# Patient Record
Sex: Male | Born: 1935
Health system: Southern US, Community
[De-identification: ages and names within clinical notes are randomized; demographics above are authoritative.]

## PROBLEM LIST (undated history)

## (undated) DIAGNOSIS — F329 Major depressive disorder, single episode, unspecified: Secondary | ICD-10-CM

## (undated) DIAGNOSIS — IMO0001 Reserved for inherently not codable concepts without codable children: Secondary | ICD-10-CM

## (undated) DIAGNOSIS — N400 Enlarged prostate without lower urinary tract symptoms: Secondary | ICD-10-CM

## (undated) DIAGNOSIS — G4700 Insomnia, unspecified: Secondary | ICD-10-CM

## (undated) DIAGNOSIS — C439 Malignant melanoma of skin, unspecified: Secondary | ICD-10-CM

## (undated) DIAGNOSIS — I10 Essential (primary) hypertension: Secondary | ICD-10-CM

## (undated) DIAGNOSIS — M1990 Unspecified osteoarthritis, unspecified site: Secondary | ICD-10-CM

## (undated) DIAGNOSIS — K219 Gastro-esophageal reflux disease without esophagitis: Secondary | ICD-10-CM

## (undated) DIAGNOSIS — G250 Essential tremor: Secondary | ICD-10-CM

## (undated) HISTORY — DX: Malignant melanoma of skin, unspecified: C43.9

## (undated) HISTORY — DX: Reserved for inherently not codable concepts without codable children: IMO0001

## (undated) HISTORY — DX: Insomnia, unspecified: G47.00

## (undated) HISTORY — DX: Major depressive disorder, single episode, unspecified: F32.9

## (undated) HISTORY — DX: Essential tremor: G25.0

## (undated) HISTORY — DX: Essential (primary) hypertension: I10

## (undated) HISTORY — DX: Gastro-esophageal reflux disease without esophagitis: K21.9

## (undated) HISTORY — DX: Unspecified osteoarthritis, unspecified site: M19.90

## (undated) HISTORY — DX: Benign prostatic hyperplasia without lower urinary tract symptoms: N40.0

---

## 1994-11-28 DIAGNOSIS — N400 Enlarged prostate without lower urinary tract symptoms: Secondary | ICD-10-CM

## 1994-11-28 DIAGNOSIS — F32A Depression, unspecified: Secondary | ICD-10-CM

## 1994-11-28 HISTORY — DX: Depression, unspecified: F32.A

## 1994-11-28 HISTORY — DX: Benign prostatic hyperplasia without lower urinary tract symptoms: N40.0

## 1999-10-05 LAB — HM COLONOSCOPY

## 2000-06-27 ENCOUNTER — Encounter: Payer: Self-pay | Admitting: Family Medicine

## 2000-06-27 LAB — CONVERTED CEMR LAB
PSA: 1.4 ng/mL
RBC count: 4.83 10*6/uL
TSH: 2.12 microintl units/mL
WBC, blood: 4.9 10*3/uL

## 2001-07-04 ENCOUNTER — Encounter: Payer: Self-pay | Admitting: Family Medicine

## 2001-07-04 LAB — CONVERTED CEMR LAB: TSH: 1.57 microintl units/mL

## 2001-11-16 ENCOUNTER — Encounter: Payer: Self-pay | Admitting: Family Medicine

## 2001-11-19 ENCOUNTER — Encounter: Payer: Self-pay | Admitting: Family Medicine

## 2001-11-28 HISTORY — PX: CATARACT EXTRACTION: SUR2

## 2002-07-04 ENCOUNTER — Encounter: Payer: Self-pay | Admitting: Family Medicine

## 2002-07-04 LAB — CONVERTED CEMR LAB
Blood Glucose, Fasting: 85 mg/dL
RBC count: 4.52 10*6/uL
WBC, blood: 4 10*3/uL

## 2003-03-26 ENCOUNTER — Encounter: Admission: RE | Admit: 2003-03-26 | Discharge: 2003-03-26 | Payer: Self-pay | Admitting: Internal Medicine

## 2003-03-26 ENCOUNTER — Encounter: Payer: Self-pay | Admitting: Internal Medicine

## 2004-11-11 ENCOUNTER — Ambulatory Visit: Payer: Self-pay | Admitting: Family Medicine

## 2004-12-08 ENCOUNTER — Ambulatory Visit: Payer: Self-pay | Admitting: Family Medicine

## 2004-12-08 LAB — CONVERTED CEMR LAB
PSA: 1.39 ng/mL
TSH: 2.07 microintl units/mL

## 2004-12-10 ENCOUNTER — Ambulatory Visit: Payer: Self-pay | Admitting: Family Medicine

## 2004-12-24 ENCOUNTER — Ambulatory Visit: Payer: Self-pay | Admitting: Family Medicine

## 2005-02-28 ENCOUNTER — Ambulatory Visit: Payer: Self-pay | Admitting: Family Medicine

## 2005-09-29 ENCOUNTER — Ambulatory Visit: Payer: Self-pay | Admitting: Family Medicine

## 2005-12-15 ENCOUNTER — Ambulatory Visit: Payer: Self-pay | Admitting: Family Medicine

## 2005-12-16 ENCOUNTER — Ambulatory Visit: Payer: Self-pay | Admitting: Family Medicine

## 2005-12-16 LAB — CONVERTED CEMR LAB
Blood Glucose, Fasting: 99 mg/dL
TSH: 1.61 microintl units/mL

## 2005-12-22 ENCOUNTER — Ambulatory Visit: Payer: Self-pay | Admitting: Family Medicine

## 2006-01-03 ENCOUNTER — Ambulatory Visit: Payer: Self-pay | Admitting: Family Medicine

## 2006-03-09 ENCOUNTER — Ambulatory Visit: Payer: Self-pay | Admitting: Family Medicine

## 2006-03-09 LAB — CONVERTED CEMR LAB
Blood Glucose, Fasting: 91 mg/dL
PSA: 1.73 ng/mL
TSH: 1.56 microintl units/mL

## 2006-12-13 ENCOUNTER — Ambulatory Visit: Payer: Self-pay | Admitting: Family Medicine

## 2006-12-13 LAB — CONVERTED CEMR LAB
ALT: 22 units/L (ref 0–40)
Alkaline Phosphatase: 62 units/L (ref 39–117)
CO2: 27 meq/L (ref 19–32)
Calcium: 9.1 mg/dL (ref 8.4–10.5)
Chloride: 106 meq/L (ref 96–112)
Cholesterol: 185 mg/dL (ref 0–200)
Glucose, Bld: 102 mg/dL — ABNORMAL HIGH (ref 70–99)
HDL: 50.4 mg/dL (ref >39.0)
LDL Cholesterol: 117 mg/dL — ABNORMAL HIGH (ref 0–99)
PSA: 2.31 ng/mL
PSA: 2.31 ng/mL (ref 0.10–4.00)
Potassium: 4 meq/L (ref 3.5–5.1)
Sodium: 141 meq/L (ref 135–145)
TSH: 1.51 microintl units/mL
TSH: 1.51 microintl units/mL (ref 0.35–5.50)
Total CHOL/HDL Ratio: 3.7
VLDL: 18 mg/dL (ref 0–40)

## 2006-12-21 ENCOUNTER — Ambulatory Visit: Payer: Self-pay | Admitting: Family Medicine

## 2006-12-25 ENCOUNTER — Ambulatory Visit: Payer: Self-pay | Admitting: Family Medicine

## 2007-01-02 ENCOUNTER — Ambulatory Visit: Payer: Self-pay | Admitting: Family Medicine

## 2007-03-22 ENCOUNTER — Ambulatory Visit: Payer: Self-pay | Admitting: Family Medicine

## 2007-04-11 ENCOUNTER — Telehealth (INDEPENDENT_AMBULATORY_CARE_PROVIDER_SITE_OTHER): Payer: Self-pay | Admitting: *Deleted

## 2007-08-30 ENCOUNTER — Ambulatory Visit: Payer: Self-pay | Admitting: Family Medicine

## 2007-11-01 ENCOUNTER — Telehealth: Payer: Self-pay | Admitting: Family Medicine

## 2007-11-12 ENCOUNTER — Telehealth: Payer: Self-pay | Admitting: Family Medicine

## 2007-12-11 ENCOUNTER — Ambulatory Visit: Payer: Self-pay | Admitting: Family Medicine

## 2007-12-11 DIAGNOSIS — F411 Generalized anxiety disorder: Secondary | ICD-10-CM

## 2007-12-21 ENCOUNTER — Encounter: Payer: Self-pay | Admitting: Family Medicine

## 2007-12-21 DIAGNOSIS — R7309 Other abnormal glucose: Secondary | ICD-10-CM

## 2007-12-24 DIAGNOSIS — N400 Enlarged prostate without lower urinary tract symptoms: Secondary | ICD-10-CM | POA: Insufficient documentation

## 2007-12-24 DIAGNOSIS — I10 Essential (primary) hypertension: Secondary | ICD-10-CM | POA: Insufficient documentation

## 2007-12-24 DIAGNOSIS — F39 Unspecified mood [affective] disorder: Secondary | ICD-10-CM | POA: Insufficient documentation

## 2007-12-26 ENCOUNTER — Ambulatory Visit: Payer: Self-pay | Admitting: Family Medicine

## 2007-12-26 LAB — CONVERTED CEMR LAB
ALT: 22 units/L (ref 0–53)
Albumin: 4 g/dL (ref 3.5–5.2)
Alkaline Phosphatase: 65 units/L (ref 39–117)
BUN: 20 mg/dL (ref 6–23)
CO2: 28 meq/L (ref 19–32)
Calcium: 9.4 mg/dL (ref 8.4–10.5)
Direct LDL: 117 mg/dL
GFR calc Af Amer: 84 mL/min
HDL: 62.8 mg/dL (ref >39.0)
Potassium: 4.6 meq/L (ref 3.5–5.1)
TSH: 2.31 microintl units/mL (ref 0.35–5.50)
Total Protein: 6.5 g/dL (ref 6.0–8.3)
Triglycerides: 49 mg/dL (ref 0–149)
VLDL: 10 mg/dL (ref 0–40)

## 2008-01-01 ENCOUNTER — Ambulatory Visit: Payer: Self-pay | Admitting: Family Medicine

## 2008-01-01 DIAGNOSIS — G250 Essential tremor: Secondary | ICD-10-CM | POA: Insufficient documentation

## 2008-01-17 ENCOUNTER — Ambulatory Visit: Payer: Self-pay | Admitting: Family Medicine

## 2008-01-18 ENCOUNTER — Encounter (INDEPENDENT_AMBULATORY_CARE_PROVIDER_SITE_OTHER): Payer: Self-pay | Admitting: *Deleted

## 2008-01-29 ENCOUNTER — Telehealth: Payer: Self-pay | Admitting: Family Medicine

## 2008-01-30 ENCOUNTER — Ambulatory Visit: Payer: Self-pay | Admitting: Family Medicine

## 2008-01-30 LAB — CONVERTED CEMR LAB
Basophils Relative: 0.5 % (ref 0.0–1.0)
Eosinophils Relative: 3.4 % (ref 0.0–5.0)
HCT: 43.5 % (ref 39.0–52.0)
Hemoglobin: 14.5 g/dL (ref 13.0–17.0)
Iron: 140 ug/dL (ref 42–165)
Lymphocytes Relative: 28.4 % (ref 12.0–46.0)
Monocytes Absolute: 0.4 10*3/uL (ref 0.2–0.7)
Neutro Abs: 2.2 10*3/uL (ref 1.4–7.7)
Neutrophils Relative %: 56.7 % (ref 43.0–77.0)
RDW: 12.7 % (ref 11.5–14.6)
WBC: 3.8 10*3/uL — ABNORMAL LOW (ref 4.5–10.5)

## 2008-02-11 ENCOUNTER — Telehealth: Payer: Self-pay | Admitting: Family Medicine

## 2008-02-27 ENCOUNTER — Telehealth: Payer: Self-pay | Admitting: Family Medicine

## 2008-04-24 ENCOUNTER — Ambulatory Visit: Payer: Self-pay | Admitting: Family Medicine

## 2008-05-06 ENCOUNTER — Telehealth: Payer: Self-pay | Admitting: Family Medicine

## 2008-08-27 ENCOUNTER — Telehealth: Payer: Self-pay | Admitting: Family Medicine

## 2008-08-27 ENCOUNTER — Ambulatory Visit: Payer: Self-pay | Admitting: Family Medicine

## 2008-12-04 ENCOUNTER — Ambulatory Visit: Payer: Self-pay | Admitting: Family Medicine

## 2008-12-16 ENCOUNTER — Telehealth: Payer: Self-pay | Admitting: Family Medicine

## 2008-12-29 ENCOUNTER — Ambulatory Visit: Payer: Self-pay | Admitting: Family Medicine

## 2008-12-29 DIAGNOSIS — T7840XA Allergy, unspecified, initial encounter: Secondary | ICD-10-CM | POA: Insufficient documentation

## 2009-01-01 ENCOUNTER — Ambulatory Visit: Payer: Self-pay | Admitting: Family Medicine

## 2009-01-02 LAB — CONVERTED CEMR LAB
ALT: 26 units/L (ref 0–53)
AST: 22 units/L (ref 0–37)
Basophils Absolute: 0 10*3/uL (ref 0.0–0.1)
Basophils Relative: 0.2 % (ref 0.0–3.0)
Bilirubin, Direct: 0.1 mg/dL (ref 0.0–0.3)
CO2: 0 meq/L — CL (ref 19–32)
Chloride: 117 meq/L — ABNORMAL HIGH (ref 96–112)
Creatinine, Ser: 1.1 mg/dL (ref 0.4–1.5)
Glucose, Bld: 100 mg/dL — ABNORMAL HIGH (ref 70–99)
HDL: 45.4 mg/dL (ref >39.0)
Hemoglobin: 13.6 g/dL (ref 13.0–17.0)
Lymphocytes Relative: 23.6 % (ref 12.0–46.0)
MCHC: 34.6 g/dL (ref 30.0–36.0)
Monocytes Relative: 11.9 % (ref 3.0–12.0)
Neutrophils Relative %: 60.2 % (ref 43.0–77.0)
PSA: 2.17 ng/mL (ref 0.10–4.00)
RBC: 4.18 M/uL — ABNORMAL LOW (ref 4.22–5.81)
Sodium: 147 meq/L — ABNORMAL HIGH (ref 135–145)
TSH: 1.16 microintl units/mL (ref 0.35–5.50)
Total Bilirubin: 0.9 mg/dL (ref 0.3–1.2)
Total CHOL/HDL Ratio: 4.6
Total Protein: 6.3 g/dL (ref 6.0–8.3)
VLDL: 25 mg/dL (ref 0–40)

## 2009-01-08 ENCOUNTER — Ambulatory Visit: Payer: Self-pay | Admitting: Family Medicine

## 2009-01-15 ENCOUNTER — Ambulatory Visit: Payer: Self-pay | Admitting: Family Medicine

## 2009-01-15 ENCOUNTER — Encounter (INDEPENDENT_AMBULATORY_CARE_PROVIDER_SITE_OTHER): Payer: Self-pay | Admitting: *Deleted

## 2009-01-15 LAB — CONVERTED CEMR LAB
OCCULT 1: NEGATIVE
OCCULT 2: NEGATIVE
OCCULT 3: NEGATIVE

## 2009-01-15 LAB — FECAL OCCULT BLOOD, GUAIAC: Fecal Occult Blood: NEGATIVE

## 2009-02-11 ENCOUNTER — Telehealth: Payer: Self-pay | Admitting: Family Medicine

## 2009-02-12 ENCOUNTER — Telehealth: Payer: Self-pay | Admitting: Family Medicine

## 2009-02-19 ENCOUNTER — Ambulatory Visit: Payer: Self-pay | Admitting: Family Medicine

## 2009-03-24 ENCOUNTER — Ambulatory Visit: Payer: Self-pay | Admitting: Family Medicine

## 2009-05-20 ENCOUNTER — Ambulatory Visit: Payer: Self-pay | Admitting: Family Medicine

## 2009-05-20 DIAGNOSIS — G4700 Insomnia, unspecified: Secondary | ICD-10-CM | POA: Insufficient documentation

## 2009-05-20 DIAGNOSIS — K219 Gastro-esophageal reflux disease without esophagitis: Secondary | ICD-10-CM | POA: Insufficient documentation

## 2009-05-20 DIAGNOSIS — R55 Syncope and collapse: Secondary | ICD-10-CM | POA: Insufficient documentation

## 2009-05-20 HISTORY — DX: Insomnia, unspecified: G47.00

## 2009-06-03 ENCOUNTER — Ambulatory Visit: Payer: Self-pay | Admitting: Family Medicine

## 2009-06-18 ENCOUNTER — Ambulatory Visit: Payer: Self-pay | Admitting: Family Medicine

## 2009-07-21 ENCOUNTER — Ambulatory Visit: Payer: Self-pay | Admitting: Family Medicine

## 2009-07-21 ENCOUNTER — Telehealth: Payer: Self-pay | Admitting: Family Medicine

## 2009-08-19 ENCOUNTER — Ambulatory Visit: Payer: Self-pay | Admitting: Family Medicine

## 2009-08-21 ENCOUNTER — Telehealth (INDEPENDENT_AMBULATORY_CARE_PROVIDER_SITE_OTHER): Payer: Self-pay

## 2009-08-24 ENCOUNTER — Encounter: Payer: Self-pay | Admitting: Family Medicine

## 2009-09-18 ENCOUNTER — Telehealth: Payer: Self-pay | Admitting: Internal Medicine

## 2009-11-19 ENCOUNTER — Telehealth: Payer: Self-pay | Admitting: Family Medicine

## 2009-12-27 ENCOUNTER — Emergency Department: Payer: Self-pay | Admitting: Emergency Medicine

## 2009-12-28 ENCOUNTER — Telehealth: Payer: Self-pay | Admitting: Family Medicine

## 2010-01-18 ENCOUNTER — Telehealth: Payer: Self-pay | Admitting: Family Medicine

## 2010-03-02 ENCOUNTER — Telehealth: Payer: Self-pay | Admitting: Family Medicine

## 2010-03-30 ENCOUNTER — Ambulatory Visit: Payer: Self-pay | Admitting: Family Medicine

## 2010-03-30 LAB — CONVERTED CEMR LAB
ALT: 17 units/L (ref 0–53)
AST: 18 units/L (ref 0–37)
Alkaline Phosphatase: 65 units/L (ref 39–117)
BUN: 21 mg/dL (ref 6–23)
Bilirubin, Direct: 0.1 mg/dL (ref 0.0–0.3)
Calcium: 9 mg/dL (ref 8.4–10.5)
Cholesterol: 210 mg/dL — ABNORMAL HIGH (ref 0–200)
Creatinine, Ser: 0.9 mg/dL (ref 0.4–1.5)
Creatinine,U: 148.1 mg/dL
Eosinophils Relative: 3.9 % (ref 0.0–5.0)
GFR calc non Af Amer: 86.98 mL/min (ref >60)
Lymphocytes Relative: 27.9 % (ref 12.0–46.0)
Monocytes Absolute: 0.5 10*3/uL (ref 0.1–1.0)
Monocytes Relative: 12.1 % — ABNORMAL HIGH (ref 3.0–12.0)
Neutrophils Relative %: 55.3 % (ref 43.0–77.0)
Platelets: 231 10*3/uL (ref 150.0–400.0)
Total Bilirubin: 0.7 mg/dL (ref 0.3–1.2)
Total CHOL/HDL Ratio: 4
VLDL: 21.4 mg/dL (ref 0.0–40.0)
WBC: 4.5 10*3/uL (ref 4.5–10.5)

## 2010-04-01 ENCOUNTER — Ambulatory Visit: Payer: Self-pay | Admitting: Family Medicine

## 2010-05-03 ENCOUNTER — Ambulatory Visit: Payer: Self-pay | Admitting: Family Medicine

## 2010-06-08 ENCOUNTER — Ambulatory Visit: Payer: Self-pay | Admitting: Internal Medicine

## 2010-06-11 ENCOUNTER — Telehealth: Payer: Self-pay | Admitting: Family Medicine

## 2010-06-24 ENCOUNTER — Telehealth: Payer: Self-pay | Admitting: Internal Medicine

## 2010-07-01 ENCOUNTER — Encounter: Payer: Self-pay | Admitting: Internal Medicine

## 2010-07-06 ENCOUNTER — Encounter (INDEPENDENT_AMBULATORY_CARE_PROVIDER_SITE_OTHER): Payer: Self-pay | Admitting: *Deleted

## 2010-07-12 ENCOUNTER — Ambulatory Visit: Payer: Self-pay | Admitting: Internal Medicine

## 2010-07-12 DIAGNOSIS — M720 Palmar fascial fibromatosis [Dupuytren]: Secondary | ICD-10-CM

## 2010-08-31 ENCOUNTER — Ambulatory Visit: Payer: Self-pay | Admitting: Internal Medicine

## 2010-12-10 ENCOUNTER — Ambulatory Visit: Admit: 2010-12-10 | Payer: Self-pay | Admitting: Internal Medicine
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
## 2010-12-28 NOTE — Progress Notes (Signed)
Summary: refill request for alprazolam  Phone Note Refill Request Message from:  Fax from Pharmacy  Refills Requested: Medication #1:  alprazolam 0.5 mg Faxed request from [facility], this is no longer on med list.  [phone].  Initial call taken by: Lowella Petties CMA,  March 02, 2010 8:54 AM  Follow-up for Phone Call        Called to [place]. Follow-up by: Lowella Petties CMA,  March 02, 2010 12:09 PM    New/Updated Medications: ALPRAZOLAM 0.5 MG TABS (ALPRAZOLAM) one tab by mouth twice a day sparingly as needed. Prescriptions: ALPRAZOLAM 0.5 MG TABS (ALPRAZOLAM) one tab by mouth twice a day sparingly as needed.  #30 x 0   Entered and Authorized by:   Shaune Leeks MD   Signed by:   Lowella Petties CMA on 03/02/2010   Method used:   Telephoned to ...       [facility]* (retail)       [address]       [place], [place]  [address]       Ph: [phone]       Fax: [phone]   RxID:   [id number]

## 2010-12-28 NOTE — Progress Notes (Signed)
Summary: needs written script for mail order  Phone Note Refill Request Call back at Home Phone [phone] Message from:  Patient  Refills Requested: Medication #1:  FEXOFENADINE HCL 180 MG TABS one tab by mouth once daily.. Pt is asking for a 90 day written script to send to mail order, please call when ready.  Initial call taken by: Lowella Petties CMA,  January 18, 2010 12:01 PM  Follow-up for Phone Call        [facility] advising pt ok to pick up script. Follow-up by: Lowella Petties CMA,  January 18, 2010 2:12 PM    Prescriptions: FEXOFENADINE HCL 180 MG TABS (FEXOFENADINE HCL) one tab by mouth once daily.  #90 x 3   Entered and Authorized by:   Shaune Leeks MD   Signed by:   Shaune Leeks MD on 01/18/2010   Method used:   Print then Give to Patient   RxID:   [id number]

## 2010-12-28 NOTE — Letter (Signed)
Summary: Nadara Eaton letter  [place] at [facility]  [address] [place], [place] [address]   Phone: [phone]  Fax: [phone]       07/06/2010 MRN: [id number]  [facility] [place] [address] [place], [place]  [address]  Dear Mr. Toft,  [facility] [place], and [place] announce the retirement of Arta Silence, M.D., from full-time practice at the [facility] office effective May 27, 2010 and his plans of returning part-time.  It is important to Dr. Hetty Ely and to our practice that you understand that [facility] has seven physicians in our office for your health care needs.  We will continue to offer the same exceptional care that you have today.    Dr. Hetty Ely has spoken to many of you about his plans for retirement and returning part-time in the fall.   We will continue to work with you through the transition to schedule appointments for you in the office and meet the high standards that [facility] is committed to.   Again, it is with great pleasure that we share the news that Dr. Hetty Ely will return to [facility] in October of 2011 with a reduced schedule.    If you have any questions, or would like to request an appointment with one of our physicians, please call us at [phone] and press the option for Scheduling an appointment.  We take pleasure in providing you with excellent patient care and look forward to seeing you at your next office visit.  Our [facility] Physicians are:  Tillman Abide, M.D. Laurita Quint, M.D. Roxy Manns, M.D. Kerby Nora, M.D. Hannah Beat, M.D. Ruthe Mannan, M.D. We proudly welcomed Raechel Ache, M.D. and Eustaquio Boyden, M.D. to the practice in July/August 2011.  Sincerely,   [facility]

## 2010-12-28 NOTE — Assessment & Plan Note (Signed)
Summary: skin growth on shoulder/dlo   Vital Signs:  Patient profile:   77 year old male Weight:      143.50 pounds Temp:     97.7 degrees F oral Pulse rate:   72 / minute Pulse rhythm:   regular BP sitting:   100 / 60  (left arm) Cuff size:   regular  Vitals Entered By: Sydell Axon LPN (May 03, 864 2:44 PM) CC: Check skin growth on right shoulder   History of Present Illness: Here to check a lesion that broke out on the right shoulder at the very top  mid clavicular line that came up  a few days ago. He scraped off a scab and it bled. It is now getting more nml appearing again.  Problems Prior to Update: 1)  Health Maintenance Exam  (ICD-V70.0) 2)  Finger Sprain, R Little  (ICD-842.10) 3)  Gerd  (ICD-530.81) 4)  Insomnia, Chronic  (ICD-307.42) 5)  Syncope  (ICD-780.2) 6)  Allergy  (ICD-995.3) 7)  Special Screening Malig Neoplasms Other Sites  (ICD-V76.49) 8)  Tremor, Essential  (ICD-333.1) 9)  Special Screening Malignant Neoplasm of Prostate  (ICD-V76.44) 10)  Other Abnormal Glucose  (ICD-790.29) 11)  Hypercholesterolemia  (ICD-272.0) 12)  Benign Prostatic Hypertrophy  (ICD-600.00) 13)  Hypertension  (ICD-401.9) 14)  Depression  (ICD-311) 15)  Seasonal Affective Disorder  (ICD-296.90) 16)  Anxiety  (ICD-300.00)  Medications Prior to Update: 1)  Paroxetine Hcl 30 Mg Tabs (Paroxetine Hcl) .... Take 1  Tablet By Mouth Single Dose Daily By Mouth 2)  Adult Aspirin Low Strength 81 Mg  Tbdp (Aspirin) .[name].. 1 Daily 3)  Fish Oil 1000 Mg  Caps (Omega-3 Fatty Acids) .[name].. 1 Daily 4)  Multivitamins   Tabs (Multiple Vitamin) .[name].. 1 Daily 5)  Patanol 0.1 % Soln (Olopatadine Hcl) .... One Drop Eaqch Eye Twice Daily 6-8hrs Apart. 6)  Propranolol Hcl 20 Mg Tabs (Propranolol Hcl) .... One Tab By Mouth Two Times A Day 7)  Melatonin 5 Mg Tabs (Melatonin) .... Use As Needed As Needed 8)  Cardura 1 Mg Tabs (Doxazosin Mesylate) .... One Tab By Mouth Once Daily 9)  Fexofenadine Hcl 180 Mg  Tabs (Fexofenadine Hcl) .... One Tab By Mouth Once Daily. 10)  Alprazolam 0.5 Mg Tabs (Alprazolam) .... One Tab By Mouth Twice A Day Sparingly As Needed.  Allergies: No Known Drug Allergies  Physical Exam  Skin:  Maculopapular double lesion with resolving abraded appearance approx 2mm x 5mm.    Impression & Recommendations:  Problem # 1:  NEOPLASM, SKIN, UNCERTAIN BEHAVIOR (ICD-238.2) Assessment New Of top of mid right shoulder area midclavicular line...allow a few mos to heal. If still there by Oct, make appt for excision.  Complete Medication List: 1)  Paroxetine Hcl 30 Mg Tabs (Paroxetine hcl) .... Take 1  tablet by mouth single dose daily by mouth 2)  Adult Aspirin Low Strength 81 Mg Tbdp (Aspirin) .[name].. 1 daily 3)  Fish Oil 1000 Mg Caps (Omega-3 fatty acids) .[name].. 1 daily 4)  Patanol 0.1 % Soln (Olopatadine hcl) .... One drop eaqch eye twice daily 6-8hrs apart. 5)  Propranolol Hcl 20 Mg Tabs (Propranolol hcl) .... One tab by mouth two times a day 6)  Melatonin 5 Mg Tabs (Melatonin) .... Use as needed as needed 7)  Cardura 1 Mg Tabs (Doxazosin mesylate) .... One tab by mouth once daily 8)  Fexofenadine Hcl 180 Mg Tabs (Fexofenadine hcl) .... One tab by mouth once daily. 9)  Alprazolam  0.5 Mg Tabs (Alprazolam) .... One tab by mouth twice a day sparingly as needed.  Current Allergies (reviewed today): No known allergies

## 2010-12-28 NOTE — Progress Notes (Signed)
Summary: regarding mysoline  Phone Note Call from Patient Call back at Home Phone [phone]   Caller: Patient Summary of Call: Pt is taking one half of a mysoline tablet at bedtime.  The tremors have come back- they stopped when he started this.  Also, he feels some light headedness.  He is asking if he should increase the mysoline dose, or is there something else that he can try.  [facility]. Initial call taken by: Lowella Petties CMA,  June 24, 2010 3:52 PM  Follow-up for Phone Call        Message left. Need to clarify this note.  Is his tremor better on the 1/2 mysoline? If not, I would increase it to the full tab daily unless he is having trouble taking it. Occ it can cause some dizziness that will often improve over a few days Cindee Salt MD  June 25, 2010 7:55 AM   Additional Follow-up for Phone Call Additional follow up Details #1::        While he was taking the propranolol the tremors were controlled, when he started the mysoline they were still controlled, but this week they came back.  Advised pt he can increase dose, but he is asking if he should take a whole one at once or take one half twice a day.  Lowella Petties CMA  June 25, 2010 11:10 AM  [facility] Janee Morn CMA  June 25, 2010 1:09 PM  probably better to try 1/2 two times a day as long as it doesn't sedate him Cindee Salt MD  June 25, 2010 12:22 PM      Additional Follow-up for Phone Call Additional follow up Details #2::    Advised pt.         Lowella Petties CMA  June 25, 2010 3:51 PM

## 2010-12-28 NOTE — Assessment & Plan Note (Signed)
Summary: flu/[place]/alc  Nurse Visit   Allergies: No Known Drug Allergies  Orders Added: 1)  Flu Vaccine 18yrs + MEDICARE PATIENTS [Q2039] 2)  Administration Flu vaccine - MCR [G0008]   Flu Vaccine Consent Questions     Do you have a history of severe allergic reactions to this vaccine? no    Any prior history of allergic reactions to egg and/or gelatin? no    Do you have a sensitivity to the preservative Thimersol? no    Do you have a past history of Guillan-Barre Syndrome? no    Do you currently have an acute febrile illness? no    Have you ever had a severe reaction to latex? no    Vaccine information given and explained to patient? yes    Are you currently pregnant? no    Lot Number:[id number]   Exp Date:05/28/2011   Site Given  Left Deltoid IM

## 2010-12-28 NOTE — Therapy (Signed)
[facility]: [facility]   Imported By: Lanelle Bal 07/20/2010 14:01:49  _____________________________________________________________________  External Attachment:    Type:   Image     Comment:   External Document  Appended Document: [facility] recommended amplification to help hearing and mask tinnitus

## 2010-12-28 NOTE — Progress Notes (Signed)
Summary: needs something for tremors  Phone Note Call from Patient Call back at Home Phone [phone]   Caller: Patient Summary of Call: Pt was seen on 7/12.  He was told to stop his propranolol because of night time hallucinations and call if his tremors got worse.  His nights are better but the tremors have come back full force.  Dr Alphonsus Sias had mentioned in his note from that day that he would prescribe mysoline if the tremors did come back.  Pt requests that this be called to [facility]. Initial call taken by: Lowella Petties CMA,  June 11, 2010 3:56 PM    New/Updated Medications: MYSOLINE 50 MG TABS (PRIMIDONE) 1/2 tab by mouth qhs Prescriptions: MYSOLINE 50 MG TABS (PRIMIDONE) 1/2 tab by mouth qhs  #30 x 0   Entered and Authorized by:   Ruthe Mannan MD   Signed by:   Ruthe Mannan MD on 06/11/2010   Method used:   Electronically to        [facility]* (retail)       [address]       [place], [place]  [address]       Ph: [phone]       Fax: [phone]   RxID:   [id number]   Appended Document: needs something for tremors Patient advised as instructed via telephone.

## 2010-12-28 NOTE — Assessment & Plan Note (Signed)
Summary: CPX/DLO   Vital Signs:  Patient profile:   77 year old male Weight:      143.25 pounds BMI:     22.86 Temp:     98.3 degrees F oral Pulse rate:   64 / minute Pulse rhythm:   regular BP sitting:   110 / 78  (left arm) Cuff size:   regular  Vitals Entered By: Sydell Axon LPN (Apr 02, 3663 2:03 PM) CC: 30 Minute checkup, had a colonoscopy 11/00   History of Present Illness: Pt here for Comp Exam. His probs are allergies for which he uses Patanol. He then had a spoarained right little finger which is contracting.   Preventive Screening-Counseling & Management  Alcohol-Tobacco     Alcohol drinks/day: 2     Alcohol type: wine x2 at night     Smoking Status: never     Passive Smoke Exposure: no  Caffeine-Diet-Exercise     Caffeine use/day: 0     Does Patient Exercise: yes     Type of exercise: walks     Times/week: 5  Problems Prior to Update: 1)  Finger Sprain, R Little  (ICD-842.10) 2)  Gerd  (ICD-530.81) 3)  Insomnia, Chronic  (ICD-307.42) 4)  Syncope  (ICD-780.2) 5)  Allergy  (ICD-995.3) 6)  Special Screening Malig Neoplasms Other Sites  (ICD-V76.49) 7)  Tremor, Essential  (ICD-333.1) 8)  Special Screening Malignant Neoplasm of Prostate  (ICD-V76.44) 9)  Other Abnormal Glucose  (ICD-790.29) 10)  Hypercholesterolemia  (ICD-272.0) 11)  Benign Prostatic Hypertrophy  (ICD-600.00) 12)  Hypertension  (ICD-401.9) 13)  Depression  (ICD-311) 14)  Seasonal Affective Disorder  (ICD-296.90) 15)  Anxiety  (ICD-300.00)  Medications Prior to Update: 1)  Paroxetine Hcl 30 Mg Tabs (Paroxetine Hcl) .... Take 1  Tablet By Mouth Single Dose Daily By Mouth 2)  Adult Aspirin Low Strength 81 Mg  Tbdp (Aspirin) .[name].. 1 Daily 3)  Fish Oil 1000 Mg  Caps (Omega-3 Fatty Acids) .[name].. 1 Daily 4)  Multivitamins   Tabs (Multiple Vitamin) .[name].. 1 Daily 5)  Patanol 0.1 % Soln (Olopatadine Hcl) .... One Drop Eaqch Eye Twice Daily 6-8hrs Apart. 6)  Nasonex 50 Mcg/act Susp (Mometasone  Furoate) .... One Inhalation Each Nostril Two Times A Day As Needed 7)  Zyrtec Allergy 10 Mg Tabs (Cetirizine Hcl) .[name].. 1 Daily As Needed By Mouth 8)  Propranolol Hcl 20 Mg Tabs (Propranolol Hcl) .... One Tab By Mouth Two Times A Day 9)  Melatonin 5 Mg Tabs (Melatonin) .... Use As Needed As Needed 10)  Vitamin C 1000 Mg Tabs (Ascorbic Acid) .[name].. 1 Daily By Mouth 11)  Procapanol  10mg  .... For Lowering Bad Cholesterol 12)  Cardura 1 Mg Tabs (Doxazosin Mesylate) .... One Tab By Mouth Once Daily 13)  Doxazosin Mesylate 2 Mg Tabs (Doxazosin Mesylate) .[name].. 1 Tab At Bedtime 14)  Fexofenadine Hcl 180 Mg Tabs (Fexofenadine Hcl) .... One Tab By Mouth Once Daily. 15)  Alprazolam 0.5 Mg Tabs (Alprazolam) .... One Tab By Mouth Twice A Day Sparingly As Needed.  Allergies: No Known Drug Allergies  Past History:  Past Medical History: Last updated: 12/21/2007 Depression::(1996) Hypertension:(1996) Benign prostatic hypertrophy:(1996)  Past Surgical History: Last updated: 01/08/2009 PSYCH ADMIT ([place]) DEPRESSION 1996 PSYCHIATRIC ADMIT: SELF REFERRAL// ANXIETY AND DEPRESSION:(12/1994) COLONOSCOPY// SECOND TO HEMATOCHEZIA// REPORTEDLY NORMAL:(09/1999) ETT-- ABNORMAL III AVF AND LAT. LEADS:(09/20000) STRESS CARDIOLITE:(07/1999) CATARRACT EXTRACTION (DR Nelle Don) (2003) ABDOMENAL ULTRASOUND , CAROTID ULTRASOUND; ABI'S (SELF DONE) NORMAL:(05/27/2002)  Family History: Last updated:  12/21/2007 Father: DECEASED AT 83 YOA CANCER OF STOMACH Mother: DECEASED 73 YOA ALZHEIMERS// DEPRESSION // LONG HISTORY Siblings: NONE CV: NEGATIVE HBP: + SELF DM: NEGATIVE GOUT/ARTHRITIS: PROSTATE CANCER: + STOMACH FATHER BREAST/OVARIAN/UTERINE CANCER: NEGATIVE COLON CANCER: + STOMACH FATHER DEPRESSION: + SELF + MOTHER ETOH/DRUG ABUSE: NEGATIVE OTHER : NEGATIVE STROKE  Social History: Last updated: 12/21/2007 Marital Status: Married LIVES WITH WIFE Children: 3 BOYS Occupation: RETIRED   DISABILITY//PARTIME JOBS WORKING AT [employer] NOW// DISABLITY LEFT INDEX   Risk Factors: Alcohol Use: 2 (04/01/2010) Caffeine Use: 0 (04/01/2010) Exercise: yes (04/01/2010)  Risk Factors: Smoking Status: never (04/01/2010) Passive Smoke Exposure: no (04/01/2010)  Review of Systems General:  Denies chills, fatigue, fever, sweats, weakness, and weight loss. Eyes:  Denies blurring, discharge, and eye pain; allergies. ENT:  Complains of ringing in ears; denies decreased hearing and ear discharge; hissing. CV:  Denies chest pain or discomfort, fainting, fatigue, palpitations, shortness of breath with exertion, swelling of feet, and swelling of hands. Resp:  Denies cough, shortness of breath, and wheezing. GI:  Denies abdominal pain, bloody stools, change in bowel habits, constipation, dark tarry stools, diarrhea, indigestion, loss of appetite, nausea, vomiting, vomiting blood, and yellowish skin color. GU:  Denies discharge, dysuria, nocturia, and urinary frequency; chronic prostatitis, occas symptomatic. Derm:  Denies dryness, itching, and rash. Neuro:  Complains of tremors; denies numbness, poor balance, and tingling.  Physical Exam  General:  Well-developed,well-nourished,in no acute distress; alert,appropriate and cooperative throughout examination Head:  Normocephalic and atraumatic without obvious abnormalities. No apparent alopecia or balding. Sinuses NT. Eyes:  Conjunctiva clear bilaterally.  Ears:  External ear exam shows no significant lesions or deformities.  Otoscopic examination reveals clear canals, tympanic membranes are intact bilaterally without bulging, retraction, inflammation or discharge. Hearing is grossly normal bilaterally. Nose:  External nasal examination shows no deformity or inflammation. Nasal mucosa are pink and moist without lesions or exudates. Mouth:  Oral mucosa and oropharynx without lesions or exudates.  Teeth in reasonable repair. Neck:  No  deformities, masses, or tenderness noted. Chest Wall:  No deformities, masses, tenderness or gynecomastia noted. Breasts:  No masses or gynecomastia noted Lungs:  Normal respiratory effort, chest expands symmetrically. Lungs are clear to auscultation, no crackles or wheezes. Heart:  Normal rate and regular rhythm. S1 and S2 normal without gallop, murmur, click, rub or other extra sounds. Abdomen:  Bowel sounds positive,abdomen soft and non-tender without masses, organomegaly or hernias noted. Rectal:  No external abnormalities noted. Normal sphincter tone. No rectal masses or tenderness. G neg. Genitalia:  Testes bilaterally descended without nodularity, tenderness or masses. No scrotal masses or lesions. No penis lesions or urethral discharge. Prostate:  Prostate gland firm and smooth, no enlargement, nodularity, tenderness, mass, asymmetry or induration. 10-20 gms. Msk:  No deformity or scoliosis noted of thoracic or lumbar spine.   Pulses:  R and L carotid,radial,femoral,dorsalis pedis and posterior tibial pulses are full and equal bilaterally Extremities:  No clubbing, cyanosis, edema, or deformity noted with normal full range of motion of all joints except mild flexion deformity of right little finger PIP joint. Neurologic:  No cranial nerve deficits noted. Station and gait are normal. Plantar reflexes are down-going bilaterally. DTRs are symmetrical throughout. Sensory, motor and coordinative functions appear intact. Skin:  Intact without suspicious lesions or rashes Cervical Nodes:  No lymphadenopathy noted Inguinal Nodes:  No significant adenopathy Psych:  Cognition and judgment appear intact. Alert and cooperative with normal attention span and concentration. No apparent delusions, illusions, hallucinations  Impression & Recommendations:  Problem # 1:  HEALTH MAINTENANCE EXAM (ICD-V70.0) Assessment Comment Only  Reviewed preventive care protocols, scheduled due services, and updated  immunizations.  Problem # 2:  FINGER SPRAIN, R LITTLE (ICD-842.10) Assessment: Unchanged Discussed self physical therapy aggressively.  Problem # 3:  GERD (ICD-530.81) Assessment: Unchanged  Stable.  Diagnostics Reviewed:  Discussed lifestyle modifications, diet, antacids/medications, and preventive measures. Handout provided.   Problem # 4:  INSOMNIA, CHRONIC (ICD-307.42) Assessment: Unchanged Well controlled.  Problem # 5:  ALLERGY (ICD-995.3) Assessment: Unchanged Basically affects his eyes. Uses Patanol with good results.  Problem # 6:  TREMOR, ESSENTIAL (ICD-333.1) Assessment: Unchanged Stable on Propranolol.  Problem # 7:  OTHER ABNORMAL GLUCOSE (ICD-790.29) Assessment: Unchanged  Euglycemic today.  Labs Reviewed: Creat: 0.9 (03/30/2010)     Problem # 8:  HYPERCHOLESTEROLEMIA (ICD-272.0) Adequate. Try to watch fatty food intake. Labs Reviewed: SGOT: 18 (03/30/2010)   SGPT: 17 (03/30/2010)   HDL:47.90 (03/30/2010), 45.4 (01/01/2009)  LDL:DEL (01/01/2009), DEL (12/26/2007)  Chol:210 (03/30/2010), 208 (01/01/2009)  Trig:107.0 (03/30/2010), 127 (01/01/2009)  Problem # 9:  BENIGN PROSTATIC HYPERTROPHY (ICD-600.00) Assessment: Unchanged Stable PSA and exam. Sxs stable.  Problem # 10:  HYPERTENSION (ICD-401.9) Assessment: Unchanged Well controlled. The following medications were removed from the medication list:    Doxazosin Mesylate 2 Mg Tabs (Doxazosin mesylate) .[name]... 1 tab at bedtime His updated medication list for this problem includes:    Propranolol Hcl 20 Mg Tabs (Propranolol hcl) ..... One tab by mouth two times a day    Cardura 1 Mg Tabs (Doxazosin mesylate) ..... One tab by mouth once daily  BP today: 110/78 Prior BP: 118/78 (08/19/2009)  Labs Reviewed: K+: 4.8 (03/30/2010) Creat: : 0.9 (03/30/2010)   Chol: 210 (03/30/2010)   HDL: 47.90 (03/30/2010)   LDL: DEL (01/01/2009)   TG: 107.0 (03/30/2010)  Problem # 11:  SEASONAL AFFECTIVE DISORDER  (ICD-296.90) Assessment: Unchanged Well controlled No problems this winter. Discussed continued trmt.  Complete Medication List: 1)  Paroxetine Hcl 30 Mg Tabs (Paroxetine hcl) .... Take 1  tablet by mouth single dose daily by mouth 2)  Adult Aspirin Low Strength 81 Mg Tbdp (Aspirin) .[name].. 1 daily 3)  Fish Oil 1000 Mg Caps (Omega-3 fatty acids) .[name].. 1 daily 4)  Multivitamins Tabs (Multiple vitamin) .[name].. 1 daily 5)  Patanol 0.1 % Soln (Olopatadine hcl) .... One drop eaqch eye twice daily 6-8hrs apart. 6)  Propranolol Hcl 20 Mg Tabs (Propranolol hcl) .... One tab by mouth two times a day 7)  Melatonin 5 Mg Tabs (Melatonin) .... Use as needed as needed 8)  Cardura 1 Mg Tabs (Doxazosin mesylate) .... One tab by mouth once daily 9)  Fexofenadine Hcl 180 Mg Tabs (Fexofenadine hcl) .... One tab by mouth once daily. 10)  Alprazolam 0.5 Mg Tabs (Alprazolam) .... One tab by mouth twice a day sparingly as needed.  Patient Instructions: 1)  RTC as needed O/W one year. 2)  Discuss pneumovax next appt.  Current Allergies (reviewed today): No known allergies

## 2010-12-28 NOTE — Assessment & Plan Note (Signed)
Summary: CHECK HANDS   Vital Signs:  Patient profile:   77 year old male Weight:      144 pounds Temp:     98.0 degrees F oral Pulse rate:   68 / minute Pulse rhythm:   regular BP sitting:   108 / 60  (left arm) Cuff size:   large  Vitals Entered By: Mervin Hack CMA Duncan Dull) (July 12, 2010 11:24 AM) CC: check hands   History of Present Illness: Tried the mysoline didn't seem to help the tremor--- and made him very edgy Has gone back on propranolol two times a day  Discussed the sustained release--had been on this in the past  Mood has been good Clearly has a seasonal component---often just takes 15mg  of paroxetine in summer, then back to full 30 mg in the winter  concerned about Duputryen's disease in hands Got information online and from friend 4th finger on left, 5th on right No functional problems    Preventive Screening-Counseling & Management  Alcohol-Tobacco     Smoking Status: never  Allergies: No Known Drug Allergies  Past History:  Past medical, surgical, family and social histories (including risk factors) reviewed for relevance to current acute and chronic problems.  Past Medical History: Reviewed history from 06/08/2010 and no changes required. Depression::(1996) Hypertension:(1996) Benign prostatic hypertrophy:(1996) Essential tremor  Past Surgical History: Reviewed history from 01/08/2009 and no changes required. PS[place]CH ADMIT ([place]) DEPRESSION 1996 PSYCHIATRIC ADMIT: SELF REFERRAL// ANXIETY AND DEPRESSION:(12/1994) COLONOSCOPY// SECOND TO HEMATOCHEZIA// REPORTEDLY NORMAL:(09/1999) ETT-- ABNORMAL III AVF AND LAT. LEADS:(09/20000) STRESS CARDIOLITE:(07/1999) CATARRACT EXTRACTION (DR Nelle Don) (2003) ABDOMENAL ULTRASOUND , CAROTID ULTRASOUND; ABI'S (SELF DONE) NORMAL:(05/27/2002)  Family History: Reviewed history from 12/21/2007 and no changes required. Father: DECEASED AT 42 YOA CANCER OF STOMACH Mother: DECEASED 63 YOA ALZHEIMERS//  DEPRESSION // LONG HISTORY Siblings: NONE CV: NEGATIVE HBP: + SELF DM: NEGATIVE GOUT/ARTHRITIS: PROSTATE CANCER: + STOMACH FATHER BREAST/OVARIAN/UTERINE CANCER: NEGATIVE COLON CANCER: + STOMACH FATHER DEPRESSION: + SELF + MOTHER ETOH/DRUG ABUSE: NEGATIVE OTHER : NEGATIVE STROKE  Social History: Reviewed history from 12/21/2007 and no changes required. Retired--Insurance Programmer, systems. Then worked at [employer] course for a while Married---3 sons Never Smoked Alcohol use-occ wine  Has living will. Wife, the son Dorinda Hill is his health care POA. Would accept resuscitation attempts but no prolonged ventilation. Would consider feeding tube  Review of Systems       sleep is still not great---occ uses OTC med voiding okay  Physical Exam  General:  alert and normal appearance.   Msk:  moderate contracture at PIP on right 5th finger Lacks considerable extension Good grip Has flexor tendon nodule  left 4th finger with very slight contracture only Psych:  normally interactive, good eye contact, not anxious appearing, and not depressed appearing.     Impression & Recommendations:  Problem # 1:  DUPUYTREN'S CONTRACTURE (ICD-728.6) Assessment New little to no functional problems will defer referral wants Dr Amanda Pea if things get worse  Problem # 2:  TREMOR, ESSENTIAL (ICD-333.1) Assessment: Comment Only mysoline didn't help and was not tolerable will go back to propranolol but use the extended release  Problem # 3:  TINNITUS (ICD-388.30) Assessment: Comment Only was given vitamins to take he is forgoing this due to the $40 monthly cost  Problem # 4:  DEPRESSION (ICD-311) Assessment: Unchanged stable on meds  His updated medication list for this problem includes:    Paroxetine Hcl 30 Mg Tabs (Paroxetine hcl) .[name]... Take 1  by mouth once daily  Complete Medication List: 1)  Paroxetine Hcl 30 Mg Tabs (Paroxetine hcl) .... Take 1  by mouth once daily 2)  Cardura 1 Mg  Tabs (Doxazosin mesylate) .... One tab by mouth once daily 3)  Fexofenadine Hcl 180 Mg Tabs (Fexofenadine hcl) .... One tab by mouth once daily. 4)  Adult Aspirin Low Strength 81 Mg Tbdp (Aspirin) .... Take 1 by mouth once daily 5)  Fish Oil 1000 Mg Caps (Omega-3 fatty acids) .... Take 1 by mouth once daily 6)  Patanol 0.1 % Soln (Olopatadine hcl) .... One drop each eye twice daily every 6-8hrs apart. 7)  Propranolol Hcl Cr 60 Mg Xr24h-cap (Propranolol hcl) .[name].. 1 tab daily to control tremors  Patient Instructions: 1)  Please schedule a follow-up appointment in 6 months for physical Prescriptions: PROPRANOLOL HCL CR 60 MG XR24H-CAP (PROPRANOLOL HCL) 1 tab daily to control tremors  #30 x 12   Entered and Authorized by:   Cindee Salt MD   Signed by:   Cindee Salt MD on 07/12/2010   Method used:   Electronically to        [facility]* (retail)       [address]       [place], [place]  [address]       Ph: [phone]       Fax: [phone]   RxID:   [id number]   Current Allergies (reviewed today): No known allergies

## 2010-12-28 NOTE — Progress Notes (Signed)
Summary: Pt went to [facility] ER for laceration  Phone Note Call from Patient Call back at [phone]   Caller: Patient Call For: Shaune Leeks MD Summary of Call: Pt went to [facility] ER on Sunday night 12/27/09 with laceration of finger. Pt is OK but was told because of his insurance had to notify his physician he went to ER.   Initial call taken by: Lewanda Rife LPN,  December 28, 2009 5:42 PM  Follow-up for Phone Call        If he has sutures or needs followup, I imagine he should come here. Follow-up by: Shaune Leeks MD,  December 28, 2009 6:01 PM  Additional Follow-up for Phone Call Additional follow up Details #1::        Spoke to patient and was informed that he did not have sutures and was not told to follow-up with his PCP. Patient just wanted this noted on his records that he called to advise that he went to the ER. Additional Follow-up by: Sydell Axon LPN,  December 28, 2009 6:04 PM

## 2011-01-24 ENCOUNTER — Telehealth: Payer: Self-pay | Admitting: Internal Medicine

## 2011-01-25 ENCOUNTER — Telehealth: Payer: Self-pay | Admitting: Family Medicine

## 2011-02-03 NOTE — Progress Notes (Signed)
Summary: needs new written scripts for mail order  Phone Note Refill Request Call back at Home Phone [phone] Message from:  Patient  Refills Requested: Medication #1:  PAROXETINE HCL 30 MG TABS Take 1  by mouth once daily  Medication #2:  CARDURA 1 MG TABS one tab by mouth once daily  Medication #3:  PROPRANOLOL HCL CR 60 MG XR24H-CAP 1 tab daily to control tremors. Pt needs written 90 day scripts for new mail order.  Please call when ready.  Initial call taken by: Lowella Petties CMA, AAMA,  January 24, 2011 10:56 AM  Follow-up for Phone Call        Rx written Follow-up by: Cindee Salt MD,  January 24, 2011 1:22 PM  Additional Follow-up for Phone Call Additional follow up Details #1::        left message on machine that rx ready for pick-up  Additional Follow-up by: DeShannon Smith CMA Duncan Dull),  January 24, 2011 2:27 PM    New/Updated Medications: PAROXETINE HCL 30 MG TABS (PAROXETINE HCL) Take 1  by mouth once daily CARDURA 1 MG TABS (DOXAZOSIN MESYLATE) one tab by mouth once daily Prescriptions: PROPRANOLOL HCL CR 60 MG XR24H-CAP (PROPRANOLOL HCL) 1 tab daily to control tremors  #90 x 3   Entered and Authorized by:   Cindee Salt MD   Signed by:   Cindee Salt MD on 01/24/2011   Method used:   Print then Give to Patient   RxID:   [id number] CARDURA 1 MG TABS (DOXAZOSIN MESYLATE) one tab by mouth once daily  #90 x 3   Entered and Authorized by:   Cindee Salt MD   Signed by:   Cindee Salt MD on 01/24/2011   Method used:   Print then Give to Patient   RxID:   [id number] PAROXETINE HCL 30 MG TABS (PAROXETINE HCL) Take 1  by mouth once daily  #90 x 3   Entered and Authorized by:   Cindee Salt MD   Signed by:   Cindee Salt MD on 01/24/2011   Method used:   Print then Give to Patient   RxID:   [id number]   Appended Document: needs new written scripts for mail order Pt said was going to leave medicine at  the local pharmacy and does not want these rxs for mail order.  Appended Document: needs new written scripts for mail order calling patient to see if he wants rx's sent locally? left message on machine at home for patient to return my call.

## 2011-02-03 NOTE — Progress Notes (Signed)
Summary: Patient upset   Phone Note Outgoing Call   Call placed by: Mervin Hack CMA Duncan Dull),  January 25, 2011 3:36 PM Call placed to: Patient Summary of Call: called pt to followup on an append sent by Lewanda Rife, pt had requested 90day rx for his medications, which was done and I left message for the pt to let him know they were ready for pick-up. Today Rena stated pt doesn't want mail order rx's , he just get them locally because Dr.Tower will not refill his wife's [place] for mail-order. Per pt (who was very upset) stated Dr.Tower won't filll rx because it can be stolen out the mailbox and then we would be liable? pt states mail order saves them alot of money and it doesn't make sense that Dr.Tower won't fill, then pt asked that Remus Loffler be sent locally and with refills, and again his wife was denied. I advised pt I could still send his rx's to [facility] but pt asked that I mail them to him because he's not set up with them yet, this would be their 1st order so they need hard copies. Rx's put in the mail today for pt. Initial call taken by: Mervin Hack CMA Duncan Dull),  January 25, 2011 3:45 PM  Follow-up for Phone Call        not sure what I can do  Let Dr Milinda Antis know of his concerns Follow-up by: Cindee Salt MD,  January 25, 2011 5:28 PM

## 2011-02-05 ENCOUNTER — Encounter: Payer: Self-pay | Admitting: Internal Medicine

## 2011-04-04 ENCOUNTER — Ambulatory Visit (INDEPENDENT_AMBULATORY_CARE_PROVIDER_SITE_OTHER): Payer: Medicare Other | Admitting: Internal Medicine

## 2011-04-04 ENCOUNTER — Encounter: Payer: Self-pay | Admitting: Internal Medicine

## 2011-04-04 VITALS — BP 131/69 | HR 55 | Temp 97.8°F | Ht 66.0 in | Wt 139.0 lb

## 2011-04-04 DIAGNOSIS — Z0000 Encounter for general adult medical examination without abnormal findings: Secondary | ICD-10-CM

## 2011-04-04 DIAGNOSIS — G252 Other specified forms of tremor: Secondary | ICD-10-CM

## 2011-04-04 DIAGNOSIS — G250 Essential tremor: Secondary | ICD-10-CM

## 2011-04-04 DIAGNOSIS — F411 Generalized anxiety disorder: Secondary | ICD-10-CM

## 2011-04-04 DIAGNOSIS — E7800 Pure hypercholesterolemia, unspecified: Secondary | ICD-10-CM

## 2011-04-04 DIAGNOSIS — F39 Unspecified mood [affective] disorder: Secondary | ICD-10-CM

## 2011-04-04 DIAGNOSIS — N400 Enlarged prostate without lower urinary tract symptoms: Secondary | ICD-10-CM

## 2011-04-04 DIAGNOSIS — T7840XA Allergy, unspecified, initial encounter: Secondary | ICD-10-CM

## 2011-04-04 DIAGNOSIS — I10 Essential (primary) hypertension: Secondary | ICD-10-CM

## 2011-04-04 LAB — CBC WITH DIFFERENTIAL/PLATELET
Basophils Relative: 0.8 % (ref 0.0–3.0)
Eosinophils Relative: 3.1 % (ref 0.0–5.0)
HCT: 39.2 % (ref 39.0–52.0)
Hemoglobin: 13.4 g/dL (ref 13.0–17.0)
Lymphs Abs: 1.2 10*3/uL (ref 0.7–4.0)
MCV: 94.8 fl (ref 78.0–100.0)
Monocytes Absolute: 0.6 10*3/uL (ref 0.1–1.0)
RBC: 4.14 Mil/uL — ABNORMAL LOW (ref 4.22–5.81)
WBC: 4.6 10*3/uL (ref 4.5–10.5)

## 2011-04-04 LAB — BASIC METABOLIC PANEL
BUN: 24 mg/dL — ABNORMAL HIGH (ref 6–23)
Calcium: 8.8 mg/dL (ref 8.4–10.5)
GFR: 87.88 mL/min (ref >60.00)
Glucose, Bld: 87 mg/dL (ref 70–99)
Potassium: 4 mEq/L (ref 3.5–5.1)

## 2011-04-04 LAB — HEPATIC FUNCTION PANEL: Total Bilirubin: 0.9 mg/dL (ref 0.3–1.2)

## 2011-04-04 MED ORDER — OLOPATADINE HCL 0.1 % OP SOLN: 1.0000 [drp] | Freq: Two times a day (BID) | OPHTHALMIC | Status: DC

## 2011-04-04 NOTE — Progress Notes (Signed)
Subjective:    Patient ID: Rodney Branch, male    DOB: April 30, 1931, 77 y.o.   MRN: [id number]  HPI Had allergy testing by Dr Nira Retort Corinda Gubler No apparent positive findings Recommended fexofenadine, patanol, flonase Got steroid shot IM and it really helped  No other active complaints  Still with tremor Not as bad while on the propranolol  Sold house and moved at wife's insistence --last November Now in townhome at [facility] Had trouble with mood then--but doing better now  Still bowls regularly, walks on treadmill, some weights  Current outpatient prescriptions:aspirin 81 MG tablet, Take 81 mg by mouth daily.  , Disp: , Rfl: ;  doxazosin (CARDURA) 1 MG tablet, Take 1 mg by mouth daily.  , Disp: , Rfl: ;  fexofenadine (ALLEGRA) 180 MG tablet, Take 180 mg by mouth daily.  , Disp: , Rfl: ;  fish oil-omega-3 fatty acids 1000 MG capsule, Take by mouth daily.  , Disp: , Rfl:  olopatadine (PATANOL) 0.1 % ophthalmic solution, Place 1 drop into both eyes 2 (two) times daily. One drop each eye twice daily every 6-8hours apart , Disp: , Rfl: ;  PARoxetine (PAXIL) 30 MG tablet, Take 30 mg by mouth daily.  , Disp: , Rfl: ;  propranolol (INDERAL LA) 60 MG 24 hr capsule, Take 60 mg by mouth daily. To control tremors , Disp: , Rfl:   Past Medical History  Diagnosis Date  . Depression 1996    Psych Admit ([place]) 1996  . Hypertension 1996  . Benign prostatic hypertrophy 1996  . Tremor, essential   . Psychiatric hospitalization 12/1994    self referral/anxiety depression    Past Surgical History  Procedure Date  . Cataract extraction 2003    Dr. Nelle Don    Family History  Problem Relation Age of Onset  . Depression Mother 64    alzheimers//long history  . Cancer Father 27    cancer of stomach  . Alzheimer's disease    . Alcohol abuse Neg Hx   . Diabetes Neg Hx   . Stroke Neg Hx     History   Social History  . Marital Status: Married    Spouse Name: N/A    Number of Children:  3  . Years of Education: N/A   Occupational History  . Biomedical engineer     Retired  . golf course     [employer] for a while  .     Social History Main Topics  . Smoking status: Never Smoker   . Smokeless tobacco: Never Used  . Alcohol Use: 0.0 oz/week     occasonally drinks wine  . Drug Use: No  . Sexually Active: Not on file   Other Topics Concern  . Not on file   Social History Narrative   Has living willWife, then son Dorinda Hill, is his health care POAWould accept resuscitation attempts but no prolonged ventilationWould consider feeding tube   Review of Systems  Constitutional: Negative for fatigue and unexpected weight change.       Retired from part time work at Systems analyst course Wears seat belt  HENT: Positive for hearing loss and tinnitus. Negative for dental problem.   Eyes: Negative for visual disturbance.       No diplopia or focal vision loss Occ gets sense of "small checkered flags" in his vision---if he is rushing around. Lasts 10 minutes  Respiratory: Negative for cough, chest tightness and shortness of breath.   Cardiovascular:  Negative for chest pain, palpitations and leg swelling.  Gastrointestinal: Negative for nausea, vomiting, abdominal pain, constipation and blood in stool.       Occ heartburn--uses tums prn and it helps (only with tomato products)  Genitourinary: Negative for dysuria, urgency, decreased urine volume and difficulty urinating.       No sex---marital thing  Musculoskeletal: Positive for arthralgias. Negative for back pain and joint swelling.       Right shoulder --saw Dr Thomasena Edis for injection  Skin: Negative for rash.       No suspicious areas  Neurological: Positive for tremors. Negative for dizziness, weakness, light-headedness, numbness and headaches.  Hematological: Negative for adenopathy. Does not bruise/bleed easily.  Psychiatric/Behavioral: Positive for sleep disturbance. Negative for dysphoric mood. The patient is not  nervous/anxious.        Chronic sleep problems---actually sleeps too long. Discussed       Objective:   Physical Exam  Constitutional: He is oriented to person, place, and time. He appears well-developed and well-nourished. No distress.  HENT:  Head: Normocephalic and atraumatic.  Right Ear: External ear normal.  Left Ear: External ear normal.  Mouth/Throat: Oropharynx is clear and moist. No oropharyngeal exudate.       TMs normal  Eyes: Conjunctivae and EOM are normal. Pupils are equal, round, and reactive to light.       Fundi benign  Neck: Normal range of motion. Neck supple. No thyromegaly present.  Cardiovascular: Normal rate, regular rhythm and intact distal pulses.  Exam reveals no gallop.   Murmur heard.      Gr2/6 late systolic murmur at apex (?diastolic)  Pulmonary/Chest: Effort normal and breath sounds normal. No respiratory distress. He has no wheezes. He has no rales.  Abdominal: Soft. He exhibits no mass. There is no tenderness.  Musculoskeletal: He exhibits no edema and no tenderness.       Decreased abduction of right shoulder  Lymphadenopathy:    He has no cervical adenopathy.  Neurological: He is alert and oriented to person, place, and time. He exhibits normal muscle tone.       Normal strength Very slight resting tremor in hands  Skin: Skin is warm. No rash noted.  Psychiatric: He has a normal mood and affect. His behavior is normal. Judgment and thought content normal.          Assessment & Plan:

## 2011-07-25 ENCOUNTER — Telehealth: Payer: Self-pay | Admitting: *Deleted

## 2011-07-25 MED ORDER — ZOLPIDEM TARTRATE 5 MG PO TABS: 5.0000 mg | ORAL_TABLET | Freq: Every evening | ORAL | Status: DC | PRN

## 2011-07-25 NOTE — Telephone Encounter (Signed)
Please make sure he knows not to take it every day----it stops working, can build up and cause side effects and is very dependence provoking. I expect this supply of 90 to last at least 6 months---I will not refill it earlier

## 2011-07-25 NOTE — Telephone Encounter (Signed)
Pt used to take zolpidem and has been taking otc sleep aids.  He would like to go back to zolpidem.  He takes his wife's from time to time and it works well.  He would like a written 90 day script. He will pick up when ready.

## 2011-07-25 NOTE — Telephone Encounter (Signed)
Spoke with patient and advised results   

## 2011-08-18 ENCOUNTER — Ambulatory Visit (INDEPENDENT_AMBULATORY_CARE_PROVIDER_SITE_OTHER): Payer: Medicare Other | Admitting: *Deleted

## 2011-08-18 DIAGNOSIS — Z23 Encounter for immunization: Secondary | ICD-10-CM

## 2011-09-22 ENCOUNTER — Ambulatory Visit (INDEPENDENT_AMBULATORY_CARE_PROVIDER_SITE_OTHER): Payer: Medicare Other | Admitting: Internal Medicine

## 2011-09-22 ENCOUNTER — Encounter: Payer: Self-pay | Admitting: Internal Medicine

## 2011-09-22 VITALS — BP 99/44 | HR 64 | Temp 98.0°F | Ht 66.0 in | Wt 143.0 lb

## 2011-09-22 DIAGNOSIS — H5316 Psychophysical visual disturbances: Secondary | ICD-10-CM

## 2011-09-22 DIAGNOSIS — Z79899 Other long term (current) drug therapy: Secondary | ICD-10-CM

## 2011-09-22 DIAGNOSIS — F39 Unspecified mood [affective] disorder: Secondary | ICD-10-CM

## 2011-09-22 DIAGNOSIS — F0390 Unspecified dementia without behavioral disturbance: Secondary | ICD-10-CM | POA: Insufficient documentation

## 2011-09-22 DIAGNOSIS — R441 Visual hallucinations: Secondary | ICD-10-CM

## 2011-09-22 DIAGNOSIS — G250 Essential tremor: Secondary | ICD-10-CM

## 2011-09-22 NOTE — Patient Instructions (Signed)
Call if you can't wait till the follow up visit to go back on something for your tremors

## 2011-09-22 NOTE — Assessment & Plan Note (Signed)
Having vivid dreams and some actual visual hallucinations at night I doubt this is propranolol but it is possible---will stop this  Discussed the possibility of Lewy body dementia Clearly has some degree of dementia--though mild

## 2011-09-22 NOTE — Assessment & Plan Note (Signed)
Ongoing problems Will try off the propranolol  If worsens, will restart the mysoline 50 daily or bid

## 2011-09-22 NOTE — Progress Notes (Signed)
  Subjective:    Patient ID: Rodney Branch, male    DOB: 07/05/1931, 77 y.o.   MRN: [id number]  HPI Wondering about the propranolol Concerned about depression, nightmares, hallucinations (actually vivid dreams---and people, bears, etc), dry mouth  Has stopped the zolpidem  Still has terrible shaking Trouble holding glass--even on the med Had been on primidone in past---just had some sedation with this  No sig dizziness   Some trouble with short term memory He pays all the bills No worrisome symptoms to suggest dementia  Ongoing mood swings Wonders about the propranolol for that as well Tries to spend time outside---finds his depression is worse without lots of sunlight  Current Outpatient Prescriptions on File Prior to Visit  Medication Sig Dispense Refill  . aspirin 81 MG tablet Take 81 mg by mouth daily.        [name] doxazosin (CARDURA) 1 MG tablet Take 1 mg by mouth daily.        . fexofenadine (ALLEGRA) 180 MG tablet Take 180 mg by mouth daily.        [name] PARoxetine (PAXIL) 30 MG tablet Take 30 mg by mouth daily.        . propranolol (INDERAL LA) 60 MG 24 hr capsule Take 60 mg by mouth daily. To control tremors         No Known Allergies  Past Medical History  Diagnosis Date  . Depression 1996    Psych Admit ([place]) 1996  . Hypertension 1996  . Benign prostatic hypertrophy 1996  . Tremor, essential   . Psychiatric hospitalization 12/1994    self referral/anxiety depression    Past Surgical History  Procedure Date  . Cataract extraction 2003    Dr. Nelle Don    Family History  Problem Relation Age of Onset  . Depression Mother 20    alzheimers//long history  . Cancer Father 71    cancer of stomach  . Alzheimer's disease    . Alcohol abuse Neg Hx   . Diabetes Neg Hx   . Stroke Neg Hx     History   Social History  . Marital Status: Married    Spouse Name: N/A    Number of Children: 3  . Years of Education: N/A   Occupational History  . Electronics engineer     Retired  . [employer]     retired also   Social History Main Topics  . Smoking status: Never Smoker   . Smokeless tobacco: Never Used  . Alcohol Use: 0.0 oz/week     occasonally drinks wine  . Drug Use: No  . Sexually Active: Not on file   Other Topics Concern  . Not on file   Social History Narrative   Has living willWife, then son Dorinda Hill, is his health care POAWould accept resuscitation attempts but no prolonged ventilationWould consider feeding tube   Review of Systems Appetite is fine Weight stable     Objective:   Physical Exam  Constitutional: He is oriented to person, place, and time.  Neurological: He is alert and oriented to person, place, and time.       President-- "[employer], [place]" [phone] [id number] Recall 0/3  Psychiatric: He has a normal mood and affect. His behavior is normal. Judgment and thought content normal.          Assessment & Plan:

## 2011-09-22 NOTE — Assessment & Plan Note (Signed)
Ongoing problems with this Will continue the paxil Good that he is off the zolpidem

## 2011-09-22 NOTE — Assessment & Plan Note (Signed)
Mild at most Will need to follow over time

## 2011-09-27 ENCOUNTER — Telehealth: Payer: Self-pay | Admitting: *Deleted

## 2011-09-27 NOTE — Telephone Encounter (Signed)
Actually, I was going to try it 25-50 daily to bid See if he wants to try this again and we will try at lowest reasonable dose

## 2011-09-27 NOTE — Telephone Encounter (Signed)
Patient is asking if there is another drug he can take for the tremors? Per pt he feels so much better being off the propranlol, but now the tremors are back. Please advise.

## 2011-09-27 NOTE — Telephone Encounter (Signed)
Probably the best choice is to try the mysoline (primodone) at low dose (like 1/2 or 1 of the 50mg  tab tid) again All the meds will have some risk of side effects

## 2011-09-28 NOTE — Telephone Encounter (Signed)
Yes he does want to try this again, please advise

## 2011-09-29 ENCOUNTER — Other Ambulatory Visit: Payer: Self-pay | Admitting: *Deleted

## 2011-09-29 MED ORDER — PRIMIDONE 50 MG PO TABS: 25.0000 mg | ORAL_TABLET | Freq: Two times a day (BID) | ORAL | Status: DC | PRN

## 2011-09-29 NOTE — Telephone Encounter (Signed)
Spoke with patient and advised results   

## 2011-09-29 NOTE — Telephone Encounter (Signed)
Okay--that is correct

## 2011-09-29 NOTE — Telephone Encounter (Signed)
Patient came in office this morning and wanted to get started on the rx so I sent in based on your instructions, 1/2 tab of the 50 mg once to twice daily, patient understood these instructions, I advised pt that if anything changes after I speak with Dr.Letvak I would call him back.

## 2011-10-05 ENCOUNTER — Other Ambulatory Visit: Payer: Self-pay | Admitting: *Deleted

## 2011-10-05 MED ORDER — DOXAZOSIN MESYLATE 1 MG PO TABS: 1.0000 mg | ORAL_TABLET | Freq: Every day | ORAL | Status: DC

## 2011-10-18 ENCOUNTER — Encounter: Payer: Self-pay | Admitting: Internal Medicine

## 2011-10-18 ENCOUNTER — Ambulatory Visit (INDEPENDENT_AMBULATORY_CARE_PROVIDER_SITE_OTHER): Payer: Medicare Other | Admitting: Internal Medicine

## 2011-10-18 DIAGNOSIS — F0390 Unspecified dementia without behavioral disturbance: Secondary | ICD-10-CM

## 2011-10-18 DIAGNOSIS — R441 Visual hallucinations: Secondary | ICD-10-CM

## 2011-10-18 DIAGNOSIS — H5316 Psychophysical visual disturbances: Secondary | ICD-10-CM

## 2011-10-18 DIAGNOSIS — G250 Essential tremor: Secondary | ICD-10-CM

## 2011-10-18 NOTE — Assessment & Plan Note (Signed)
Not worse and maybe better If early Lewy body---worsening cognitive or more hallucinations---will consider rivastigmine

## 2011-10-18 NOTE — Assessment & Plan Note (Signed)
Satisfied with the primidone

## 2011-10-18 NOTE — Progress Notes (Signed)
  Subjective:    Patient ID: Rodney Branch, male    DOB: Mar 02, 1931, 77 y.o.   MRN: [id number]  HPI Stopped the propranolol Using the primidone for tremor--this is giving reasonable relief of his tremors  Hasn't had any hallucinations since stopping the propranolol Memory may be slightly better but not strikingly  Mostly short term memory Clearly not worse per wife  Current Outpatient Prescriptions on File Prior to Visit  Medication Sig Dispense Refill  . aspirin 81 MG tablet Take 81 mg by mouth daily.        [name] doxazosin (CARDURA) 1 MG tablet Take 1 tablet (1 mg total) by mouth daily.  90 tablet  3  . fexofenadine (ALLEGRA) 180 MG tablet Take 180 mg by mouth daily.        [name] olopatadine (PATANOL) 0.1 % ophthalmic solution Place 1 drop into both eyes 2 (two) times daily.        [name] PARoxetine (PAXIL) 30 MG tablet Take 30 mg by mouth daily.        . primidone (MYSOLINE) 50 MG tablet Take 0.5 tablets (25 mg total) by mouth 2 (two) times daily as needed.  60 tablet  1    No Known Allergies  Past Medical History  Diagnosis Date  . Depression 1996    Psych Admit ([place]) 1996  . Hypertension 1996  . Benign prostatic hypertrophy 1996  . Tremor, essential   . Psychiatric hospitalization 12/1994    self referral/anxiety depression    Past Surgical History  Procedure Date  . Cataract extraction 2003    Dr. Nelle Don    Family History  Problem Relation Age of Onset  . Depression Mother 28    alzheimers//long history  . Cancer Father 50    cancer of stomach  . Alzheimer's disease    . Alcohol abuse Neg Hx   . Diabetes Neg Hx   . Stroke Neg Hx     History   Social History  . Marital Status: Married    Spouse Name: N/A    Number of Children: 3  . Years of Education: N/A   Occupational History  . Biomedical engineer     Retired  . [employer]     retired also   Social History Main Topics  . Smoking status: Never Smoker   . Smokeless tobacco: Never Used    . Alcohol Use: 0.0 oz/week     occasonally drinks wine  . Drug Use: No  . Sexually Active: Not on file   Other Topics Concern  . Not on file   Social History Narrative   Has living willWife, then son Dorinda Hill, is his health care POAWould accept resuscitation attempts but no prolonged ventilationWould consider feeding tube   Review of Systems Sleeps well Appetite is great     Objective:   Physical Exam  Constitutional: He appears well-developed and well-nourished. No distress.  Psychiatric: He has a normal mood and affect. His behavior is normal. Judgment and thought content normal.          Assessment & Plan:

## 2011-10-18 NOTE — Assessment & Plan Note (Signed)
Resolved off the propranolol Hopefully will stay away

## 2011-11-15 ENCOUNTER — Other Ambulatory Visit: Payer: Self-pay | Admitting: Internal Medicine

## 2011-12-26 ENCOUNTER — Other Ambulatory Visit: Payer: Self-pay | Admitting: *Deleted

## 2011-12-26 MED ORDER — PAROXETINE HCL 30 MG PO TABS: 30.0000 mg | ORAL_TABLET | Freq: Every day | ORAL | Status: DC

## 2011-12-26 NOTE — Telephone Encounter (Signed)
rx faxed to pharmacy manually to [facility]

## 2012-01-12 ENCOUNTER — Other Ambulatory Visit: Payer: Self-pay | Admitting: Internal Medicine

## 2012-01-12 MED ORDER — ZOLPIDEM TARTRATE 5 MG PO TABS: 5.0000 mg | ORAL_TABLET | Freq: Every evening | ORAL | Status: DC | PRN

## 2012-01-12 NOTE — Telephone Encounter (Signed)
Okay #30 x 0 This med can be associated with unusual nighttime phenomenon which he doesn't need with his issues (like nightmares and hallucinations, sort of) He should limit this medication to no more than once or twice a week

## 2012-01-12 NOTE — Telephone Encounter (Signed)
rx called into pharmacy Spoke with pt about usage of medication, he states he does take it every night only 1/2 tab, because his wife's dog sleeps with them and he snores and he asked his wife to put the dog in another room and she told him that he can go sleep in another room or leave. Per pt the dog is old and won't be around long.

## 2012-01-12 NOTE — Telephone Encounter (Signed)
Pt came by and stated that he needs a refill on Zolpidem 5 mg. [facility] is using [facility] [place] now and wanted to know if we could send that in for him.

## 2012-01-14 NOTE — Telephone Encounter (Signed)
This should last for 2 months then Will not give refills as it is a concerning med at his age and with mild cognitive issues

## 2012-01-27 HISTORY — PX: DUPUYTREN CONTRACTURE RELEASE: SHX1478

## 2012-02-14 ENCOUNTER — Other Ambulatory Visit: Payer: Self-pay | Admitting: Internal Medicine

## 2012-03-09 ENCOUNTER — Other Ambulatory Visit: Payer: Self-pay | Admitting: *Deleted

## 2012-03-09 MED ORDER — ZOLPIDEM TARTRATE 5 MG PO TABS: 5.0000 mg | ORAL_TABLET | Freq: Every evening | ORAL | Status: DC | PRN

## 2012-03-09 NOTE — Telephone Encounter (Signed)
[facility]axed request [facility] [place] is on your desk.

## 2012-03-09 NOTE — Telephone Encounter (Signed)
Okay #30 x 0 

## 2012-03-09 NOTE — Telephone Encounter (Signed)
rx called into pharmacy

## 2012-04-10 ENCOUNTER — Ambulatory Visit (INDEPENDENT_AMBULATORY_CARE_PROVIDER_SITE_OTHER): Payer: Medicare Other | Admitting: Internal Medicine

## 2012-04-10 ENCOUNTER — Encounter: Payer: Self-pay | Admitting: Internal Medicine

## 2012-04-10 VITALS — BP 112/70 | HR 75 | Temp 98.1°F | Ht 66.0 in | Wt 148.0 lb

## 2012-04-10 DIAGNOSIS — Z1211 Encounter for screening for malignant neoplasm of colon: Secondary | ICD-10-CM

## 2012-04-10 DIAGNOSIS — F0390 Unspecified dementia without behavioral disturbance: Secondary | ICD-10-CM

## 2012-04-10 DIAGNOSIS — F329 Major depressive disorder, single episode, unspecified: Secondary | ICD-10-CM

## 2012-04-10 DIAGNOSIS — N400 Enlarged prostate without lower urinary tract symptoms: Secondary | ICD-10-CM

## 2012-04-10 DIAGNOSIS — I10 Essential (primary) hypertension: Secondary | ICD-10-CM

## 2012-04-10 DIAGNOSIS — F3289 Other specified depressive episodes: Secondary | ICD-10-CM

## 2012-04-10 DIAGNOSIS — R7309 Other abnormal glucose: Secondary | ICD-10-CM

## 2012-04-10 DIAGNOSIS — G250 Essential tremor: Secondary | ICD-10-CM

## 2012-04-10 DIAGNOSIS — E7800 Pure hypercholesterolemia, unspecified: Secondary | ICD-10-CM

## 2012-04-10 DIAGNOSIS — Z0000 Encounter for general adult medical examination without abnormal findings: Secondary | ICD-10-CM

## 2012-04-10 LAB — BASIC METABOLIC PANEL
BUN: 23 mg/dL (ref 6–23)
Calcium: 9.2 mg/dL (ref 8.4–10.5)
Chloride: 109 mEq/L (ref 96–112)
Creatinine, Ser: 0.9 mg/dL (ref 0.4–1.5)
GFR: 82.29 mL/min (ref >60.00)

## 2012-04-10 LAB — HEPATIC FUNCTION PANEL
ALT: 19 U/L (ref 0–53)
AST: 21 U/L (ref 0–37)
Alkaline Phosphatase: 62 U/L (ref 39–117)
Bilirubin, Direct: 0 mg/dL (ref 0.0–0.3)
Total Bilirubin: 0.6 mg/dL (ref 0.3–1.2)

## 2012-04-10 LAB — CBC WITH DIFFERENTIAL/PLATELET
Basophils Relative: 0.5 % (ref 0.0–3.0)
Eosinophils Relative: 2.7 % (ref 0.0–5.0)
Lymphocytes Relative: 23.2 % (ref 12.0–46.0)
MCV: 95.1 fl (ref 78.0–100.0)
Monocytes Relative: 14.4 % — ABNORMAL HIGH (ref 3.0–12.0)
Neutrophils Relative %: 59.2 % (ref 43.0–77.0)
Platelets: 238 10*3/uL (ref 150.0–400.0)
RBC: 4.41 Mil/uL (ref 4.22–5.81)
WBC: 4.4 10*3/uL — ABNORMAL LOW (ref 4.5–10.5)

## 2012-04-10 LAB — LIPID PANEL
HDL: 55.1 mg/dL (ref >39.00)
LDL Cholesterol: 113 mg/dL — ABNORMAL HIGH (ref 0–99)
Total CHOL/HDL Ratio: 4
VLDL: 26.2 mg/dL (ref 0.0–40.0)

## 2012-04-10 LAB — TSH: TSH: 1.61 u[IU]/mL (ref 0.35–5.50)

## 2012-04-10 MED ORDER — ZOLPIDEM TARTRATE 5 MG PO TABS: 5.0000 mg | ORAL_TABLET | Freq: Every evening | ORAL | Status: DC | PRN

## 2012-04-10 NOTE — Assessment & Plan Note (Signed)
Doing well Will check stool immunoassay only No PSA

## 2012-04-10 NOTE — Assessment & Plan Note (Signed)
Mild without evidence of progression No hallucinations with this---only from propranolol

## 2012-04-10 NOTE — Progress Notes (Signed)
Subjective:    Patient ID: Rodney Branch, male    DOB: [birth year], 78 y.o.   MRN: [id number]  HPI Here for physical Did have surgery on hand in March---on vitamin B6 for this  Continues to have mild short term memory loss No apparent progression No hallucinations except while on propranolol Independent with all ADLs and instrumental ADLs---drives, etc  Mood a bit of a problem when the weather was cold, etc Now feels better  Voiding okay  Current Outpatient Prescriptions on File Prior to Visit  Medication Sig Dispense Refill  . aspirin 81 MG tablet Take 81 mg by mouth daily.        [name] doxazosin (CARDURA) 1 MG tablet Take 1 tablet (1 mg total) by mouth daily.  90 tablet  3  . fexofenadine (ALLEGRA) 180 MG tablet Take 180 mg by mouth daily.        [name] PARoxetine (PAXIL) 30 MG tablet Take 1 tablet (30 mg total) by mouth daily.  90 tablet  3  . primidone (MYSOLINE) 50 MG tablet TAKE 0.5 TABLETS (25 MG TOTAL) BY MOUTH 2 (TWO) TIMES DAILY AS NEEDED.  60 tablet  1  . zolpidem (AMBIEN) 5 MG tablet Take 1 tablet (5 mg total) by mouth at bedtime as needed for sleep.  30 tablet  0    Allergies  Allergen Reactions  . Propranolol     Hallucinations (resolved upon cessation)  . Zolpidem     May have caused or added to hallucinations    Past Medical History  Diagnosis Date  . Depression 1996    Psych Admit ([place]) 1996  . Hypertension 1996  . Benign prostatic hypertrophy 1996  . Tremor, essential   . Psychiatric hospitalization 12/1994    self referral/anxiety depression    Past Surgical History  Procedure Date  . Cataract extraction 2003    Dr. Nelle Don  . Dupuytren contracture release 3/13    Dr Butler Denmark    Family History  Problem Relation Age of Onset  . Depression Mother 59    alzheimers//long history  . Cancer Father 15    cancer of stomach  . Alzheimer's disease    . Alcohol abuse Neg Hx   . Diabetes Neg Hx   . Stroke Neg Hx     History   Social History  .  Marital Status: Married    Spouse Name: N/A    Number of Children: 3  . Years of Education: N/A   Occupational History  . Biomedical engineer     Retired  . [employer]     retired also   Social History Main Topics  . Smoking status: Never Smoker   . Smokeless tobacco: Never Used  . Alcohol Use: 0.0 oz/week     occasonally drinks wine  . Drug Use: No  . Sexually Active: Not on file   Other Topics Concern  . Not on file   Social History Narrative   Has living willWife, then son Dorinda Hill, is his health care POAWould accept resuscitation attempts but no prolonged ventilationWould consider feeding tube   Review of Systems  Constitutional: Negative for fatigue and unexpected weight change.       Wears seat belt  HENT: Positive for congestion, rhinorrhea and tinnitus. Negative for hearing loss and dental problem.        Spring allergy symptoms--eyes and nose Uses the fexofenadine Regular with dentist  Eyes: Negative for visual disturbance.  No diplopia or unilateral vision loss  Respiratory: Negative for cough and shortness of breath.   Cardiovascular: Negative for chest pain, palpitations and leg swelling.  Gastrointestinal: Negative for nausea, vomiting, abdominal pain, constipation and blood in stool.       Heartburn only with tomato products---avoids them  Genitourinary: Negative for urgency, frequency and difficulty urinating.       No nocturia on doxazosin No sexual problems  Musculoskeletal: Positive for arthralgias. Negative for back pain and joint swelling.       Some hand pain  Skin: Negative for rash.       No suspicious lesions  Neurological: Positive for tremors. Negative for dizziness, syncope, weakness, light-headedness, numbness and headaches.       Uses the mysoline only in AM (full tab) and satisfied  Psychiatric/Behavioral: Positive for sleep disturbance. Negative for dysphoric mood. The patient is not nervous/anxious.        Snoring dog keeps  him up at times Takes zolpidem almost nightly--discussed limiting       Objective:   Physical Exam  Constitutional: He is oriented to person, place, and time. He appears well-developed and well-nourished. No distress.  HENT:  Head: Normocephalic and atraumatic.  Right Ear: External ear normal.  Left Ear: External ear normal.  Mouth/Throat: Oropharynx is clear and moist. No oropharyngeal exudate.  Eyes: Conjunctivae and EOM are normal. Pupils are equal, round, and reactive to light.  Neck: Normal range of motion. Neck supple. No thyromegaly present.  Cardiovascular: Normal rate, regular rhythm, normal heart sounds and intact distal pulses.  Exam reveals no gallop.   No murmur heard. Pulmonary/Chest: Effort normal and breath sounds normal. No respiratory distress. He has no wheezes. He has no rales.  Abdominal: Soft. There is no tenderness.  Musculoskeletal: He exhibits no edema and no tenderness.  Lymphadenopathy:    He has no cervical adenopathy.  Neurological: He is alert and oriented to person, place, and time.  Skin: No rash noted. No erythema.  Psychiatric: He has a normal mood and affect. His behavior is normal.          Assessment & Plan:

## 2012-04-10 NOTE — Assessment & Plan Note (Signed)
Satisfied with the Rx 

## 2012-04-10 NOTE — Assessment & Plan Note (Signed)
Voids well with the doxazosin

## 2012-04-10 NOTE — Assessment & Plan Note (Signed)
Doing well on the med No wean given problems with cold weather

## 2012-04-10 NOTE — Assessment & Plan Note (Signed)
BP Readings from Last 3 Encounters:  04/10/12 112/70  10/18/11 119/61  09/22/11 99/44   Good control

## 2012-04-11 ENCOUNTER — Encounter: Payer: Self-pay | Admitting: *Deleted

## 2012-04-13 ENCOUNTER — Other Ambulatory Visit: Payer: Medicare Other

## 2012-04-13 DIAGNOSIS — Z1211 Encounter for screening for malignant neoplasm of colon: Secondary | ICD-10-CM

## 2012-04-13 LAB — FECAL OCCULT BLOOD, IMMUNOCHEMICAL: Fecal Occult Bld: NEGATIVE

## 2012-04-16 ENCOUNTER — Encounter: Payer: Self-pay | Admitting: *Deleted

## 2012-06-04 ENCOUNTER — Other Ambulatory Visit: Payer: Self-pay

## 2012-06-04 MED ORDER — PAROXETINE HCL 30 MG PO TABS: 30.0000 mg | ORAL_TABLET | Freq: Every day | ORAL | Status: DC

## 2012-06-04 NOTE — Telephone Encounter (Signed)
Pt request refill on [facility]. [facility]. Pt will ck with [facility] about refill.

## 2012-06-04 NOTE — Telephone Encounter (Signed)
Pt spoke with [facility] and they did not get refills on 12/26/11 rx. I will send electronically refill # 90 x 2 [facility]. Pt notified while on phone.

## 2012-06-07 ENCOUNTER — Other Ambulatory Visit: Payer: Self-pay | Admitting: *Deleted

## 2012-06-07 MED ORDER — ZOLPIDEM TARTRATE 5 MG PO TABS: 5.0000 mg | ORAL_TABLET | Freq: Every evening | ORAL | Status: DC | PRN

## 2012-06-07 NOTE — Telephone Encounter (Signed)
Okay #30 x 0 

## 2012-06-07 NOTE — Telephone Encounter (Signed)
rx called into pharmacy

## 2012-06-13 ENCOUNTER — Ambulatory Visit: Payer: Medicare Other | Admitting: Internal Medicine

## 2012-07-09 ENCOUNTER — Other Ambulatory Visit: Payer: Self-pay | Admitting: *Deleted

## 2012-07-09 MED ORDER — ZOLPIDEM TARTRATE 5 MG PO TABS: 5.0000 mg | ORAL_TABLET | Freq: Every evening | ORAL | Status: DC | PRN

## 2012-07-09 NOTE — Telephone Encounter (Signed)
Okay #30 x 0 

## 2012-07-09 NOTE — Telephone Encounter (Signed)
rx called into pharmacy

## 2012-08-10 ENCOUNTER — Telehealth: Payer: Self-pay | Admitting: *Deleted

## 2012-08-10 NOTE — Telephone Encounter (Signed)
Patient left a message on your voicemail saying that he would like to get an appt to get a flu shot and a pneumonia shot at the same time.  He is requesting the appt for Wednesday at 10:00 because his wife is getting a flu shot at that time.  I didn't know how to schedule this or if he could get both the flu and pneumonia shots at that time.

## 2012-08-13 ENCOUNTER — Other Ambulatory Visit: Payer: Self-pay | Admitting: *Deleted

## 2012-08-13 MED ORDER — ZOLPIDEM TARTRATE 5 MG PO TABS: 5.0000 mg | ORAL_TABLET | Freq: Every evening | ORAL | Status: DC | PRN

## 2012-08-13 NOTE — Telephone Encounter (Signed)
Last refill 07/09/12

## 2012-08-13 NOTE — Telephone Encounter (Signed)
rx called into pharmacy

## 2012-08-13 NOTE — Telephone Encounter (Signed)
Patient doesn't need a pneumonia shot he had one in 2010. I will schedule him for a flu shot. Spoke with patient's wife and advised results

## 2012-08-13 NOTE — Telephone Encounter (Signed)
Okay #30 x 1 

## 2012-08-15 ENCOUNTER — Ambulatory Visit: Payer: Medicare Other

## 2012-08-30 ENCOUNTER — Telehealth: Payer: Self-pay

## 2012-08-30 DIAGNOSIS — F0390 Unspecified dementia without behavioral disturbance: Secondary | ICD-10-CM

## 2012-08-30 NOTE — Telephone Encounter (Signed)
Pt request referral to Dr Grandville Silos, [place] for Lewy Body dementia. Pt said no further progression with memory loss or tremors and no hallucinations but wants neurologist opinion. Please advise. Pt wants appt on Tues or Fri anytime or Mon, Wed, [place] in early AM.

## 2012-08-30 NOTE — Telephone Encounter (Signed)
Okay to go ahead with referral 

## 2012-08-31 NOTE — Telephone Encounter (Signed)
Appt Dr Kemper [facility] made and patient aware.

## 2012-09-03 ENCOUNTER — Other Ambulatory Visit: Payer: Self-pay | Admitting: *Deleted

## 2012-09-03 MED ORDER — PRIMIDONE 50 MG PO TABS: 25.0000 mg | ORAL_TABLET | Freq: Two times a day (BID) | ORAL | Status: DC | PRN

## 2012-09-03 MED ORDER — PRIMIDONE 50 MG PO TABS: 0.2500 mg | ORAL_TABLET | Freq: Two times a day (BID) | ORAL | Status: DC | PRN

## 2012-09-24 ENCOUNTER — Encounter: Payer: Self-pay | Admitting: Family Medicine

## 2012-09-24 ENCOUNTER — Ambulatory Visit (INDEPENDENT_AMBULATORY_CARE_PROVIDER_SITE_OTHER): Payer: Medicare Other | Admitting: Family Medicine

## 2012-09-24 VITALS — BP 136/78 | HR 76 | Temp 98.1°F | Wt 143.2 lb

## 2012-09-24 DIAGNOSIS — F0390 Unspecified dementia without behavioral disturbance: Secondary | ICD-10-CM

## 2012-09-24 DIAGNOSIS — J019 Acute sinusitis, unspecified: Secondary | ICD-10-CM | POA: Insufficient documentation

## 2012-09-24 MED ORDER — AMOXICILLIN-POT CLAVULANATE 875-125 MG PO TABS: 1.0000 | ORAL_TABLET | Freq: Two times a day (BID) | ORAL | Status: AC

## 2012-09-24 NOTE — Patient Instructions (Signed)
You have a sinus infection, likely viral as early on. Take medicine as prescribed: continue astepro, may use simple mucinex or immediate release guaifenesin if congestion returning. Antibiotic prescription for augmentin provided in case any worsening, or prolonged symptoms past 10 days, or fever >101. Push fluids and plenty of rest. Nasal saline irrigation or neti pot to help drain sinuses. May use simple mucinex with plenty of fluid to help mobilize mucous. Check up front about change in provider.

## 2012-09-24 NOTE — Progress Notes (Signed)
  Subjective:    Patient ID: Rodney Branch, male    DOB: 08/19/1931, 78 y.o.   MRN: [id number]  HPI CC: sinusitis?  4-5d h/o sinus congestion with bloody discharge as well as yellow/green sputum from left nostril.  + h/o PNdrip.  Mild RN.    Tried astepro nasal spray which he had residual from prior allergist evaluation.  Has also tried some other allergy medication from Dr. Corinda Gubler.  Denies fevers/chills, ear or tooth pain, headaches, sore throat, coughing.  No sick contacts at home. No smokers at home. No h/o asthma,COPD.  Prior saw Dr. Hetty Ely. Would like switch from prior PCP for personal reasons. lewy body dementia scare, ?related to propranolol.  Saw neurologist who didn't think he had dementia.  Past Medical History  Diagnosis Date  . Depression 1996    Psych Admit ([place]) 1996  . Hypertension 1996  . Benign prostatic hypertrophy 1996  . Tremor, essential   . Psychiatric hospitalization 12/1994    self referral/anxiety depression     Review of Systems Per HPI    Objective:   Physical Exam  Nursing note and vitals reviewed. Constitutional: He appears well-developed and well-nourished. No distress.  HENT:  Head: Normocephalic and atraumatic.  Right Ear: Hearing, tympanic membrane, external ear and ear canal normal.  Left Ear: Hearing, tympanic membrane, external ear and ear canal normal.  Nose: Mucosal edema present. No rhinorrhea. Right sinus exhibits no maxillary sinus tenderness and no frontal sinus tenderness. Left sinus exhibits no maxillary sinus tenderness and no frontal sinus tenderness.  Mouth/Throat: Uvula is midline and mucous membranes are normal. Posterior oropharyngeal erythema present. No oropharyngeal exudate, posterior oropharyngeal edema or tonsillar abscesses.  Eyes: Conjunctivae normal and EOM are normal. Pupils are equal, round, and reactive to light. No scleral icterus.  Neck: Normal range of motion. Neck supple.  Cardiovascular: Normal  rate, regular rhythm, normal heart sounds and intact distal pulses.   No murmur heard. Pulmonary/Chest: Effort normal and breath sounds normal. No respiratory distress. He has no wheezes. He has no rales.       No cough  Lymphadenopathy:    He has no cervical adenopathy.  Skin: Skin is warm and dry. No rash noted.       Assessment & Plan:

## 2012-09-24 NOTE — Assessment & Plan Note (Signed)
Anticipate viral as early on.  Discussed this. As actually seeming to improve, advised to continue to monitor sxs as well as supportive care as per instructions. Pt agrees with plan to monitor.  if sxs persist past 10 days or any worsening, advised he fill abx WASP.

## 2012-09-24 NOTE — Assessment & Plan Note (Signed)
Recent evaluation by Dr. Chestine Spore negative for dementia.  No records of eval in chart.

## 2012-10-11 ENCOUNTER — Ambulatory Visit: Payer: Medicare Other | Admitting: Internal Medicine

## 2012-12-04 ENCOUNTER — Other Ambulatory Visit: Payer: Self-pay

## 2012-12-04 MED ORDER — DOXAZOSIN MESYLATE 1 MG PO TABS: 1.0000 mg | ORAL_TABLET | Freq: Every day | ORAL | Status: DC

## 2012-12-04 MED ORDER — PAROXETINE HCL 40 MG PO TABS: 40.0000 mg | ORAL_TABLET | ORAL | Status: DC

## 2012-12-04 NOTE — Telephone Encounter (Signed)
Pt left note [facility] for paroxetine and doxazosion. In 08/2012 Pt saw psychiatrist DrCoble who increased paroxetine from 30 mg to 40 mg. Pt wants Dr Sharen Hones to prescribe the Paroxetine 40 mg.Please advise.

## 2012-12-04 NOTE — Telephone Encounter (Signed)
plz notify sent in. 

## 2012-12-05 NOTE — Telephone Encounter (Signed)
Patient notified

## 2013-01-17 ENCOUNTER — Other Ambulatory Visit: Payer: Self-pay | Admitting: Family Medicine

## 2013-01-17 NOTE — Telephone Encounter (Signed)
OK to refill

## 2013-01-17 NOTE — Telephone Encounter (Signed)
plz phone in. 

## 2013-01-18 NOTE — Telephone Encounter (Signed)
Rx called in as directed.   

## 2013-04-07 ENCOUNTER — Other Ambulatory Visit: Payer: Self-pay | Admitting: Family Medicine

## 2013-04-07 DIAGNOSIS — I10 Essential (primary) hypertension: Secondary | ICD-10-CM

## 2013-04-07 DIAGNOSIS — E7800 Pure hypercholesterolemia, unspecified: Secondary | ICD-10-CM

## 2013-04-10 ENCOUNTER — Other Ambulatory Visit (INDEPENDENT_AMBULATORY_CARE_PROVIDER_SITE_OTHER): Payer: Medicare Other

## 2013-04-10 DIAGNOSIS — I10 Essential (primary) hypertension: Secondary | ICD-10-CM

## 2013-04-10 DIAGNOSIS — E7800 Pure hypercholesterolemia, unspecified: Secondary | ICD-10-CM

## 2013-04-10 LAB — LIPID PANEL
Total CHOL/HDL Ratio: 4
VLDL: 20.8 mg/dL (ref 0.0–40.0)

## 2013-04-10 LAB — BASIC METABOLIC PANEL
CO2: 26 mEq/L (ref 19–32)
Chloride: 107 mEq/L (ref 96–112)
Creatinine, Ser: 1.1 mg/dL (ref 0.4–1.5)
Potassium: 3.9 mEq/L (ref 3.5–5.1)
Sodium: 140 mEq/L (ref 135–145)

## 2013-04-10 LAB — LDL CHOLESTEROL, DIRECT: Direct LDL: 148.8 mg/dL

## 2013-04-10 LAB — TSH: TSH: 1.48 u[IU]/mL (ref 0.35–5.50)

## 2013-04-11 ENCOUNTER — Other Ambulatory Visit: Payer: Medicare Other

## 2013-04-15 ENCOUNTER — Encounter: Payer: Self-pay | Admitting: Radiology

## 2013-04-16 ENCOUNTER — Encounter: Payer: Self-pay | Admitting: Family Medicine

## 2013-04-16 ENCOUNTER — Ambulatory Visit (INDEPENDENT_AMBULATORY_CARE_PROVIDER_SITE_OTHER): Payer: Medicare Other | Admitting: Family Medicine

## 2013-04-16 ENCOUNTER — Encounter: Payer: Medicare Other | Admitting: Internal Medicine

## 2013-04-16 VITALS — BP 130/80 | HR 80 | Temp 98.4°F | Ht 67.25 in | Wt 145.5 lb

## 2013-04-16 DIAGNOSIS — G4700 Insomnia, unspecified: Secondary | ICD-10-CM

## 2013-04-16 DIAGNOSIS — G250 Essential tremor: Secondary | ICD-10-CM

## 2013-04-16 DIAGNOSIS — Z0000 Encounter for general adult medical examination without abnormal findings: Secondary | ICD-10-CM

## 2013-04-16 DIAGNOSIS — I10 Essential (primary) hypertension: Secondary | ICD-10-CM

## 2013-04-16 DIAGNOSIS — F329 Major depressive disorder, single episode, unspecified: Secondary | ICD-10-CM

## 2013-04-16 DIAGNOSIS — F0390 Unspecified dementia without behavioral disturbance: Secondary | ICD-10-CM

## 2013-04-16 DIAGNOSIS — G252 Other specified forms of tremor: Secondary | ICD-10-CM

## 2013-04-16 DIAGNOSIS — N400 Enlarged prostate without lower urinary tract symptoms: Secondary | ICD-10-CM

## 2013-04-16 DIAGNOSIS — E7800 Pure hypercholesterolemia, unspecified: Secondary | ICD-10-CM

## 2013-04-16 MED ORDER — ZOLPIDEM TARTRATE 10 MG PO TABS: 5.0000 mg | ORAL_TABLET | Freq: Every evening | ORAL | Status: AC | PRN

## 2013-04-16 NOTE — Assessment & Plan Note (Signed)
Reviewed #s with patient - discussed low chol diet - and handout provided.

## 2013-04-16 NOTE — Assessment & Plan Note (Signed)
Stable off meds. ?

## 2013-04-16 NOTE — Assessment & Plan Note (Signed)
Refilled ambien 10mg  which patient cuts in half.  Minimizes use.

## 2013-04-16 NOTE — Patient Instructions (Addendum)
Good to see you today, call us with questions. Return in 1 year for next medicare wellness visit or as needed, prior fasting for blood work. Work on low cholesterol diet to lower levels.neurologist

## 2013-04-16 NOTE — Progress Notes (Signed)
Subjective:    Patient ID: Rodney Branch, male    DOB: 1931-06-08, 79 y.o.   MRN: [id number]  HPI CC: medicare wellness visit  Essential tremor - on primidone twice daily, well controlled.  Hearing and vision screens passed today. Denies falls in last year. Depression - on paxil 40mg  daily (recently increased by Dr. Jennelle Human psych in [place]) - doing well with this.  Longstanding h/o depression since age 57yo.  1 psychiatric hospitalization.  Activity: bowling 3x/wk Diet: good water, fruits/vegetables daily. Cutting down on beef. Good fish in diet.  Preventative: States last colonoscopy was about 5 yrs ago - normal per pt.  Regular stools currently. Prostate - h/o BPH.  On doxazosin.  Vit D helps as well. Lab Results  Component Value Date   PSA 1.83 03/30/2010   PSA 2.17 01/01/2009   PSA 2.17 12/26/2007  zostavax - 2008 Td 2008 pneumovax 2010 Flu - yearly. Advanced directives: Has living will.  son is co executor with attorney.  Wife would be HCPOA then son. Would accept resuscitation attempts but no prolonged ventilation Would consider feeding tube  Medications and allergies reviewed and updated in chart.  Past histories reviewed and updated if relevant as below. Patient Active Problem List   Diagnosis Date Noted  . Sinusitis, acute 09/24/2012  . Dementia 09/22/2011  . Routine general medical examination at a health care facility 04/04/2011  . DUPUYTREN'S CONTRACTURE 07/12/2010  . TINNITUS 06/08/2010  . NEOPLASM, SKIN, UNCERTAIN BEHAVIOR 05/03/2010  . INSOMNIA, CHRONIC 05/20/2009  . GERD 05/20/2009  . ALLERGY 12/29/2008  . TREMOR, ESSENTIAL 01/01/2008  . DEPRESSION 12/24/2007  . HYPERTENSION 12/24/2007  . BENIGN PROSTATIC HYPERTROPHY 12/24/2007  . HYPERCHOLESTEROLEMIA 12/21/2007  . SEASONAL AFFECTIVE DISORDER 12/11/2007  . ANXIETY 12/11/2007   Past Medical History  Diagnosis Date  . Depression 1996    Psych Admit ([place]) 1996  . Hypertension 1996  . Benign prostatic  hypertrophy 1996  . Tremor, essential   . Psychiatric hospitalization 12/1994    self referral/anxiety depression   Past Surgical History  Procedure Laterality Date  . Cataract extraction  2003    Dr. Nelle Don  . Dupuytren contracture release  3/13    Dr Butler Denmark   History  Substance Use Topics  . Smoking status: Never Smoker   . Smokeless tobacco: Never Used  . Alcohol Use: 0.0 oz/week     Comment: occasonally drinks wine   Family History  Problem Relation Age of Onset  . Depression Mother 87    alzheimers//long history  . Cancer Father 67    cancer of stomach  . Alzheimer's disease    . Alcohol abuse Neg Hx   . Diabetes Neg Hx   . Stroke Neg Hx    Allergies  Allergen Reactions  . Propranolol     Hallucinations (resolved upon cessation)   Current Outpatient Prescriptions on File Prior to Visit  Medication Sig Dispense Refill  . aspirin 81 MG tablet Take 81 mg by mouth daily.        . cyanocobalamin 2000 MCG tablet Take 2,000 mcg by mouth daily.      [name] doxazosin (CARDURA) 1 MG tablet Take 1 tablet (1 mg total) by mouth daily.  90 tablet  3  . PARoxetine (PAXIL) 40 MG tablet Take 1 tablet (40 mg total) by mouth every morning.  90 tablet  3  . primidone (MYSOLINE) 50 MG tablet Take 50 mg by mouth 2 (two) times daily.      [name]  fexofenadine (ALLEGRA) 180 MG tablet Take 180 mg by mouth daily.         No current facility-administered medications on file prior to visit.     Review of Systems  Constitutional: Negative for fever, chills, activity change, appetite change, fatigue and unexpected weight change.  HENT: Negative for hearing loss and neck pain.   Eyes: Negative for visual disturbance.  Respiratory: Negative for cough, chest tightness, shortness of breath and wheezing.   Cardiovascular: Negative for chest pain, palpitations and leg swelling.  Gastrointestinal: Negative for nausea, vomiting, abdominal pain, diarrhea, constipation, blood in stool and abdominal  distention.  Genitourinary: Negative for hematuria and difficulty urinating.  Musculoskeletal: Negative for myalgias and arthralgias.  Skin: Negative for rash.  Neurological: Negative for dizziness, seizures, syncope and headaches.  Hematological: Negative for adenopathy. Does not bruise/bleed easily.  Psychiatric/Behavioral: Negative for dysphoric mood. The patient is not nervous/anxious.        Objective:   Physical Exam  Nursing note and vitals reviewed. Constitutional: He is oriented to person, place, and time. He appears well-developed and well-nourished. No distress.  HENT:  Head: Normocephalic and atraumatic.  Right Ear: External ear normal.  Left Ear: External ear normal.  Nose: Nose normal.  Mouth/Throat: Oropharynx is clear and moist. No oropharyngeal exudate.  Eyes: Conjunctivae and EOM are normal. Pupils are equal, round, and reactive to light. No scleral icterus.  Neck: Normal range of motion. Neck supple. Carotid bruit is not present. No thyromegaly present.  Cardiovascular: Normal rate, regular rhythm, normal heart sounds and intact distal pulses.   No murmur heard. Pulses:      Radial pulses are 2+ on the right side, and 2+ on the left side.  Pulmonary/Chest: Effort normal and breath sounds normal. No respiratory distress. He has no wheezes. He has no rales.  Abdominal: Soft. Bowel sounds are normal. He exhibits no distension and no mass. There is no tenderness. There is no rebound and no guarding.  Genitourinary: Rectum normal. Rectal exam shows no external hemorrhoid, no internal hemorrhoid, no fissure, no mass, no tenderness and anal tone normal. Prostate is enlarged (~30-35gm). Prostate is not tender.  Musculoskeletal: Normal range of motion. He exhibits no edema.  Lymphadenopathy:    He has no cervical adenopathy.  Neurological: He is alert and oriented to person, place, and time.  CN grossly intact, station and gait intact  Skin: Skin is warm and dry. No rash  noted.  Psychiatric: He has a normal mood and affect. His behavior is normal. Judgment and thought content normal.       Assessment & Plan:

## 2013-04-16 NOTE — Assessment & Plan Note (Signed)
Stable on primadone.

## 2013-04-16 NOTE — Assessment & Plan Note (Signed)
Chronic, stable on paxil.

## 2013-04-16 NOTE — Assessment & Plan Note (Addendum)
Has seen neurologist (Dr. Chestine Spore) with negative eval for dementia.  Will remove from list.

## 2013-04-16 NOTE — Assessment & Plan Note (Signed)
I have personally reviewed the Medicare Annual Wellness questionnaire and have noted 1. The patient's medical and social history 2. Their use of alcohol, tobacco or illicit drugs 3. Their current medications and supplements 4. The patient's functional ability including ADL's, fall risks, home safety risks and hearing or visual impairment. 5. Diet and physical activity 6. Evidence for depression or mood disorders The patients weight, height, BMI have been recorded in the chart.  Hearing and vision has been addressed. I have made referrals, counseling and provided education to the patient based review of the above and I have provided the pt with a written personalized care plan for preventive services. See scanned questionairre. Advanced directives discussed: has at home.  Wife is HCPOA.  Reviewed preventative protocols and updated unless pt declined. UTD immunizations. No PSA given age.  H/o BPH.

## 2013-04-16 NOTE — Assessment & Plan Note (Signed)
Chronic, stable. Continue doxazosin.

## 2013-07-24 ENCOUNTER — Other Ambulatory Visit: Payer: Self-pay | Admitting: Family Medicine

## 2013-07-24 MED ORDER — ZOLPIDEM TARTRATE 5 MG PO TABS: 5.0000 mg | ORAL_TABLET | Freq: Every evening | ORAL | Status: DC | PRN

## 2013-07-24 NOTE — Telephone Encounter (Signed)
plz phone in. 

## 2013-07-25 ENCOUNTER — Other Ambulatory Visit: Payer: Self-pay | Admitting: Family Medicine

## 2013-07-25 NOTE — Telephone Encounter (Signed)
Rx called in as directed.   

## 2013-09-02 ENCOUNTER — Other Ambulatory Visit: Payer: Self-pay | Admitting: Family Medicine

## 2013-09-02 NOTE — Telephone Encounter (Signed)
Apparently there has been some issues with refills. Patient left message that he has been trying to get RF on Primidone since last Thursday by [facility]. [facility] which confirmed they sent RF request numerous times with no response from [place]. I have not received electronic request at all, nor received error message that refills did not go through. I'm not sure what the issue was, but wanted you to be aware that the patient was unhappy and threatened to leave the practice if this continued.

## 2013-09-02 NOTE — Telephone Encounter (Signed)
Pt came to office to ck on status of refills. Spoke with [place] at [facility] to call in Zolpidem as instructed by Dr Farley Ly said [facility] to Dr Suzan Slick. Pt advised today is first day we have received request for zolpidem and primidone. Pt asked about the alprazolam refill; advised we do not have alprazolam on pts med list. Pt said had gotten previously from Dr Marc Morgans, psychiatrist but pt wants to get all his med thru Dr Reece Agar.  [facility] said she will [facility] request to Dr Reece Agar. Pt said he only takes alprazolam when bowls once a week; pt said if refilled in next 2 - 3 days that would be OK.

## 2013-09-02 NOTE — Telephone Encounter (Signed)
plz phone in. 

## 2013-09-02 NOTE — Telephone Encounter (Signed)
Dr Reece Agar please see other 09/02/13 note re; alprazolam refill.

## 2013-09-02 NOTE — Telephone Encounter (Signed)
Ok to refill 

## 2013-09-02 NOTE — Telephone Encounter (Signed)
Rx called in as directed.   

## 2013-09-02 NOTE — Telephone Encounter (Signed)
Sent in.  just received request today.

## 2013-09-02 NOTE — Telephone Encounter (Signed)
Pt left v/m that pt wants primidone sent to [facility] today. Please advise. [facility] [place] sent electronic refill request for zolpidem and primidone.

## 2013-09-30 ENCOUNTER — Other Ambulatory Visit: Payer: Self-pay | Admitting: Family Medicine

## 2013-10-01 NOTE — Telephone Encounter (Signed)
Rx called in as directed.   

## 2013-10-01 NOTE — Telephone Encounter (Signed)
plz phone in. 

## 2013-10-03 ENCOUNTER — Telehealth: Payer: Self-pay

## 2013-10-03 ENCOUNTER — Telehealth: Payer: Self-pay | Admitting: *Deleted

## 2013-10-03 NOTE — Telephone Encounter (Signed)
PA for Ambien in your IN box for completion. 

## 2013-10-03 NOTE — Telephone Encounter (Signed)
Pt left v/m requesting our office call [phone] for quantity exception forms for zolpidem. Spoke with pt and he said [facility] will fax or mail form to Dr Reece Agar for quantity exception form for zolpidem. If our office does not receive form then pt wants [place] to call and get form for zolpidem. Pt request cb when zolpidem is approved.

## 2013-10-04 NOTE — Telephone Encounter (Signed)
[facility] called to get diagnosis code 307.42 for zolpidem and confirm instructions. Marchelle Folks said approved and pt can have pharmacy process at any time. Spoke with Sheralyn Boatman at [facility] [employer] and pt picked up zolpidem 10/03/13. Left v/m for pt.

## 2013-10-07 NOTE — Telephone Encounter (Signed)
Filled and placed in my out box. Pt may need to try trazodone prior to continuing ambien.

## 2013-11-01 ENCOUNTER — Encounter: Payer: Self-pay | Admitting: *Deleted

## 2013-11-02 ENCOUNTER — Other Ambulatory Visit: Payer: Self-pay | Admitting: Family Medicine

## 2013-11-02 NOTE — Telephone Encounter (Signed)
plz phone in. 

## 2013-11-04 ENCOUNTER — Telehealth: Payer: Self-pay | Admitting: Family Medicine

## 2013-11-04 NOTE — Telephone Encounter (Signed)
Pt needs a rx for Flonase.  He said we called him but he wasn't home.  I do not see any note stating what questions we had for him.

## 2013-11-04 NOTE — Telephone Encounter (Signed)
Rx called in as directed.   

## 2013-11-04 NOTE — Telephone Encounter (Signed)
Pt said he has used Flonase plenty of times before. He wants this prescribed asap.  He said Nasonex is too expensive so he wants the Flonase. Please call pt.

## 2013-11-04 NOTE — Telephone Encounter (Signed)
Attempted to call patient numerous times-line busy. I did not call patient earlier today. We have not prescribed flonase or nasonex and have no record of him taking either of these meds. Will have to speak with patient. Will try again later.

## 2013-11-05 MED ORDER — FLUTICASONE PROPIONATE 50 MCG/ACT NA SUSP: 2.0000 | Freq: Every day | NASAL | Status: DC

## 2013-11-05 NOTE — Telephone Encounter (Signed)
plz notify flonase sent in.

## 2013-11-05 NOTE — Telephone Encounter (Signed)
Patient walked in today and said that Dr. Stevphen Rochester gave him a sample of Nasonex for "conjuctivitis in his nose". He went to get it filled and it was going to cost him $94. Pharmacy said flonase was only $8-9 but he would need a new prescription for that. [facility].

## 2013-11-05 NOTE — Telephone Encounter (Signed)
Patient notified

## 2013-11-28 DIAGNOSIS — C439 Malignant melanoma of skin, unspecified: Secondary | ICD-10-CM

## 2013-11-28 HISTORY — DX: Malignant melanoma of skin, unspecified: C43.9

## 2013-12-04 ENCOUNTER — Telehealth: Payer: Self-pay | Admitting: Family Medicine

## 2013-12-04 MED ORDER — DOXAZOSIN MESYLATE 1 MG PO TABS: 1.0000 mg | ORAL_TABLET | Freq: Every day | ORAL | Status: DC

## 2013-12-04 NOTE — Telephone Encounter (Signed)
[facility]

## 2013-12-04 NOTE — Telephone Encounter (Signed)
Pt is needing refill on Doxazosin Mes Tablets 1 mg. Pt is wanting to switch over to [facility] [place].

## 2013-12-05 ENCOUNTER — Telehealth: Payer: Self-pay | Admitting: Family Medicine

## 2013-12-05 MED ORDER — ZOLPIDEM TARTRATE 10 MG PO TABS: ORAL_TABLET | ORAL | Status: DC

## 2013-12-05 NOTE — Telephone Encounter (Signed)
Ok to refill and change mg's?

## 2013-12-05 NOTE — Telephone Encounter (Signed)
Pt is needing refill on Zolpidem Tartrate 10 mg. The last refill was only for 5mg 's and pt says he usually cuts the 10mg 's in half and would like to go back to the 10mg 's. [facility] [place]. Told pt to contact pharmacy but they told him to contact us to get the mg's changed.

## 2013-12-05 NOTE — Telephone Encounter (Signed)
plz phone in and notify patient plz send me prior auth to fill out for zolpidem 10mg  nightly.  Insurance may not cover 10mg  while it did cover 5mg .

## 2013-12-06 NOTE — Telephone Encounter (Signed)
Rx called in as directed and PA form in your IN box for completion. Message left advising patient.

## 2013-12-08 NOTE — Telephone Encounter (Signed)
Filled and placed in [name]'s box. 

## 2013-12-09 NOTE — Telephone Encounter (Signed)
Faxed as directed. 

## 2013-12-10 NOTE — Telephone Encounter (Signed)
Per pharmacy medication was filled for #30, did not need prior auth. Prior auth paperwork sent to be scanned in chart.

## 2014-01-06 ENCOUNTER — Telehealth: Payer: Self-pay | Admitting: Family Medicine

## 2014-01-06 MED ORDER — PAROXETINE HCL 40 MG PO TABS: 40.0000 mg | ORAL_TABLET | ORAL | Status: DC

## 2014-01-06 NOTE — Telephone Encounter (Signed)
Pt would like to get a refill on Peroxitine tablets 40 mg. And would like [facility] [place].

## 2014-01-06 NOTE — Telephone Encounter (Signed)
Rx sent in as requested. 

## 2014-01-08 ENCOUNTER — Other Ambulatory Visit: Payer: Self-pay

## 2014-01-08 MED ORDER — PRIMIDONE 50 MG PO TABS: ORAL_TABLET | ORAL | Status: DC

## 2014-01-08 NOTE — Telephone Encounter (Signed)
Sent in

## 2014-01-08 NOTE — Telephone Encounter (Signed)
Pt saw Dr Loletta Specter neurologist for a one time appt (pt has no f/u appt with Dr Loletta Specter) and Dr Loletta Specter sent in refill for Primidone to [facility] [place] and [facility] said their computer system cancelled Dr Synthia Innocent previous refill for Primidone in 08/2013. Pt request refill Primidone to [facility] [place].Please advise.

## 2014-01-15 ENCOUNTER — Telehealth: Payer: Self-pay | Admitting: Family Medicine

## 2014-01-15 NOTE — Telephone Encounter (Signed)
received request from patient to fill PA for ambien 10mg .  Printed PA done last month as well as phone note from last month where it seemed prior auth was not needed. plz call pharmacy to verify - does this need prior auth or not, then notify patient.   Printed all info of interest and placed in [name]'s box.

## 2014-01-16 NOTE — Telephone Encounter (Signed)
It appears as though the initial PA was done based off of the [facility] Medicare coverage he had last year. He has not been seen this year to get a copy of new card/coverage. Called [employer]-PA done over the phone. Approved for 1 year and backdated through 11/28/13. Pharmacy notified.

## 2014-01-27 ENCOUNTER — Encounter: Payer: Self-pay | Admitting: Internal Medicine

## 2014-01-27 ENCOUNTER — Ambulatory Visit (INDEPENDENT_AMBULATORY_CARE_PROVIDER_SITE_OTHER): Payer: Medicare HMO | Admitting: Internal Medicine

## 2014-01-27 VITALS — BP 120/74 | HR 90 | Temp 97.4°F | Wt 144.2 lb

## 2014-01-27 DIAGNOSIS — B9789 Other viral agents as the cause of diseases classified elsewhere: Principal | ICD-10-CM

## 2014-01-27 DIAGNOSIS — J069 Acute upper respiratory infection, unspecified: Secondary | ICD-10-CM

## 2014-01-27 NOTE — Progress Notes (Signed)
HPI  Pt presents to the clinic today with c/o cough and sore throat. He reports this started about 2 days ago. The cough is productive of thick white mucous. He also has noticed popping in his ears. He did have a fever of 100.0 on Saturday. He does have a history of allergies but denies breathing problems. He take flonase daily. He does not use his allegra- he does not think it is effective. He has not had sick contacts.  Review of Systems      Past Medical History  Diagnosis Date  . Depression 1996    Psych Admit ([place]) 1996  . Hypertension 1996  . Benign prostatic hypertrophy 1996  . Tremor, essential   . Psychiatric hospitalization 12/1994    self referral/anxiety depression    Family History  Problem Relation Age of Onset  . Depression Mother 74    alzheimers//long history  . Cancer Father 4    cancer of stomach  . Alzheimer's disease    . Alcohol abuse Neg Hx   . Diabetes Neg Hx   . Stroke Neg Hx   . CAD Neg Hx     History   Social History  . Marital Status: Married    Spouse Name: N/A    Number of Children: 3  . Years of Education: N/A   Occupational History  . Research scientist (life sciences)     Retired  . [employer]     retired also   Social History Main Topics  . Smoking status: Never Smoker   . Smokeless tobacco: Never Used  . Alcohol Use: 0.0 oz/week     Comment: occasonally drinks wine  . Drug Use: No  . Sexual Activity: Not on file   Other Topics Concern  . Not on file   Social History Narrative   Has living will   Wife, then son [name], is his health care POA   Would accept resuscitation attempts but no prolonged ventilation   Would consider feeding tube   Activity: bowling 3x/wk   Diet: good water, fruits/vegetables daily    Allergies  Allergen Reactions  . Propranolol     Hallucinations (resolved upon cessation)     Constitutional: Positive fever. Denies headache, fatigue or abrupt weight changes.  HEENT:  Positive runny  nose, sore throat. Denies eye redness, eye pain, pressure behind the eyes, facial pain, nasal congestion, ear pain, ringing in the ears, wax buildup, or bloody nose. Respiratory: Positive cough. Denies difficulty breathing or shortness of breath.  Cardiovascular: Denies chest pain, chest tightness, palpitations or swelling in the hands or feet.   No other specific complaints in a complete review of systems (except as listed in HPI above).  Objective:   BP 120/74  Pulse 90  Temp(Src) 97.4 F (36.3 C) (Oral)  Wt 144 lb 4 oz (65.431 kg)  SpO2 98% Wt Readings from Last 3 Encounters:  01/27/14 144 lb 4 oz (65.431 kg)  04/16/13 145 lb 8 oz (65.998 kg)  09/24/12 143 lb 4 oz (64.978 kg)     General: Appears his stated age, well developed, well nourished in NAD. HEENT: Head: normal shape and size; Eyes: sclera white, no icterus, conjunctiva pink, PERRLA and EOMs intact; Ears: Tm's gray and intact, normal light reflex; Nose: mucosa pink and moist, septum midline; Throat/Mouth: + PND. Teeth present, mucosa erythematous and moist, no exudate noted, no lesions or ulcerations noted.  Neck:  Neck supple, trachea midline. No massses, lumps or thyromegaly present.  Cardiovascular: Normal rate and rhythm. S1,S2 noted.  No murmur, rubs or gallops noted. No JVD or BLE edema. No carotid bruits noted. Pulmonary/Chest: Normal effort and positive vesicular breath sounds. No respiratory distress. No wheezes, rales or ronchi noted.      Assessment & Plan:   Upper Respiratory Infection, likely viral at this time:  Get some rest and drink plenty of water Do salt water gargles for the sore throat Continue allegra and flonase OTC Delsym prn for cough  RTC as needed or if symptoms persist.

## 2014-01-27 NOTE — Progress Notes (Signed)
Pre visit review using our clinic review tool, if applicable. No additional management support is needed unless otherwise documented below in the visit note. 

## 2014-01-27 NOTE — Patient Instructions (Addendum)

## 2014-01-28 ENCOUNTER — Other Ambulatory Visit: Payer: Self-pay | Admitting: Family Medicine

## 2014-01-28 NOTE — Telephone Encounter (Signed)
Ok to refill 

## 2014-01-29 NOTE — Telephone Encounter (Signed)
plz phone in. 

## 2014-01-29 NOTE — Telephone Encounter (Signed)
Rx called in as directed.   

## 2014-01-30 ENCOUNTER — Telehealth: Payer: Self-pay | Admitting: Family Medicine

## 2014-01-30 NOTE — Telephone Encounter (Signed)
Pt changed insurance plans and all of his Rx went from a tier 1 to a tier 2 cost. Pt called insurance and they advise him that Oxcarbazepine is a tier 1 and it's cheaper that the primidone. Pt wanted to know if you thought that the Rxs are similar enough for him to change. If the Rx change is okay with you then pt is requesting a paper Rx to pick-up tomorrow when he brings his wife in for an appt with you

## 2014-01-31 MED ORDER — GABAPENTIN 100 MG PO CAPS: 100.0000 mg | ORAL_CAPSULE | Freq: Three times a day (TID) | ORAL | Status: DC

## 2014-01-31 NOTE — Telephone Encounter (Signed)
Patient notified and will pick up Rx at Mrs. Parker's appt today.

## 2014-01-31 NOTE — Telephone Encounter (Signed)
This was being used for tremor.  Oxcarbazepine is a seizure medicine  Would suggest trial of gabapentin instead - and have printed out script for him to take with him at wife's office visit today - placed in [name]'s box.

## 2014-04-17 ENCOUNTER — Telehealth: Payer: Self-pay | Admitting: Family Medicine

## 2014-04-17 ENCOUNTER — Ambulatory Visit (INDEPENDENT_AMBULATORY_CARE_PROVIDER_SITE_OTHER): Payer: Medicare HMO | Admitting: Family Medicine

## 2014-04-17 ENCOUNTER — Encounter: Payer: Self-pay | Admitting: Family Medicine

## 2014-04-17 VITALS — BP 128/84 | HR 80 | Temp 98.2°F | Ht 66.0 in | Wt 145.2 lb

## 2014-04-17 DIAGNOSIS — F329 Major depressive disorder, single episode, unspecified: Secondary | ICD-10-CM

## 2014-04-17 DIAGNOSIS — G250 Essential tremor: Secondary | ICD-10-CM

## 2014-04-17 DIAGNOSIS — F39 Unspecified mood [affective] disorder: Secondary | ICD-10-CM

## 2014-04-17 DIAGNOSIS — Z0000 Encounter for general adult medical examination without abnormal findings: Secondary | ICD-10-CM

## 2014-04-17 DIAGNOSIS — F3289 Other specified depressive episodes: Secondary | ICD-10-CM

## 2014-04-17 DIAGNOSIS — G4700 Insomnia, unspecified: Secondary | ICD-10-CM

## 2014-04-17 DIAGNOSIS — N400 Enlarged prostate without lower urinary tract symptoms: Secondary | ICD-10-CM

## 2014-04-17 DIAGNOSIS — I10 Essential (primary) hypertension: Secondary | ICD-10-CM

## 2014-04-17 DIAGNOSIS — E7800 Pure hypercholesterolemia, unspecified: Secondary | ICD-10-CM

## 2014-04-17 DIAGNOSIS — G252 Other specified forms of tremor: Secondary | ICD-10-CM

## 2014-04-17 DIAGNOSIS — Z23 Encounter for immunization: Secondary | ICD-10-CM

## 2014-04-17 NOTE — Assessment & Plan Note (Signed)
Continue cardura - stable.

## 2014-04-17 NOTE — Patient Instructions (Signed)
prevnar today. Return at your convenience fasting for blood work and we will call you with results. Return in 1 year for next wellness exam or as needed. Good to see you today, call us with quesitons.

## 2014-04-17 NOTE — Assessment & Plan Note (Signed)
Chronic, stable. Continue ambien which pt takes 1/2 to 1/3 pill nightly with good effect.

## 2014-04-17 NOTE — Assessment & Plan Note (Signed)
I have personally reviewed the Medicare Annual Wellness questionnaire and have noted 1. The patient's medical and social history 2. Their use of alcohol, tobacco or illicit drugs 3. Their current medications and supplements 4. The patient's functional ability including ADL's, fall risks, home safety risks and hearing or visual impairment. 5. Diet and physical activity 6. Evidence for depression or mood disorders The patients weight, height, BMI have been recorded in the chart.  Hearing and vision has been addressed. I have made referrals, counseling and provided education to the patient based review of the above and I have provided the pt with a written personalized care plan for preventive services. See scanned questionairre. Advanced directives discussed: reviewed and ensured UTD.  Reviewed preventative protocols and updated unless pt declined Declines prostate, colon cancer screening. prevnar today.

## 2014-04-17 NOTE — Assessment & Plan Note (Signed)
Stable on primadone. Continue med.

## 2014-04-17 NOTE — Progress Notes (Signed)
BP 128/84  Pulse 80  Temp(Src) 98.2 F (36.8 C) (Oral)  Ht $R'5\' 6"'Wt$  (1.676 m)  Wt 145 lb 4 oz (65.885 kg)  BMI 23.46 kg/m2   CC: medicare wellness  Subjective:    Patient ID: Rodney Branch, male    DOB: 11-13-1931, 80 y.o.   MRN: [id number]  HPI: STPEHEN PETITJEAN is a 80 y.o. male presenting on 04/17/2014 for Annual Exam   Insomnia, chronic - takes 1/2 to 1/3 ambien $RemoveBe'10mg'ElkvdJJQz$ .  Helping him sleep well.  Vision screen passed today. Failed hearing. Declines audiology referral. Denies falls in last year. Depression - on paxil $Remove'40mg'nnwkryI$  daily (increased by Dr. Clovis Pu psych in [place] 1-2 yrs ago) - doing well with this. Longstanding h/o recurrent MDD since age 49yo. 1 self referred psychiatric hospitalization.  Preventative: States last colonoscopy was about 6 yrs ago at [facility] (no records available) - normal per pt. Declines stool kit today. Known ext hemorrhoids. Prostate - h/o BPH and prostatitis. On doxazosin and doing well. Decided to defer prostate check unless issue develops. Flu - yearly Td 2008 pneumovax 2010, prevnar - today. zostavax - 2008 Advanced directives: Has living will. son is co executor with attorney. Wife would be HCPOA then son [name]).  Would accept resuscitation attempts but no prolonged ventilation. Would consider feeding tube  Occupation: retired, was Research scientist (life sciences) Activity: bowling 3x/wk  Diet: good water, fruits/vegetables daily. Cutting down on beef. Good fish in diet.   Relevant past medical, surgical, family and social history reviewed and updated as indicated.  Allergies and medications reviewed and updated. Current Outpatient Prescriptions on File Prior to Visit  Medication Sig  . ALPRAZolam (XANAX) 0.25 MG tablet TAKE 1 TABLET BY MOUTH TWICE A DAY AS NEEDED FOR ANXIETY  . aspirin 81 MG tablet Take 81 mg by mouth daily.    . cyanocobalamin 2000 MCG tablet Take 2,000 mcg by mouth daily.  [name] doxazosin (CARDURA) 1 MG tablet Take 1 tablet (1 mg  total) by mouth daily.  . fexofenadine (ALLEGRA) 180 MG tablet Take 180 mg by mouth daily as needed.   [name] PARoxetine (PAXIL) 40 MG tablet Take 1 tablet (40 mg total) by mouth every morning.  . primidone (MYSOLINE) 50 MG tablet TAKE 1 TABLET BY MOUTH TWICE A DAY  . cholecalciferol (VITAMIN D) 1000 UNITS tablet Take 2,000 Units by mouth daily.   No current facility-administered medications on file prior to visit.    Review of Systems Per HPI unless specifically indicated above    Objective:    BP 128/84  Pulse 80  Temp(Src) 98.2 F (36.8 C) (Oral)  Ht $R'5\' 6"'Fd$  (1.676 m)  Wt 145 lb 4 oz (65.885 kg)  BMI 23.46 kg/m2  Physical Exam  Nursing note and vitals reviewed. Constitutional: He is oriented to person, place, and time. He appears well-developed and well-nourished. No distress.  HENT:  Head: Normocephalic and atraumatic.  Right Ear: Hearing, tympanic membrane, external ear and ear canal normal.  Left Ear: Hearing, tympanic membrane, external ear and ear canal normal.  Nose: Nose normal.  Mouth/Throat: Uvula is midline, oropharynx is clear and moist and mucous membranes are normal. No oropharyngeal exudate, posterior oropharyngeal edema or posterior oropharyngeal erythema.  Eyes: Conjunctivae and EOM are normal. Pupils are equal, round, and reactive to light. No scleral icterus.  Neck: Normal range of motion. Neck supple. Carotid bruit is not present. No thyromegaly present.  Cardiovascular: Normal rate, regular rhythm, normal heart sounds and intact distal  pulses.   No murmur heard. Pulses:      Radial pulses are 2+ on the right side, and 2+ on the left side.  Pulmonary/Chest: Effort normal and breath sounds normal. No respiratory distress. He has no wheezes. He has no rales.  Abdominal: Soft. Bowel sounds are normal. He exhibits no distension and no mass. There is no tenderness. There is no rebound and no guarding.  Musculoskeletal: Normal range of motion. He exhibits no edema.    Lymphadenopathy:    He has no cervical adenopathy.  Neurological: He is alert and oriented to person, place, and time.  CN grossly intact, station and gait intact Recall 3/3 Calculation 5/5, serial 7s  Skin: Skin is warm and dry. No rash noted.  Psychiatric: He has a normal mood and affect. His behavior is normal. Judgment and thought content normal.   Results for orders placed in visit on 04/10/13  LIPID PANEL      Result Value Ref Range   Cholesterol 220 (*) 0 - 200 mg/dL   Triglycerides 104.0  0.0 - 149.0 mg/dL   HDL 52.40  >39.00 mg/dL   VLDL 20.8  0.0 - 40.0 mg/dL   Total CHOL/HDL Ratio 4    BASIC METABOLIC PANEL      Result Value Ref Range   Sodium 140  135 - 145 mEq/L   Potassium 3.9  3.5 - 5.1 mEq/L   Chloride 107  96 - 112 mEq/L   CO2 26  19 - 32 mEq/L   Glucose, Bld 92  70 - 99 mg/dL   BUN 22  6 - 23 mg/dL   Creatinine, Ser 1.1  0.4 - 1.5 mg/dL   Calcium 8.9  8.4 - 10.5 mg/dL   GFR 72.24  >60.00 mL/min  TSH      Result Value Ref Range   TSH 1.48  0.35 - 5.50 uIU/mL  LDL CHOLESTEROL, DIRECT      Result Value Ref Range   Direct LDL 148.8        Assessment & Plan:   Problem List Items Addressed This Visit   TREMOR, ESSENTIAL     Stable on primadone. Continue med.    SEASONAL AFFECTIVE DISORDER   Medicare annual wellness visit, subsequent - Primary     I have personally reviewed the Medicare Annual Wellness questionnaire and have noted 1. The patient's medical and social history 2. Their use of alcohol, tobacco or illicit drugs 3. Their current medications and supplements 4. The patient's functional ability including ADL's, fall risks, home safety risks and hearing or visual impairment. 5. Diet and physical activity 6. Evidence for depression or mood disorders The patients weight, height, BMI have been recorded in the chart.  Hearing and vision has been addressed. I have made referrals, counseling and provided education to the patient based review of the  above and I have provided the pt with a written personalized care plan for preventive services. See scanned questionairre. Advanced directives discussed: reviewed and ensured UTD.  Reviewed preventative protocols and updated unless pt declined Declines prostate, colon cancer screening. prevnar today.    MDD (major depressive disorder)     Chronic, stable on paxil. Discussed possible decreased dose in future but pt desires to continue current treatment as effective.    INSOMNIA, CHRONIC     Chronic, stable. Continue ambien which pt takes 1/2 to 1/3 pill nightly with good effect.    RESOLVED: HYPERTENSION     Stable off meds - only cardura.  Pt denies h/o this, states mainly takes cardura for BPH. Will resolve.    Relevant Orders      Basic metabolic panel   HYPERCHOLESTEROLEMIA     Recheck FLP when returns fasting.    Relevant Orders      Lipid panel   BENIGN PROSTATIC HYPERTROPHY     Continue cardura - stable.        Follow up plan: Return in about 1 year (around 04/18/2015), or as needed, for annual exam, prior fasting for blood work.

## 2014-04-17 NOTE — Telephone Encounter (Signed)
Relevant patient education mailed to patient.  

## 2014-04-17 NOTE — Addendum Note (Signed)
Addended by: Royann Shivers A on: 04/17/2014 12:28 PM   Modules accepted: Orders

## 2014-04-17 NOTE — Assessment & Plan Note (Signed)
Recheck FLP when returns fasting. 

## 2014-04-17 NOTE — Assessment & Plan Note (Signed)
Stable off meds - only cardura. Pt denies h/o this, states mainly takes cardura for BPH. Will resolve.

## 2014-04-17 NOTE — Progress Notes (Signed)
Pre visit review using our clinic review tool, if applicable. No additional management support is needed unless otherwise documented below in the visit note. 

## 2014-04-17 NOTE — Assessment & Plan Note (Signed)
Chronic, stable on paxil. Discussed possible decreased dose in future but pt desires to continue current treatment as effective.

## 2014-05-03 ENCOUNTER — Other Ambulatory Visit: Payer: Self-pay | Admitting: Family Medicine

## 2014-05-06 ENCOUNTER — Other Ambulatory Visit (INDEPENDENT_AMBULATORY_CARE_PROVIDER_SITE_OTHER): Payer: Medicare HMO

## 2014-05-06 DIAGNOSIS — E7800 Pure hypercholesterolemia, unspecified: Secondary | ICD-10-CM

## 2014-05-06 DIAGNOSIS — I10 Essential (primary) hypertension: Secondary | ICD-10-CM

## 2014-05-06 LAB — BASIC METABOLIC PANEL
BUN: 25 mg/dL — AB (ref 6–23)
CHLORIDE: 108 meq/L (ref 96–112)
CO2: 27 mEq/L (ref 19–32)
Calcium: 8.7 mg/dL (ref 8.4–10.5)
Creatinine, Ser: 1 mg/dL (ref 0.4–1.5)
GFR: 80.86 mL/min (ref >60.00)
GLUCOSE: 91 mg/dL (ref 70–99)
POTASSIUM: 4.1 meq/L (ref 3.5–5.1)
Sodium: 141 mEq/L (ref 135–145)

## 2014-05-06 LAB — LIPID PANEL
CHOLESTEROL: 172 mg/dL (ref 0–200)
HDL: 50.5 mg/dL (ref >39.00)
LDL Cholesterol: 111 mg/dL — ABNORMAL HIGH (ref 0–99)
NonHDL: 121.5
TRIGLYCERIDES: 54 mg/dL (ref 0.0–149.0)
Total CHOL/HDL Ratio: 3
VLDL: 10.8 mg/dL (ref 0.0–40.0)

## 2014-05-08 ENCOUNTER — Encounter: Payer: Self-pay | Admitting: *Deleted

## 2014-05-21 ENCOUNTER — Other Ambulatory Visit: Payer: Self-pay | Admitting: Family Medicine

## 2014-05-21 NOTE — Telephone Encounter (Signed)
plz phone in. 

## 2014-05-21 NOTE — Telephone Encounter (Signed)
Ok to refill 

## 2014-05-22 NOTE — Telephone Encounter (Signed)
Phoned in to pharmacy. 

## 2014-07-14 ENCOUNTER — Other Ambulatory Visit: Payer: Self-pay | Admitting: Family Medicine

## 2014-07-23 ENCOUNTER — Other Ambulatory Visit: Payer: Self-pay | Admitting: Family Medicine

## 2014-07-23 NOTE — Telephone Encounter (Signed)
Ok to refill 

## 2014-07-24 NOTE — Telephone Encounter (Signed)
Plz phone in

## 2014-07-24 NOTE — Telephone Encounter (Signed)
Rx called in as directed.   

## 2014-10-21 ENCOUNTER — Encounter: Payer: Self-pay | Admitting: Family Medicine

## 2014-10-21 ENCOUNTER — Ambulatory Visit (INDEPENDENT_AMBULATORY_CARE_PROVIDER_SITE_OTHER): Payer: Medicare HMO | Admitting: Family Medicine

## 2014-10-21 VITALS — BP 138/80 | HR 76 | Temp 97.9°F | Wt 142.5 lb

## 2014-10-21 DIAGNOSIS — R531 Weakness: Secondary | ICD-10-CM

## 2014-10-21 DIAGNOSIS — F411 Generalized anxiety disorder: Secondary | ICD-10-CM

## 2014-10-21 MED ORDER — ALPRAZOLAM 0.25 MG PO TABS: ORAL_TABLET | ORAL | Status: DC

## 2014-10-21 NOTE — Assessment & Plan Note (Signed)
Xanax refilled - rare use. Last filled #60 08/2013.

## 2014-10-21 NOTE — Progress Notes (Signed)
Pre visit review using our clinic review tool, if applicable. No additional management support is needed unless otherwise documented below in the visit note. 

## 2014-10-21 NOTE — Assessment & Plan Note (Signed)
?  hypoglycemia related from skipped lunch daily. Suggested protein filled snack if he doesn't eat lunch. Will check glu when fasting tomorrow, as well as CBC and TSH Pt agrees with plan.

## 2014-10-21 NOTE — Patient Instructions (Signed)
Alprazolam refilled. Return tomorrow for blood work.

## 2014-10-21 NOTE — Progress Notes (Signed)
BP 138/80 mmHg  Pulse 76  Temp(Src) 97.9 F (36.6 C) (Oral)  Wt 142 lb 8 oz (64.638 kg)   CC: 6 mo f/u visit  Subjective:    Patient ID: Rodney Branch, male    DOB: [birth year], 80 y.o.   MRN: [id number]  HPI: Rodney Branch is a 80 y.o. male presenting on 10/21/2014 for Follow-up   Several year h/o intermittent knee weakness. Also noticing small checkered flags in vision around vision perimetry. improved with small candy bar. No polyuria, polyphagia, polydipsia. No appetite or weight changes.   Has decreased portion sizes to lose weight. Eats bran for breakfast, skips lunch (longstanding) and then eats good dinner. Wt Readings from Last 3 Encounters:  10/21/14 142 lb 8 oz (64.638 kg)  04/17/14 145 lb 4 oz (65.885 kg)  01/27/14 144 lb 4 oz (65.431 kg)  Body mass index is 23.01 kg/(m^2).  Chronic insomnia - takes 1/2 to 1/3 ambien 10mg . Helping him sleep well.  Depression - on paxil 40mg  daily (increased by Dr. Clovis Pu psych in [place] 1-2 yrs ago) - doing well with this. Longstanding h/o recurrent MDD since age 59yo. 1 self referred psychiatric hospitalization  Relevant past medical, surgical, family and social history reviewed and updated as indicated.  Allergies and medications reviewed and updated. Current Outpatient Prescriptions on File Prior to Visit  Medication Sig  . aspirin 81 MG tablet Take 81 mg by mouth daily.    . cholecalciferol (VITAMIN D) 1000 UNITS tablet Take 2,000 Units by mouth daily.  . cyanocobalamin 2000 MCG tablet Take 2,000 mcg by mouth daily.  [name] doxazosin (CARDURA) 1 MG tablet TAKE 1 TABLET (1 MG TOTAL) BY MOUTH DAILY.  . fexofenadine (ALLEGRA) 180 MG tablet Take 180 mg by mouth daily as needed.   . fluticasone (FLONASE) 50 MCG/ACT nasal spray Place 2 sprays into both nostrils daily as needed.  [name] PARoxetine (PAXIL) 40 MG tablet TAKE 1 TABLET (40 MG TOTAL) BY MOUTH EVERY MORNING.  . primidone (MYSOLINE) 50 MG tablet TAKE 1 TABLET BY MOUTH TWICE A DAY  .  zolpidem (AMBIEN) 10 MG tablet TAKE 1 TABLET BY MOUTH AT BEDTIME AS NEEDED   No current facility-administered medications on file prior to visit.   Past Medical History  Diagnosis Date  . Depression 1996    Psych Admit ([place]) 1996  . Hypertension 1996  . Benign prostatic hypertrophy 1996  . Tremor, essential   . Psychiatric hospitalization 12/1994    self referral/anxiety depression  . INSOMNIA, CHRONIC 05/20/2009  . Malignant melanoma 2015    R abd ([place])    Review of Systems Per HPI unless specifically indicated above    Objective:    BP 138/80 mmHg  Pulse 76  Temp(Src) 97.9 F (36.6 C) (Oral)  Wt 142 lb 8 oz (64.638 kg)  Physical Exam  Constitutional: He appears well-developed and well-nourished. No distress.  HENT:  Head: Normocephalic and atraumatic.  Mouth/Throat: Oropharynx is clear and moist. No oropharyngeal exudate.  Eyes: Conjunctivae and EOM are normal. Pupils are equal, round, and reactive to light. No scleral icterus.  Neck: Normal range of motion. Neck supple. Carotid bruit is not present. No thyromegaly present.  Cardiovascular: Normal rate, regular rhythm, normal heart sounds and intact distal pulses.   No murmur heard. Pulmonary/Chest: Effort normal and breath sounds normal. No respiratory distress. He has no wheezes. He has no rales.  Musculoskeletal: He exhibits no edema.  Lymphadenopathy:    He has no  cervical adenopathy.  Skin: Skin is warm and dry.  Psychiatric: He has a normal mood and affect.  Nursing note and vitals reviewed.  Results for orders placed or performed in visit on 05/06/14  Lipid panel  Result Value Ref Range   Cholesterol 172 0 - 200 mg/dL   Triglycerides 54.0 0.0 - 149.0 mg/dL   HDL 50.50 >39.00 mg/dL   VLDL 10.8 0.0 - 40.0 mg/dL   LDL Cholesterol 111 (H) 0 - 99 mg/dL   Total CHOL/HDL Ratio 3    NonHDL 213.08   Basic metabolic panel  Result Value Ref Range   Sodium 141 135 - 145 mEq/L   Potassium 4.1 3.5 - 5.1  mEq/L   Chloride 108 96 - 112 mEq/L   CO2 27 19 - 32 mEq/L   Glucose, Bld 91 70 - 99 mg/dL   BUN 25 (H) 6 - 23 mg/dL   Creatinine, Ser 1.0 0.4 - 1.5 mg/dL   Calcium 8.7 8.4 - 10.5 mg/dL   GFR 80.86 >60.00 mL/min      Assessment & Plan:   Problem List Items Addressed This Visit    Weakness - Primary    ?hypoglycemia related from skipped lunch daily. Suggested protein filled snack if he doesn't eat lunch. Will check glu when fasting tomorrow, as well as CBC and TSH Pt agrees with plan.    Relevant Orders      TSH      CBC with Differential      Basic metabolic panel   Anxiety state    Xanax refilled - rare use. Last filled #60 08/2013.    Relevant Medications      ALPRAZolam  (XANAX) tablet       Follow up plan: Return as needed.

## 2014-10-22 ENCOUNTER — Other Ambulatory Visit (INDEPENDENT_AMBULATORY_CARE_PROVIDER_SITE_OTHER): Payer: Medicare HMO

## 2014-10-22 DIAGNOSIS — R531 Weakness: Secondary | ICD-10-CM

## 2014-10-22 LAB — CBC WITH DIFFERENTIAL/PLATELET
BASOS ABS: 0 10*3/uL (ref 0.0–0.1)
Basophils Relative: 0.5 % (ref 0.0–3.0)
EOS ABS: 0.1 10*3/uL (ref 0.0–0.7)
Eosinophils Relative: 2.6 % (ref 0.0–5.0)
HEMATOCRIT: 43.4 % (ref 39.0–52.0)
HEMOGLOBIN: 14.3 g/dL (ref 13.0–17.0)
Lymphocytes Relative: 26.4 % (ref 12.0–46.0)
Lymphs Abs: 1.2 10*3/uL (ref 0.7–4.0)
MCHC: 32.8 g/dL (ref 30.0–36.0)
MCV: 94.4 fl (ref 78.0–100.0)
Monocytes Absolute: 0.6 10*3/uL (ref 0.1–1.0)
Monocytes Relative: 12.8 % — ABNORMAL HIGH (ref 3.0–12.0)
NEUTROS ABS: 2.6 10*3/uL (ref 1.4–7.7)
Neutrophils Relative %: 57.7 % (ref 43.0–77.0)
Platelets: 285 10*3/uL (ref 150.0–400.0)
RBC: 4.6 Mil/uL (ref 4.22–5.81)
RDW: 13.8 % (ref 11.5–15.5)
WBC: 4.5 10*3/uL (ref 4.0–10.5)

## 2014-10-22 LAB — TSH: TSH: 1.72 u[IU]/mL (ref 0.35–4.50)

## 2014-10-22 LAB — BASIC METABOLIC PANEL
BUN: 26 mg/dL — ABNORMAL HIGH (ref 6–23)
CO2: 24 mEq/L (ref 19–32)
CREATININE: 1 mg/dL (ref 0.4–1.5)
Calcium: 8.8 mg/dL (ref 8.4–10.5)
Chloride: 107 mEq/L (ref 96–112)
GFR: 74.41 mL/min (ref >60.00)
Glucose, Bld: 95 mg/dL (ref 70–99)
Potassium: 4.4 mEq/L (ref 3.5–5.1)
Sodium: 139 mEq/L (ref 135–145)

## 2014-10-27 ENCOUNTER — Encounter: Payer: Self-pay | Admitting: *Deleted

## 2014-11-03 ENCOUNTER — Other Ambulatory Visit: Payer: Self-pay | Admitting: Family Medicine

## 2014-11-11 ENCOUNTER — Ambulatory Visit (INDEPENDENT_AMBULATORY_CARE_PROVIDER_SITE_OTHER): Payer: Medicare HMO | Admitting: Family Medicine

## 2014-11-11 ENCOUNTER — Encounter: Payer: Self-pay | Admitting: *Deleted

## 2014-11-11 ENCOUNTER — Encounter: Payer: Self-pay | Admitting: Family Medicine

## 2014-11-11 VITALS — BP 120/70 | HR 92 | Temp 98.1°F | Wt 140.5 lb

## 2014-11-11 DIAGNOSIS — M179 Osteoarthritis of knee, unspecified: Secondary | ICD-10-CM | POA: Insufficient documentation

## 2014-11-11 DIAGNOSIS — M25561 Pain in right knee: Secondary | ICD-10-CM

## 2014-11-11 DIAGNOSIS — M129 Arthropathy, unspecified: Secondary | ICD-10-CM

## 2014-11-11 DIAGNOSIS — M1710 Unilateral primary osteoarthritis, unspecified knee: Secondary | ICD-10-CM | POA: Insufficient documentation

## 2014-11-11 NOTE — Patient Instructions (Addendum)
I think you have lateral collateral ligament sprain on right. Treat with elevation, rest, and may use tylenol for discomfort. May do strengthening exercises provided today. For arthritis (left knee) - start vitamin D 1000 units daily as well as may try osteo biflex. Continue tylenol for discomfort.  Urine drug screen today as we're due.

## 2014-11-11 NOTE — Progress Notes (Signed)
Pre visit review using our clinic review tool, if applicable. No additional management support is needed unless otherwise documented below in the visit note. 

## 2014-11-11 NOTE — Assessment & Plan Note (Signed)
Known left side - suggested trial of vit D 1000 and osteo biflex.

## 2014-11-11 NOTE — Assessment & Plan Note (Signed)
Exam most consistent with R lateral collateral ligament sprain. Discussed dx and treatment options. Provided with exercises from [facility] pt advisor. rec tylenol, ice/heating pad, elevation, rest. Update if not improving as expected for further evaluation.

## 2014-11-11 NOTE — Progress Notes (Signed)
BP 120/70 mmHg  Pulse 92  Temp(Src) 98.1 F (36.7 C) (Oral)  Wt 140 lb 8 oz (63.73 kg)   CC: discuss arthritis, knee pain  Subjective:    Patient ID: Rodney Branch, male    DOB: [birth year], 80 y.o.   MRN: [id number]  HPI: Rodney Branch is a 80 y.o. male presenting on 11/11/2014 for Knee Pain   Over last week noticing anterior and posterior R knee pain worse at night time. Unable to fully extend at R knee. Started taking glucosamine 1 wk ago, tylenol arthritis. Denies inciting trauma/injury, no instability on right, no locking.   Ongoing L knee arthritis for years. Worse with prolonged walking - uses brace when walking or bowling. +instability.   Chronic prostatitis - since young adult. Recently noted worsening dysuria and frequency so he was evaluated by urology Dr Jeffie Pollock who started him on cardura 4mg  daily.   Relevant past medical, surgical, family and social history reviewed and updated as indicated. Interim medical history since our last visit reviewed. Allergies and medications reviewed and updated. Current Outpatient Prescriptions on File Prior to Visit  Medication Sig  . ALPRAZolam (XANAX) 0.25 MG tablet TAKE 1 TABLET BY MOUTH TWICE A DAY AS NEEDED FOR ANXIETY  . aspirin 81 MG tablet Take 81 mg by mouth daily.    . cyanocobalamin 2000 MCG tablet Take 1,000 mcg by mouth daily.   . fexofenadine (ALLEGRA) 180 MG tablet Take 180 mg by mouth daily as needed.   [name] PARoxetine (PAXIL) 40 MG tablet TAKE 1 TABLET (40 MG TOTAL) BY MOUTH EVERY MORNING.  . primidone (MYSOLINE) 50 MG tablet TAKE 1 TABLET BY MOUTH TWICE A DAY  . zolpidem (AMBIEN) 10 MG tablet TAKE 1 TABLET BY MOUTH AT BEDTIME AS NEEDED  . cholecalciferol (VITAMIN D) 1000 UNITS tablet Take 1,000 Units by mouth daily.   . fluticasone (FLONASE) 50 MCG/ACT nasal spray Place 2 sprays into both nostrils daily as needed.   No current facility-administered medications on file prior to visit.    Review of Systems Per HPI unless  specifically indicated above     Objective:    BP 120/70 mmHg  Pulse 92  Temp(Src) 98.1 F (36.7 C) (Oral)  Wt 140 lb 8 oz (63.73 kg)  Wt Readings from Last 3 Encounters:  11/11/14 140 lb 8 oz (63.73 kg)  10/21/14 142 lb 8 oz (64.638 kg)  04/17/14 145 lb 4 oz (65.885 kg)    Physical Exam  Constitutional: He appears well-developed and well-nourished. No distress.  Musculoskeletal: He exhibits no edema.  L knee WNL R Knee exam: No deformity on inspection. Tender to palpation of lateral joint line and at LCL Swelling of L knee noted. Limited ROM in flex/extension 2/2 pain  No popliteal fullness. Neg drawer test. Neg mcmurray test. No pain with valgus/varus stress. No PFgrind. No abnormal patellar mobility.  Nursing note and vitals reviewed.      Assessment & Plan:  Pt will fill out controlled substance agreement form and UDS today.  Problem List Items Addressed This Visit    Right knee pain - Primary    Exam most consistent with R lateral collateral ligament sprain. Discussed dx and treatment options. Provided with exercises from [facility] pt advisor. rec tylenol, ice/heating pad, elevation, rest. Update if not improving as expected for further evaluation.    Arthritis of knee    Known left side - suggested trial of vit D 1000 and osteo biflex.  Follow up plan: Return if symptoms worsen or fail to improve.

## 2014-11-18 ENCOUNTER — Other Ambulatory Visit: Payer: Self-pay | Admitting: Family Medicine

## 2014-11-18 NOTE — Telephone Encounter (Signed)
Ok to refill in Dr. Synthia Innocent absence? Last filled 07/24/14 #30 3RF

## 2014-11-19 NOTE — Telephone Encounter (Signed)
Please call in.  Thanks.   

## 2014-11-19 NOTE — Telephone Encounter (Signed)
Rx called in as directed.   

## 2014-11-20 ENCOUNTER — Other Ambulatory Visit: Payer: Self-pay | Admitting: Family Medicine

## 2014-11-20 NOTE — Telephone Encounter (Signed)
Ok to refill in Dr. Synthia Innocent absence? Last filled 10/21/14 #60 0RF

## 2014-11-20 NOTE — Telephone Encounter (Signed)
Low risk per records, due for refill.

## 2014-11-20 NOTE — Telephone Encounter (Signed)
Rx called in as directed.   

## 2014-12-02 ENCOUNTER — Encounter: Payer: Self-pay | Admitting: Family Medicine

## 2014-12-02 ENCOUNTER — Telehealth: Payer: Self-pay | Admitting: Family Medicine

## 2014-12-02 ENCOUNTER — Ambulatory Visit (INDEPENDENT_AMBULATORY_CARE_PROVIDER_SITE_OTHER): Payer: Medicare HMO | Admitting: Family Medicine

## 2014-12-02 VITALS — BP 154/82 | HR 76 | Temp 97.3°F | Wt 137.5 lb

## 2014-12-02 DIAGNOSIS — R413 Other amnesia: Secondary | ICD-10-CM | POA: Insufficient documentation

## 2014-12-02 DIAGNOSIS — G4700 Insomnia, unspecified: Secondary | ICD-10-CM

## 2014-12-02 DIAGNOSIS — F331 Major depressive disorder, recurrent, moderate: Secondary | ICD-10-CM

## 2014-12-02 MED ORDER — DONEPEZIL HCL 5 MG PO TABS: 5.0000 mg | ORAL_TABLET | Freq: Every day | ORAL | Status: DC

## 2014-12-02 MED ORDER — ZOLPIDEM TARTRATE 5 MG PO TABS: 5.0000 mg | ORAL_TABLET | Freq: Every evening | ORAL | Status: DC | PRN

## 2014-12-02 NOTE — Assessment & Plan Note (Addendum)
There is some concern for early dementia possibly alzheimer type with noted deficits in calculation/concentration upon testing and executive functioning on story. He also has family history of alz dz. Pt declines lab workup today - and has had normal TSH, B12 levels recently. Also declines head imaging for now.  Reviewed concerns with patient and wife.  Will start aricept 5mg  daily.  Not clear cut dx dementia today (given significant stress upon testing today) but some signs of this. Will continue to monitor for now, trial aricept, and return in 4-6 wks for f/u and assess response to aricept.

## 2014-12-02 NOTE — Progress Notes (Signed)
BP 154/82 mmHg  Pulse 76  Temp(Src) 97.3 F (36.3 C) (Oral)  Wt 137 lb 8 oz (62.37 kg)   CC: discuss some concerns  Subjective:    Patient ID: Rodney Branch, male    DOB: Dec 16, 1930, 81 y.o.   MRN: [id number]  HPI: Rodney Branch is a 81 y.o. male presenting on 12/02/2014 for Memory Loss   Stylianos presents today with wife who has several concerns. Husband keeps saying he's going to die. 1.5 wks ago started cleaning out garage and cleaning out desk indiscriminately. He's discarded several financial medical records away - pt endorses he didn't feel he needed to keep these records. Had a 1 hour conversation with wife yesterday, doesn't remember details. He threw check away yesterday he was supposed to mail to son - but he told his son he was going to throw it away. Notices he is calmer than normal, even some apathy.   Pt describes rational train of thought behind his decision to distribute recent annuity among sons.  During recent AMW had normal calculation and recall on testing.   H/o depression on paxil 40mg  daily and tolerating this well.  Chronic insomnia - chronically on 10mg  daily.   BP elevated today but normal on all previous recent office visits.  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 Interim medical history since our last visit reviewed. Allergies and medications reviewed and  updated. Current Outpatient Prescriptions on File Prior to Visit  Medication Sig  . ALPRAZolam (XANAX) 0.25 MG tablet TAKE 1 TABLET BY MOUTH TWICE A DAY AS NEEDED ANXIETY  . aspirin 81 MG tablet Take 81 mg by mouth daily.    . cholecalciferol (VITAMIN D) 1000 UNITS tablet Take 1,000 Units by mouth daily.   . cyanocobalamin 2000 MCG tablet Take 1,000 mcg by mouth daily.   [name] doxazosin (CARDURA) 4 MG tablet Take 4 mg by mouth daily.  . fexofenadine (ALLEGRA) 180 MG tablet Take 180 mg by mouth daily as needed.   . fluticasone (FLONASE) 50 MCG/ACT nasal spray Place 2 sprays into both nostrils daily as needed.  [name] PARoxetine (PAXIL) 40 MG tablet TAKE 1 TABLET (40 MG TOTAL) BY MOUTH EVERY MORNING.  . primidone (MYSOLINE) 50 MG tablet TAKE 1 TABLET BY MOUTH TWICE A DAY   No current facility-administered medications on file prior to visit.    Review of Systems Per HPI unless specifically indicated above     Objective:    BP 154/82 mmHg  Pulse 76  Temp(Src) 97.3 F (36.3 C) (Oral)  Wt 137 lb 8 oz (62.37 kg)  Wt Readings from Last 3 Encounters:  12/02/14 137 lb 8 oz (62.37 kg)  11/11/14 140 lb 8 oz (63.73 kg)  10/21/14 142 lb 8 oz (64.638 kg)    Physical Exam  Constitutional: He appears well-developed and well-nourished. No distress.  Psychiatric: His mood appears anxious.  Nervous, tearful with discussion of worries about dementia  Nursing note and vitals reviewed.  Results for orders  placed or performed in visit on 10/22/14  TSH  Result Value Ref Range   TSH 1.72 0.35 - 4.50 uIU/mL  CBC with Differential  Result Value Ref Range   WBC 4.5 4.0 - 10.5 K/uL   RBC 4.60 4.22 - 5.81 Mil/uL   Hemoglobin 14.3 13.0 - 17.0 g/dL   HCT 43.4 39.0 - 52.0 %   MCV 94.4 78.0 - 100.0 fl   MCHC 32.8 30.0 - 36.0 g/dL   RDW 13.8 11.5 - 15.5 %   Platelets 285.0 150.0 - 400.0 K/uL   Neutrophils Relative % 57.7 43.0 - 77.0 %   Lymphocytes Relative 26.4 12.0 - 46.0 %   Monocytes Relative 12.8 (H)  3.0 - 12.0 %   Eosinophils Relative 2.6 0.0 - 5.0 %   Basophils Relative 0.5 0.0 - 3.0 %   Neutro Abs 2.6 1.4 - 7.7 K/uL   Lymphs Abs 1.2 0.7 - 4.0 K/uL   Monocytes Absolute 0.6 0.1 - 1.0 K/uL   Eosinophils Absolute 0.1 0.0 - 0.7 K/uL   Basophils Absolute 0.0 0.0 - 0.1 K/uL  Basic metabolic panel  Result Value Ref Range   Sodium 139 135 - 145 mEq/L   Potassium 4.4 3.5 - 5.1 mEq/L   Chloride 107 96 - 112 mEq/L   CO2 24 19 - 32 mEq/L   Glucose, Bld 95 70 - 99 mg/dL   BUN 26 (H) 6 - 23 mg/dL   Creatinine, Ser 1.0 0.4 - 1.5 mg/dL   Calcium 8.8 8.4 - 10.5 mg/dL   GFR 74.41 >60.00 mL/min      Assessment & Plan:   Problem List Items Addressed This Visit    Memory deficit - Primary    There is some concern for early dementia possibly alzheimer type with noted deficits in calculation/concentration upon testing and executive functioning on story. He also has family history of alz dz. Pt declines lab workup today - and has had normal TSH, B12 levels recently. Also declines head imaging for now.  Reviewed concerns with patient and wife.  Will start aricept 5mg  daily.  Not clear cut dx dementia today (given significant stress upon testing today) but some signs of this. Will continue to monitor for now, trial aricept, and return in 4-6 wks for f/u and assess response to aricept.    MDD (major depressive disorder)    Chronic, stable on paxil 40mg  daily. Continue regimen.    INSOMNIA, CHRONIC    Will decrease ambien to 5mg  daily   Pt agrees with plan.        Follow up plan: Return in about 4 weeks (around 12/30/2014), or as needed, for follow up visit.

## 2014-12-02 NOTE — Assessment & Plan Note (Signed)
Will decrease ambien to 5mg  daily   Pt agrees with plan.

## 2014-12-02 NOTE — Telephone Encounter (Signed)
[name] wife called and left vm on triage phone stating that pt saw Dr. Danise Mina today and was given RX for sleeping medication.  He has lost the RX and they want to know if he can have another.

## 2014-12-02 NOTE — Assessment & Plan Note (Signed)
Chronic, stable on paxil 40mg  daily. Continue regimen.

## 2014-12-02 NOTE — Progress Notes (Signed)
Pre visit review using our clinic review tool, if applicable. No additional management support is needed unless otherwise documented below in the visit note. 

## 2014-12-02 NOTE — Telephone Encounter (Signed)
Printed and placed in [name]'s box. 

## 2014-12-02 NOTE — Patient Instructions (Signed)
Let's decrease ambien to 5mg  daily. Let's start aricept 5mg  daily - watch for headache or nausea. Return in 4-6 weeks for follow up.

## 2014-12-03 ENCOUNTER — Telehealth: Payer: Self-pay | Admitting: *Deleted

## 2014-12-03 ENCOUNTER — Emergency Department: Payer: Self-pay | Admitting: Student

## 2014-12-03 DIAGNOSIS — F332 Major depressive disorder, recurrent severe without psychotic features: Secondary | ICD-10-CM

## 2014-12-03 DIAGNOSIS — F411 Generalized anxiety disorder: Secondary | ICD-10-CM

## 2014-12-03 DIAGNOSIS — R413 Other amnesia: Secondary | ICD-10-CM

## 2014-12-03 LAB — COMPREHENSIVE METABOLIC PANEL
ANION GAP: 8 (ref 7–16)
Albumin: 3.7 g/dL (ref 3.4–5.0)
Alkaline Phosphatase: 94 U/L
BILIRUBIN TOTAL: 0.8 mg/dL (ref 0.2–1.0)
BUN: 16 mg/dL (ref 7–18)
CALCIUM: 8.3 mg/dL — AB (ref 8.5–10.1)
CO2: 24 mmol/L (ref 21–32)
CREATININE: 1.13 mg/dL (ref 0.60–1.30)
Chloride: 111 mmol/L — ABNORMAL HIGH (ref 98–107)
Glucose: 103 mg/dL — ABNORMAL HIGH (ref 65–99)
OSMOLALITY: 286 (ref 275–301)
Potassium: 3.4 mmol/L — ABNORMAL LOW (ref 3.5–5.1)
SGOT(AST): 23 U/L (ref 15–37)
SGPT (ALT): 22 U/L
Sodium: 143 mmol/L (ref 136–145)
Total Protein: 6.8 g/dL (ref 6.4–8.2)

## 2014-12-03 LAB — CBC
HCT: 44 % (ref 40.0–52.0)
HGB: 14.3 g/dL (ref 13.0–18.0)
MCH: 31.1 pg (ref 26.0–34.0)
MCHC: 32.4 g/dL (ref 32.0–36.0)
MCV: 96 fL (ref 80–100)
Platelet: 262 10*3/uL (ref 150–440)
RBC: 4.59 10*6/uL (ref 4.40–5.90)
RDW: 13.5 % (ref 11.5–14.5)
WBC: 4.6 10*3/uL (ref 3.8–10.6)

## 2014-12-03 LAB — CBC AND DIFFERENTIAL
HEMOGLOBIN: 14.3 g/dL (ref 13.5–17.5)
PLATELETS: 262 10*3/uL (ref 150–399)
WBC: 4.6 10^3/mL

## 2014-12-03 LAB — BASIC METABOLIC PANEL
CREATININE: 1.1 mg/dL (ref <=1.3)
Glucose: 103 mg/dL
Potassium: 3.4 mmol/L (ref 3.4–5.3)
Sodium: 143 mmol/L (ref 137–147)

## 2014-12-03 LAB — HEPATIC FUNCTION PANEL
ALT: 22 U/L (ref 10–40)
AST: 23 U/L (ref 14–40)
Alkaline Phosphatase: 94 U/L (ref 25–125)
Bilirubin, Total: 0.8 mg/dL

## 2014-12-03 LAB — ACETAMINOPHEN LEVEL: Acetaminophen: <2 ug/mL

## 2014-12-03 LAB — TSH
TSH: 2.92 u[IU]/mL (ref <=5.90)
Thyroid Stimulating Horm: 2.92 u[IU]/mL

## 2014-12-03 LAB — ETHANOL: Ethanol: <3 mg/dL

## 2014-12-03 LAB — SALICYLATE LEVEL: Salicylates, Serum: <1.7 mg/dL

## 2014-12-03 NOTE — Telephone Encounter (Signed)
referral placed

## 2014-12-03 NOTE — Addendum Note (Signed)
Addended by: Ria Bush on: 12/03/2014 05:10 PM   Modules accepted: Orders
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
## 2014-12-03 NOTE — Telephone Encounter (Addendum)
V/M left that pt has just gotten home from [facility] ED and request Dr Darnell Level to refer pt to psychiatrist in [place] for an afternoon appt. Faxed request to [place] records for ED records for today. Mrs Silveria request cb on 12/04/14.

## 2014-12-03 NOTE — Telephone Encounter (Signed)
Late entry; spoke with wife on phone around 9:45am. Pt on his way to [facility] ER for further evaluation which I agree with.

## 2014-12-03 NOTE — Telephone Encounter (Signed)
Wife aware. Placed up front for pick up.

## 2014-12-04 ENCOUNTER — Ambulatory Visit: Payer: Medicare HMO | Admitting: Sports Medicine

## 2014-12-04 NOTE — Telephone Encounter (Addendum)
Spoke with wife this morning. She doesn't think he will go to psych and requests we cancel this.  Husband went to barber shop this morning.  Advised to take aricept for next month and f/u with me in 1 month which wife agrees with.

## 2014-12-04 NOTE — Addendum Note (Signed)
Addended by: Ria Bush on: 12/04/2014 10:06 AM   Modules accepted: Orders

## 2014-12-05 ENCOUNTER — Encounter: Payer: Self-pay | Admitting: Family Medicine

## 2014-12-05 NOTE — Telephone Encounter (Signed)
Rodney Branch walked in office and pt is concerned that pts wife has alzheimers and Rodney Branch either hid or destroyed his zolpidem that pt had. Spoke with Dr Darnell Level and he said to let pt sign for his zolpidem rx and request phone # for pts son to discuss home situation. Rodney Branch signed for zolpidem rx and will take to [name]; Rodney Branch said it is OK for Dr Darnell Level to contact his son or daughter in law who lives in [name], [name]. Sons name is Rodney Branch and his wife is Rodney Branch. [name] and [name] home # is [phone].

## 2014-12-05 NOTE — Telephone Encounter (Signed)
Called and left message.

## 2014-12-05 NOTE — Telephone Encounter (Signed)
Called son, went to voicemail. Will try later.

## 2014-12-07 ENCOUNTER — Encounter: Payer: Self-pay | Admitting: Family Medicine

## 2014-12-08 ENCOUNTER — Telehealth: Payer: Self-pay | Admitting: Family Medicine

## 2014-12-08 NOTE — Telephone Encounter (Signed)
Rodney Branch called to let you know that Rodney Branch is not doing any better.   He picked up his rx of abeim.  On Friday  He would not give bottle to Rodney Branch.  She counted the meds this morning he only has 18  Pills left.  On Friday she thinks he took 40mg  on Friday evening.  Saturday he took 30mg  and Sunday he took 50 mg  She not for he is taking the meds or throwing them away.  She stated he doesn't look good.   Sent to [facility]

## 2014-12-08 NOTE — Telephone Encounter (Signed)
Called and spoke with wife. Son Rodney Branch is here.  [name] is doing well during the day, worse at night time.  He is overusing ambien. Last filled ambien script #30 on 12/02/2014. But sounds like 10mg  dose was filled instead of 5mg  daily. Support provided.

## 2014-12-08 NOTE — Telephone Encounter (Signed)
Spoke with daughter in law who corroborates Pat's story - son is in [place] traveling for work. No concern for Pat's memory deficit per DIL.

## 2014-12-08 NOTE — Telephone Encounter (Signed)
PLEASE NOTE: All timestamps contained within this report are represented as [place] Standard Time. CONFIDENTIALTY NOTICE: This fax transmission is intended only for the addressee. It contains information that is legally privileged, confidential or otherwise protected from use or disclosure. If you are not the intended recipient, you are strictly prohibited from reviewing, disclosing, copying using or disseminating any of this information or taking any action in reliance on or regarding this information. If you have received this fax in error, please notify us immediately by telephone so that we can arrange for its return to [place]. Phone: [phone], Toll-Free: [phone], Fax: [phone] Page: 1 of 1 Call Id: [id number] [place] Patient Name: Rodney Branch Gender: Unknown DOB: (approximate) Age: Return Phone Number: Address: City/State/Zip:  Client [place] [facility] - Client Client Site [place], [place] Type Call [place] Name patrica Tysin Salada Phone Number [phone] Relationship To Patient Spouse Is this call to report lab results? No Call Type General Information Initial Comment Caller States wife needs to talk to dr about husband over taking his ambein pills. was told to call the dr. if things got worse, so she is calling. can someone please give her a call back as soon as possible. pts name is Rodney Branch General Information Type Message Only Nurse Assessment Guidelines Guideline Title Affirmed Question Affirmed Notes Nurse Date/Time (Eastern Time) Disp. Time Eilene Ghazi Time) Disposition Final User 12/08/2014 1:14:46 PM General Information Provided Yes Donato Heinz After Care Instructions Given Call Event Type User Date / Time Description

## 2014-12-10 ENCOUNTER — Encounter (HOSPITAL_COMMUNITY): Payer: Self-pay | Admitting: Emergency Medicine

## 2014-12-10 ENCOUNTER — Telehealth: Payer: Self-pay | Admitting: Family Medicine

## 2014-12-10 ENCOUNTER — Telehealth: Payer: Self-pay | Admitting: *Deleted

## 2014-12-10 ENCOUNTER — Emergency Department (HOSPITAL_COMMUNITY)
Admission: EM | Admit: 2014-12-10 | Discharge: 2014-12-11 | Disposition: A | Discharge status: Other Acute Inpatient Hospital | Payer: Medicare HMO | Attending: Emergency Medicine | Admitting: Emergency Medicine

## 2014-12-10 ENCOUNTER — Emergency Department (HOSPITAL_COMMUNITY): Payer: Medicare HMO

## 2014-12-10 ENCOUNTER — Ambulatory Visit: Payer: Self-pay | Admitting: Family Medicine

## 2014-12-10 DIAGNOSIS — F419 Anxiety disorder, unspecified: Secondary | ICD-10-CM | POA: Insufficient documentation

## 2014-12-10 DIAGNOSIS — Z046 Encounter for general psychiatric examination, requested by authority: Secondary | ICD-10-CM | POA: Diagnosis present

## 2014-12-10 DIAGNOSIS — F1310 Sedative, hypnotic or anxiolytic abuse, uncomplicated: Secondary | ICD-10-CM | POA: Insufficient documentation

## 2014-12-10 DIAGNOSIS — F99 Mental disorder, not otherwise specified: Secondary | ICD-10-CM

## 2014-12-10 DIAGNOSIS — Z85820 Personal history of malignant melanoma of skin: Secondary | ICD-10-CM | POA: Insufficient documentation

## 2014-12-10 DIAGNOSIS — Z79899 Other long term (current) drug therapy: Secondary | ICD-10-CM | POA: Insufficient documentation

## 2014-12-10 DIAGNOSIS — N400 Enlarged prostate without lower urinary tract symptoms: Secondary | ICD-10-CM | POA: Diagnosis not present

## 2014-12-10 DIAGNOSIS — F329 Major depressive disorder, single episode, unspecified: Secondary | ICD-10-CM | POA: Insufficient documentation

## 2014-12-10 DIAGNOSIS — G4700 Insomnia, unspecified: Secondary | ICD-10-CM | POA: Insufficient documentation

## 2014-12-10 DIAGNOSIS — G479 Sleep disorder, unspecified: Secondary | ICD-10-CM | POA: Insufficient documentation

## 2014-12-10 DIAGNOSIS — Z7982 Long term (current) use of aspirin: Secondary | ICD-10-CM | POA: Insufficient documentation

## 2014-12-10 DIAGNOSIS — I10 Essential (primary) hypertension: Secondary | ICD-10-CM | POA: Insufficient documentation

## 2014-12-10 LAB — URINALYSIS, ROUTINE W REFLEX MICROSCOPIC
Bilirubin Urine: NEGATIVE
GLUCOSE, UA: NEGATIVE mg/dL
HGB URINE DIPSTICK: NEGATIVE
Ketones, ur: 40 mg/dL — AB
Leukocytes, UA: NEGATIVE
Nitrite: NEGATIVE
PROTEIN: NEGATIVE mg/dL
Specific Gravity, Urine: 1.023 (ref 1.005–1.030)
Urobilinogen, UA: 1 mg/dL (ref 0.0–1.0)
pH: 6 (ref 5.0–8.0)

## 2014-12-10 LAB — ETHANOL: Alcohol, Ethyl (B): <5 mg/dL (ref 0–9)

## 2014-12-10 LAB — CBC WITH DIFFERENTIAL/PLATELET
Basophils Absolute: 0 10*3/uL (ref 0.0–0.1)
Basophils Relative: 1 % (ref 0–1)
Eosinophils Absolute: 0.1 10*3/uL (ref 0.0–0.7)
Eosinophils Relative: 1 % (ref 0–5)
HCT: 42.8 % (ref 39.0–52.0)
Hemoglobin: 14.4 g/dL (ref 13.0–17.0)
LYMPHS ABS: 0.7 10*3/uL (ref 0.7–4.0)
LYMPHS PCT: 15 % (ref 12–46)
MCH: 31.6 pg (ref 26.0–34.0)
MCHC: 33.6 g/dL (ref 30.0–36.0)
MCV: 93.9 fL (ref 78.0–100.0)
Monocytes Absolute: 0.5 10*3/uL (ref 0.1–1.0)
Monocytes Relative: 10 % (ref 3–12)
NEUTROS ABS: 3.4 10*3/uL (ref 1.7–7.7)
NEUTROS PCT: 73 % (ref 43–77)
Platelets: 282 10*3/uL (ref 150–400)
RBC: 4.56 MIL/uL (ref 4.22–5.81)
RDW: 13.7 % (ref 11.5–15.5)
WBC: 4.6 10*3/uL (ref 4.0–10.5)

## 2014-12-10 LAB — COMPREHENSIVE METABOLIC PANEL
ALBUMIN: 4.4 g/dL (ref 3.5–5.2)
ALK PHOS: 95 U/L (ref 39–117)
ALT: 17 U/L (ref 0–53)
AST: 19 U/L (ref 0–37)
Anion gap: 9 (ref 5–15)
BUN: 22 mg/dL (ref 6–23)
CHLORIDE: 107 meq/L (ref 96–112)
CO2: 23 mmol/L (ref 19–32)
CREATININE: 0.84 mg/dL (ref 0.50–1.35)
Calcium: 9 mg/dL (ref 8.4–10.5)
GFR calc Af Amer: >90 mL/min (ref >90)
GFR, EST NON AFRICAN AMERICAN: 80 mL/min — AB (ref >90)
Glucose, Bld: 113 mg/dL — ABNORMAL HIGH (ref 70–99)
Potassium: 3.3 mmol/L — ABNORMAL LOW (ref 3.5–5.1)
Sodium: 139 mmol/L (ref 135–145)
Total Bilirubin: 0.7 mg/dL (ref 0.3–1.2)
Total Protein: 7.1 g/dL (ref 6.0–8.3)

## 2014-12-10 LAB — RAPID URINE DRUG SCREEN, HOSP PERFORMED
Amphetamines: NOT DETECTED
BARBITURATES: POSITIVE — AB
Benzodiazepines: POSITIVE — AB
Cocaine: NOT DETECTED
Opiates: NOT DETECTED
Tetrahydrocannabinol: NOT DETECTED

## 2014-12-10 MED ORDER — ALUM & MAG HYDROXIDE-SIMETH 200-200-20 MG/5ML PO SUSP: 30.0000 mL | ORAL | Status: DC | PRN

## 2014-12-10 MED ORDER — LORAZEPAM 1 MG PO TABS
1.0000 mg | ORAL_TABLET | Freq: Three times a day (TID) | ORAL | Status: DC | PRN
  Administered 2014-12-11: 1 mg via ORAL
  Filled 2014-12-10 (×2): qty 1

## 2014-12-10 MED ORDER — NICOTINE 21 MG/24HR TD PT24
21.0000 mg | MEDICATED_PATCH | Freq: Every day | TRANSDERMAL | Status: DC
Start: 2014-12-10 — End: 2014-12-11

## 2014-12-10 MED ORDER — ACETAMINOPHEN 325 MG PO TABS
650.0000 mg | ORAL_TABLET | ORAL | Status: DC | PRN
  Administered 2014-12-11: 650 mg via ORAL
  Filled 2014-12-10: qty 2

## 2014-12-10 MED ORDER — IBUPROFEN 200 MG PO TABS: 600.0000 mg | ORAL_TABLET | Freq: Three times a day (TID) | ORAL | Status: DC | PRN

## 2014-12-10 MED ORDER — ZOLPIDEM TARTRATE 5 MG PO TABS
5.0000 mg | ORAL_TABLET | Freq: Every evening | ORAL | Status: DC | PRN
  Administered 2014-12-10: 5 mg via ORAL
  Filled 2014-12-10: qty 1

## 2014-12-10 MED ORDER — ONDANSETRON HCL 4 MG PO TABS: 4.0000 mg | ORAL_TABLET | Freq: Three times a day (TID) | ORAL | Status: DC | PRN
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
## 2014-12-10 NOTE — ED Notes (Signed)
Pt. and belongings wanded by security 
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
## 2014-12-10 NOTE — ED Notes (Addendum)
Pt observed talking to himself in his room and becoming more agitated.
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
## 2014-12-10 NOTE — Telephone Encounter (Signed)
Will see today.  

## 2014-12-10 NOTE — Telephone Encounter (Signed)
Patient Name: Rodney Branch DOB: Apr 15, 1931 Nurse Assessment Nurse: Ronnald Ramp, RN, Miranda Date/Time (Eastern Time): 12/10/2014 8:29:24 AM Confirm and document reason for call. If symptomatic, describe symptoms. ---Caller states her husband is pacing and acting confused this morning. He has been having confusion, saying/writing things that do not make sense, and doing things that are abnormal since before Christmas. He was seen in ED last week. PCP is aware of what is going on and has made a referral to psychiatrist but the doctor that was recommended is not in his network. Has the patient traveled out of the country within the last 30 days? ---Not Applicable Does the patient require triage? ---Yes Related visit to physician within the last 2 weeks? ---No Does the PT have any chronic conditions? (i.e. diabetes, asthma, etc.) ---Yes List chronic conditions. ---depression, Insomnia Guidelines Guideline Title Affirmed Question Affirmed Notes Confusion - Delirium [1] Longstanding confusion (e.g., dementia, stroke) AND [2] worsening Final Disposition User See Physician within 4 Hours (or PCP triage) Ronnald Ramp, RN, [employer]

## 2014-12-10 NOTE — ED Notes (Signed)
Pt given a meal, but currently refusing to eat.

## 2014-12-10 NOTE — BH Assessment (Signed)
Notified [facility],[place] of pt consult.  Shaune Pollack, MS, [place] Assessment Counselor
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
## 2014-12-10 NOTE — Telephone Encounter (Signed)
Pt has appt 12/10/14 at 10 AM with Dr Darnell Level.
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
## 2014-12-10 NOTE — ED Notes (Signed)
Bed: [facility] Expected date:  Expected time:  Means of arrival:  Comments: Triage 3
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
## 2014-12-10 NOTE — Progress Notes (Signed)
CSW attempted to meet with patient at beside. However, he was asleep.  Willette Brace [phone] ED CSW 12/10/2014 11:37 PM

## 2014-12-10 NOTE — ED Notes (Signed)
Wife: Mardene Celeste- [phone]  [name] local son: [name] (and wife [name])- [phone]  [name] son in [place]: [name]

## 2014-12-10 NOTE — ED Notes (Signed)
Bed: AI90 Expected date:  Expected time:  Means of arrival:  Comments: Hold C

## 2014-12-11 ENCOUNTER — Encounter: Payer: Self-pay | Admitting: Family Medicine

## 2014-12-11 DIAGNOSIS — F32A Depression, unspecified: Secondary | ICD-10-CM | POA: Insufficient documentation

## 2014-12-11 DIAGNOSIS — F329 Major depressive disorder, single episode, unspecified: Secondary | ICD-10-CM | POA: Insufficient documentation

## 2014-12-11 MED ORDER — PAROXETINE HCL 20 MG PO TABS
40.0000 mg | ORAL_TABLET | Freq: Every day | ORAL | Status: DC
Start: 2014-12-11 — End: 2014-12-11
  Administered 2014-12-11: 40 mg via ORAL
  Filled 2014-12-11: qty 2

## 2014-12-11 MED ORDER — DOXAZOSIN MESYLATE 4 MG PO TABS
4.0000 mg | ORAL_TABLET | Freq: Every day | ORAL | Status: DC
  Administered 2014-12-11: 4 mg via ORAL
  Filled 2014-12-11: qty 1

## 2014-12-11 NOTE — ED Notes (Signed)
Pt appears agitated, stating he has to go home, and "I don't have time for all of this".

## 2014-12-11 NOTE — ED Notes (Signed)
Assessment team at bedside. 

## 2014-12-11 NOTE — ED Notes (Signed)
[facility] Department called for transportation.

## 2014-12-11 NOTE — ED Provider Notes (Signed)
  Physical Exam  BP 133/76 mmHg  Pulse 88  Temp(Src) 97.4 F (36.3 C) (Oral)  Resp 16  SpO2 98%  Physical Exam  ED Course  Procedures  MDM Patient accepted at [facility] under Dr. Lorrene Reid. Stable for transfer    Wandra Arthurs, MD 12/11/14 1308

## 2014-12-11 NOTE — BH Assessment (Addendum)
[facility] [place] Assessment Progress Note  At 10:07 I received a call from Nunzio Cory at [facility].  Pt has been accepted to their facility by Tommye Standard, NP to the service of Dr Lorrene Reid, Rm [phone].  Please call report to [phone].  Nunzio Cory requests that IVC paperwork be faxed to her.  As of this writing First Examination is pending.  Once this is completed paperwork will be faxed and pt's nurse will be notified.  Jalene Mullet, MA Triage Specialist 12/11/2014 @ 11:03  Addendum: IVC paperwork, including First Examination, has been faxed to [facility].  [name] nurse, Claiborne Billings, has been notified.  She agrees to call report to the number noted above.  Pt is to be transported by [facility] Dept.  Jalene Mullet, MA Triage Specialist 12/11/2014 @ 12:54

## 2014-12-11 NOTE — ED Notes (Signed)
Pt attempted to call his wife and when she didn't answer he got very upset, started crying, talking that she must be dead and how something horrible must have happened to her. Per pt's request this RN called pt's son [name] who reports that he spoke with pt's wife just a few minutes ago and that he will have her call pt at number this RN provided. Pt was given Ativan as per PRN orders, however he is still very anxious and agitated, pacing the room. He refused his other meds at this time

## 2014-12-11 NOTE — ED Notes (Signed)
Spoke with pt spouse via telephone, aware the patient will be transferred at this time to [place]

## 2014-12-16 ENCOUNTER — Telehealth: Payer: Self-pay | Admitting: Family Medicine

## 2014-12-16 DIAGNOSIS — E876 Hypokalemia: Secondary | ICD-10-CM | POA: Insufficient documentation

## 2014-12-16 NOTE — Telephone Encounter (Signed)
Spoke with wife. Son [name] is still here. He has been seeing psychiatrist Dr Ronnald Ramp. Started on seroquel 100mg  nightly. Discharge home planned tomorrow. Has outpt f/u with outpatient psychiatrist tomorrow 4:30pm.

## 2014-12-16 NOTE — Telephone Encounter (Signed)
Spouse (patricia) called mr Sobczyk is being discharged tomorrow afternoon from [facility] in [place] She would like dr g to give her call.

## 2014-12-22 ENCOUNTER — Other Ambulatory Visit: Payer: Self-pay | Admitting: Family Medicine

## 2014-12-22 NOTE — Telephone Encounter (Signed)
Will review at f/u visit tomorrow.

## 2014-12-23 ENCOUNTER — Ambulatory Visit (INDEPENDENT_AMBULATORY_CARE_PROVIDER_SITE_OTHER): Payer: Medicare HMO | Admitting: Family Medicine

## 2014-12-23 ENCOUNTER — Encounter: Payer: Self-pay | Admitting: Family Medicine

## 2014-12-23 VITALS — BP 126/72 | HR 64 | Temp 97.9°F | Wt 132.5 lb

## 2014-12-23 DIAGNOSIS — G4700 Insomnia, unspecified: Secondary | ICD-10-CM

## 2014-12-23 DIAGNOSIS — G250 Essential tremor: Secondary | ICD-10-CM

## 2014-12-23 DIAGNOSIS — R251 Tremor, unspecified: Secondary | ICD-10-CM

## 2014-12-23 DIAGNOSIS — R413 Other amnesia: Secondary | ICD-10-CM

## 2014-12-23 DIAGNOSIS — F332 Major depressive disorder, recurrent severe without psychotic features: Secondary | ICD-10-CM

## 2014-12-23 DIAGNOSIS — G252 Other specified forms of tremor: Secondary | ICD-10-CM

## 2014-12-23 DIAGNOSIS — F411 Generalized anxiety disorder: Secondary | ICD-10-CM

## 2014-12-23 MED ORDER — DOXAZOSIN MESYLATE 4 MG PO TABS: 4.0000 mg | ORAL_TABLET | Freq: Every day | ORAL | Status: DC

## 2014-12-23 MED ORDER — ALPRAZOLAM 0.25 MG PO TABS: ORAL_TABLET | ORAL | Status: DC

## 2014-12-23 NOTE — Progress Notes (Signed)
Pre visit review using our clinic review tool, if applicable. No additional management support is needed unless otherwise documented below in the visit note. 

## 2014-12-23 NOTE — Assessment & Plan Note (Addendum)
Difficult to determine 2/2 recent psychiatric overtones but there seems to be a mild progressive decline in cognition over last several months culminating in recent acute decompensation.  Advised we will reassess status in 1 mo after he's again regular with antidepressant. Continue aricept for now, consider switch to rivastigmine.  MMSE: 24-27/30 (calculation 5/5 DLROW, 2/5 serial 7s) - pt very nervous during entire exam (11/2014)

## 2014-12-23 NOTE — Progress Notes (Signed)
BP 126/72 mmHg  Pulse 64  Temp(Src) 97.9 F (36.6 C) (Oral)  Wt 132 lb 8 oz (60.102 kg)   CC: hospital f/u visit  Subjective:    Patient ID: Rodney Branch, male    DOB: 1931/02/03, 81 y.o.   MRN: [id number]  HPI: Rodney Branch is a 81 y.o. male presenting on 12/23/2014 for Follow-up   See recent phone notes and office notes.   Briefly, seen here early this month with concern for uncompensated dementia with behavioral disturbances and agitation, started on aricept 5mg  daily. There was also concern for overusing ambien for insomnia. Became agitated to point of wife feeling unsafe and both she and son involuntarily committed patient 12/10/2014. Some delusionary thinking with paranoia expressed - felt he was passively dead "call funeral home to pick up dead body" and felt family was stealing things from him. Making questionable financial choices - liquidated annuity to give money to family and throwing away important financial and medical documents including social security card per wife. Spent 1/2 week at [facility], discharged after deemed safe. Never SI/HI.   ER records reviewed. Behavioral health records reviewed - dx with recurrent major depression, GAD, and mild cognitive impairment possibly mild dementia. Judgment/insight was noted to be impaired but he was felt to have capacity to consent to medical therapy. Pt remains upset over hospitalization at [facility]. Still struggling at home with trust in relationship with wife. She has been managing his medications since he's returned home (12/17/2014).   Pt initially voluntarily relinquished driver's license "to get along with wife and son", upset about this. Now has temporary driver's license to drive again. According to [facility] pt he was deemed ok to drive. [name] (son) has returned back home to [place] and other son Edd Arbour is back in [place]. Has one local son nearby.   In process of selling the house. Has  liquidated annuity to give money to family.  Persistent labile emotions. Persistent impaired judgement.   Pt has re established with Dr Lynder Parents and has first appointment 12/26/2014. Planning on starting going to support group in [place]. He may not have been taking paxil in the past.   Longstanding history of essential tremor. Previous ?LBD but not likely per neurology (seen ~2013). Mother with h/o alzheimer's.  Grandson with h/o OCD and bipolar.   Relevant past medical, surgical, family and social history reviewed and updated as indicated. Interim medical history since our last visit reviewed. Allergies and medications reviewed and updated. Current Outpatient Prescriptions on File Prior to Visit  Medication Sig  . donepezil (ARICEPT) 5 MG tablet Take 1 tablet (5 mg total) by mouth at bedtime.  [name] PARoxetine (PAXIL) 40 MG tablet TAKE 1 TABLET (40 MG TOTAL) BY MOUTH EVERY MORNING.  . primidone (MYSOLINE) 50 MG tablet TAKE 1 TABLET BY MOUTH TWICE A DAY  . zolpidem (AMBIEN) 5 MG tablet Take 1 tablet (5 mg total) by mouth at bedtime as needed for sleep.  [name] aspirin 81 MG tablet Take 81 mg by mouth daily.    . fexofenadine (ALLEGRA) 180 MG tablet Take 180 mg by mouth daily as needed.   . fluticasone (FLONASE) 50 MCG/ACT nasal spray Place 2 sprays into both nostrils daily as needed.   No current facility-administered medications on file prior to visit.   Past Medical History  Diagnosis Date  . Depression 1996    Psych Admit ([place]) 1996  . Hypertension 1996  . Benign prostatic hypertrophy  1996  . Tremor, essential   . Psychiatric hospitalization 12/1994    self referral/anxiety depression  . INSOMNIA, CHRONIC 05/20/2009  . Malignant melanoma 2015    R abd ([place])   Review of Systems Per HPI unless specifically indicated above     Objective:    BP 126/72 mmHg  Pulse 64  Temp(Src) 97.9 F (36.6 C) (Oral)  Wt 132 lb 8 oz (60.102 kg)  Wt Readings from Last 3  Encounters:  12/23/14 132 lb 8 oz (60.102 kg)  12/02/14 137 lb 8 oz (62.37 kg)  11/11/14 140 lb 8 oz (63.73 kg)    Physical Exam  Constitutional: He appears well-developed and well-nourished. No distress.  HENT:  Mouth/Throat: Oropharynx is clear and moist. No oropharyngeal exudate.  Eyes: Conjunctivae and EOM are normal. Pupils are equal, round, and reactive to light. No scleral icterus.  Neck: No thyromegaly present.  Cardiovascular: Normal rate, regular rhythm, normal heart sounds and intact distal pulses.   No murmur heard. Pulmonary/Chest: Effort normal and breath sounds normal. No respiratory distress. He has no wheezes. He has no rales.  Musculoskeletal: He exhibits no edema.  Lymphadenopathy:    He has no cervical adenopathy.  Skin: Skin is warm and dry.  Psychiatric: His mood appears anxious. His affect is labile. His speech is tangential. He is agitated.  Markedly tangential, fickle attention. Limited insight/judgement noted today.  Nursing note and vitals reviewed.     Assessment & Plan:  Reviewed med administration with patient and wife.  Problem List Items Addressed This Visit    TREMOR, ESSENTIAL    Continue primadone.      Memory deficit    Difficult to determine 2/2 recent psychiatric overtones but there seems to be a mild progressive decline in cognition over last several months culminating in recent acute decompensation.  Advised we will reassess status in 1 mo after he's again regular with antidepressant. Continue aricept for now, consider switch to rivastigmine.  MMSE: 24-27/30 (calculation 5/5 DLROW, 2/5 serial 7s) - pt very nervous during entire exam (11/2014)      MDD (major depressive disorder) - Primary    Patient may not have been taking paxil for unclear duration which may have led to recent episode. Now back on paxil 40mg  daily - wife is ensuring pt taking meds as prescribed. Advised we will reassess status in 4 wks, pt will return at that time for  f/u. He has appt next week with psychiatrist to re establish care. Will fax today's note attn Dr Clovis Pu in [place].       Relevant Medications   ALPRAZolam  (XANAX) tablet   INSOMNIA, CHRONIC    Reviewed ambien usage, advised to only take 5mg  nightly - which is a decrease from prior 10mg  dose. Advised if any concerns for misuse of med we will discontinue ambien.      Anxiety state    Severe. Xanax refilled to take bid prn.      Relevant Medications   ALPRAZolam  (XANAX) tablet       Follow up plan: Return in about 4 weeks (around 01/20/2015), or if symptoms worsen or fail to improve, for follow up visit.

## 2014-12-23 NOTE — Assessment & Plan Note (Signed)
Reviewed ambien usage, advised to only take 5mg  nightly - which is a decrease from prior 10mg  dose. Advised if any concerns for misuse of med we will discontinue ambien.

## 2014-12-23 NOTE — Assessment & Plan Note (Addendum)
Severe. Xanax refilled to take bid prn.

## 2014-12-23 NOTE — Assessment & Plan Note (Signed)
Patient may not have been taking paxil for unclear duration which may have led to recent episode. Now back on paxil 40mg  daily - wife is ensuring pt taking meds as prescribed. Advised we will reassess status in 4 wks, pt will return at that time for f/u. He has appt next week with psychiatrist to re establish care. Will fax today's note attn Dr Clovis Pu in [place].

## 2014-12-23 NOTE — Assessment & Plan Note (Signed)
Continue primadone.

## 2014-12-23 NOTE — Patient Instructions (Addendum)
Keep appointment with Dr Clovis Pu and group sessions.  Return to see me in 1 month for follow up. We are decreasing ambien to 5mg  nightly (zolpidem).  I've refilled 90 days of doxazosin 4mg  daily.  Return to see me in 1 month for follow up.

## 2014-12-24 ENCOUNTER — Telehealth: Payer: Self-pay | Admitting: *Deleted

## 2014-12-24 NOTE — Telephone Encounter (Signed)
PA for Zolpidem in your IN box for completion.

## 2014-12-25 NOTE — Telephone Encounter (Signed)
Filled and in [name]'s box. 

## 2014-12-25 NOTE — Telephone Encounter (Signed)
Form faxed. Will await determination. 

## 2014-12-26 ENCOUNTER — Telehealth: Payer: Self-pay | Admitting: *Deleted

## 2014-12-26 NOTE — Telephone Encounter (Signed)
Patient's wife says the Psych MD prescribed Olanzapine 5 mg 2 hours before bedtime.  Medication was too expensive and patient refused.  Patient was advised by Psych to D/C Zolpidem and the anxiety pill.  Patient does not want to discontinue the anxiety pill, partly because they just had it filled and it's paid for.  However, they apparently had 2 different strengths and was not aware of any change.  I am assuming they are speaking of Xanax. They have 0.25 and 0.5 mg.  Please advise which is appropriate and patient's wife is asking if he can have something else for sleep that doesn't affect his memory loss.

## 2014-12-26 NOTE — Telephone Encounter (Signed)
PA approved and pharmacy notified. 

## 2014-12-29 NOTE — Telephone Encounter (Signed)
We have been prescribing xanax 0.25mg .  Would retry benadryl 25mg  nightly for sleep.

## 2014-12-29 NOTE — Telephone Encounter (Signed)
Left message on machine returning patient's call 

## 2014-12-30 NOTE — Telephone Encounter (Signed)
Patient called extremely upset that there wasn't a prescription ready for him yet. I advised him that psych wanted him to stop the [place] and he became irate on the phone demanding that you write him a script for it now for him to come pick up. He doesn't want his wife aware of anything "because she has alzheimer's" and you "know that he didn't need to go to see psych". I asked if he had tried benadryl for a sleep aid and he said no and said he wasn't going to either. He was only going to take ambien 10mg  and demanded once again that you write the script for him. I tried to reason with him about psych wanting him to stop it and he wouldn't hear anything I was saying. He just kept getting more and more irate.

## 2014-12-30 NOTE — Telephone Encounter (Signed)
Spoke with patient and with wife. I will not prescribe 10mg  ambien.  Will prescribe 5mg  ambien and we will phone in prescription when next due.  Pt very apologetic about becoming irate with kim.

## 2014-12-30 NOTE — Telephone Encounter (Signed)
Pt states that his wife is tearing up his prescriptions and pt request new rx for zolpidem 10 mg. Spoke with Vicente Males at [facility] and pt last got zolpidem 5 mg # 30 on 12/05/14. Pt understands he is early but wants printed rx for zolpidem 10 mg with whatever date Dr Darnell Level wants to put on rx. Pt does not want his wife to know about this prescription. Pt does not want a phone call. Pt will ck at [facility] later today for rx. Please advise.

## 2015-01-01 ENCOUNTER — Telehealth: Payer: Self-pay | Admitting: Family Medicine

## 2015-01-01 MED ORDER — TRAZODONE HCL 50 MG PO TABS: 25.0000 mg | ORAL_TABLET | Freq: Every evening | ORAL | Status: DC | PRN

## 2015-01-01 NOTE — Telephone Encounter (Signed)
Pt walked in very agitated and upset about not being able to pick up wife's ambien prescription. He states he has run out of [place]. I was very clear that I don't want him filling wife's ambien, and that I only want him taking 5mg  ambien prescription, and he may fill his own dose when next due. Discussed concerns with misuse and abuse of controlled substances, discussed concerns with his memory. He perseverates that his wife has alzheimer's not him. Pt calms down. Refuses OTC sleeping aides. Agrees to trial of trazodone - will send to pharmacy. May consider seroquel if fails trazodone.

## 2015-01-02 NOTE — Telephone Encounter (Signed)
I spoke with pharmacist. She had not called [place] - that was a late entry. See last few phone notes.

## 2015-01-02 NOTE — Telephone Encounter (Signed)
Vicente Males, pharmacist at [facility] called on 01/01/15 re: concerns over Ambien medication. Pt is trying to get wife's Ambien filled for his use. Please call Vicente Males at [phone] / lt

## 2015-01-07 ENCOUNTER — Other Ambulatory Visit: Payer: Self-pay | Admitting: *Deleted

## 2015-01-07 ENCOUNTER — Telehealth: Payer: Self-pay | Admitting: *Deleted

## 2015-01-07 NOTE — Telephone Encounter (Signed)
noted 

## 2015-01-07 NOTE — Telephone Encounter (Signed)
Spoke with patient's wife and she wanted to let you know that psych has started him on olanzapine 5mg  and stopped the [place]. Med list updated.

## 2015-01-09 ENCOUNTER — Telehealth: Payer: Self-pay | Admitting: Family Medicine

## 2015-01-09 DIAGNOSIS — F333 Major depressive disorder, recurrent, severe with psychotic symptoms: Secondary | ICD-10-CM

## 2015-01-09 NOTE — Telephone Encounter (Signed)
Mrs Sittner left v/m; Mrs Barretta request letter for bank that pt has dementia and is unable to talk on telephone so Mrs Hoben can get social security check direct deposited into new bank acct that Mrs Shankman has opened. Bank will not accept POA. Mrs Tolson said that when she calls people on phone and hands phone to pt, pt hangs up phone. Mrs Mullett request letter by 01/12/15. Mrs Bencomo request cb.

## 2015-01-09 NOTE — Telephone Encounter (Addendum)
Spoke with patient's wife and advised her to call Dr. Marella Bile office. She said she already did and actually cancelled his appt. She said that he stole her Lorrin Mais and switched it out for a [facility] generic arthritis med. He put the arthritis medicine in her ambien bottle and then put her Lorrin Mais in his arthritis bottle. She found this out because she was looking for the pill cutter because it wasn't in it's usual location and found it in the bathroom, where it is not usually kept. He had cut all of the arthritis pills to make them look like her Lorrin Mais so she wouldn't know the difference. She was asking if there is some way to put a flag on him, so that if he does succeed in getting an [place] script from another provider, that the flag would appear to not allow it to be filled.  She also has been trying to take care of things with their social security direct deposit. She opened a new account for their checks and the bank said they will need a letter from you stating that he has dementia and cannot coherently talk with them in order for her to have his check deposited into the account. They cannot accept her POA and she has tried to have him talk to them and he just hangs up the phone. She wants to pick it up 01/12/15.

## 2015-01-09 NOTE — Telephone Encounter (Signed)
Rodney Branch called wanting Rodney g to call her Rodney Branch wanted Rodney Branch stop Rodney Branch he prescribed him another med. Rodney Branch went to  to make an appointment with Rodney Branch to see if could get a rx for Rodney Branch She wanted if there was a way to alert other Rodney not to prescribe ambien to Rodney Branch

## 2015-01-09 NOTE — Telephone Encounter (Signed)
Patient's wife returned [name]'s call.  Patient's wife said she doesn't need the letter. The Social Security is taken care of.

## 2015-01-09 NOTE — Telephone Encounter (Signed)
Letter from Dr. Clovis Pu in your IN box.

## 2015-01-09 NOTE — Telephone Encounter (Addendum)
Reviewed note from Dr Clovis Pu. He recommends stopping ambien and xanax. Note also mentioned he was doing much better on olazapine 5mg  nightly. How is he doing on this med? I stopped ambien last week. Would suggest she could call Dr Laurelyn Sickle office to advise not to prescribe ambien after we and psychiatrist have recommended stopped this.

## 2015-01-10 ENCOUNTER — Encounter: Payer: Self-pay | Admitting: Family Medicine

## 2015-01-10 NOTE — Telephone Encounter (Signed)
Noted. See prior phone note. 

## 2015-01-10 NOTE — Telephone Encounter (Signed)
See other phone note - wife stated social security issue was resolved and she didn't need letter. I don't know of a way to place a flag on him other than to notify his [facility] not to fill ambien. Best thing may be for [place] to stop [place] for wife as well and could try different sleeping aid.

## 2015-01-12 ENCOUNTER — Telehealth: Payer: Self-pay

## 2015-01-12 NOTE — Telephone Encounter (Signed)
Spoke with patient's wife and notified her of your suggestions. She said she had called Dr. Casimiro Needle office to see about increasing his olazapine to 10mg  and go from there. If that doesn't work, she will let you know and possibly change her meds.

## 2015-01-12 NOTE — Telephone Encounter (Signed)
Mrs Dobie wants to change pharmacies from [place] to [place]; advised if refills available at [facility]; [facility] can call to have refills transferred. Mrs Opdahl will speak with [facility].

## 2015-01-13 ENCOUNTER — Telehealth: Payer: Self-pay | Admitting: Family Medicine

## 2015-01-13 ENCOUNTER — Telehealth: Payer: Self-pay | Admitting: *Deleted

## 2015-01-13 NOTE — Telephone Encounter (Signed)
Spoke w/ wife. He is taking olanzapine nightly. Recently increased per psych. Has been going to other doctor's offices to try and get 10mg  Lorrin Mais but has been unsuccessful as far as wife knows. Will call tomorrow to try and see Dr Clovis Pu sooner than prior scheduled appt.

## 2015-01-13 NOTE — Telephone Encounter (Signed)
There is no standard form that we have for this. Contacted [employer]. They are mailing patient the form and she will have to bring it here for [place] to fill out. Mrs. Abadi notified and verbalized understanding. She will await the arrival of the form.

## 2015-01-13 NOTE — Telephone Encounter (Signed)
Patient's wife called in, [place] keeps dialing 411 on phone and wife is charged.  Patient's  wife needs form filled out by Dr Danise Mina so that patient will not be charged for 411 calls.  The phone company says that the Dr will know which form he needs to have filled out. It is a form that the state pays for charges for "people of this condition"  Patient's wife can pick up or we can mail directly to [address], [address], [place] [address] Patient's wife would like call returned when this is taken care of [phone]. Thanks

## 2015-01-13 NOTE — Telephone Encounter (Signed)
Patient came in without an appt demanding to see Dr. Darnell Level because he wanted a refill on his [place]. Advised patient that he would have to wait because Dr. Darnell Level had patients on his schedule that would need to be seen and that Dr. Darnell Level would get to him as soon as he could. Pt verbalized understanding and was placed in a room. He walked out prior to being seen by Dr. Darnell Level. He didn't tell me he was leaving or notify the front office. When I called his house, his wife said he just arrived home and told her that he had been here and wasn't waiting any longer and that he would go to every doctor he had to in order to get his [place]. She is ready to put him in a facility and said she may need your help in doing so. She is at her wits end trying to keep up with him and handle things.

## 2015-01-19 ENCOUNTER — Telehealth: Payer: Self-pay | Admitting: Family Medicine

## 2015-01-19 NOTE — Telephone Encounter (Signed)
[name] wife came in to speak with Maudie Mercury.  Pt is getting worse, he is spending money, he is wanting to buy a car and a boat, he is opening a storage unit, and other things.  Pts wife wanting a [employer], [employer]/film/video.  The best number to call pt is [phone]. Please call asap, wife is wanting to go to [employer].

## 2015-01-20 ENCOUNTER — Telehealth: Payer: Self-pay | Admitting: *Deleted

## 2015-01-20 NOTE — Telephone Encounter (Signed)
Maudie Mercury was able to get through to their office this morning around 9:15am, we were told Dr Clovis Pu would be calling back.

## 2015-01-20 NOTE — Telephone Encounter (Signed)
Called patient to schedule appt today at the request of Dr. Darnell Level. When I spoke with patient, he said he had an appt with Dr. Clovis Pu at 11:30 and didn't think he could get here by the 1230/1245 timeframe that Dr. Darnell Level was offering. Dr. Darnell Level notified and said he would try to touch base with Dr. Clovis Pu prior to patient's appt with him and I asked patient to call me with an update after his appt.

## 2015-01-20 NOTE — Telephone Encounter (Signed)
Spoke with Dr Clovis Pu who is seeing patient currently in office. Discussed concerns.  Likely persistent manic episode.

## 2015-01-20 NOTE — Telephone Encounter (Signed)
Tried to call, office doesn't open until 9am.

## 2015-01-20 NOTE — Telephone Encounter (Signed)
See other phone note

## 2015-01-21 NOTE — Telephone Encounter (Signed)
Dr. Clovis Pu increased olanzapine to 15 mg. She hasn't noticed any change as of right now. Dr. Clovis Pu advised that Rodney Branch get away from him for a bit and she said that wouldn't work because neither had anywhere to go due their animals. Then Dr. Clovis Pu suggested that Rodney Branch get something from you to relax her, which made her angry because she doesn't feel that she should have to take a medication for his problem. She asked Dr. Clovis Pu to write an involuntary commitment paper and he refused stating that he was of sound body and mind so he couldn't write one. So, she has basically decided to leave him because she just can't take it anymore.

## 2015-01-21 NOTE — Telephone Encounter (Signed)
Can we call wife today for an update after pt saw Dr Clovis Pu yesterday? I think olanzapine was going to be increased

## 2015-01-26 ENCOUNTER — Other Ambulatory Visit: Payer: Self-pay | Admitting: Family Medicine

## 2015-01-26 MED ORDER — IPRATROPIUM BROMIDE 0.03 % NA SOLN: 2.0000 | Freq: Two times a day (BID) | NASAL | Status: DC

## 2015-01-26 NOTE — Telephone Encounter (Signed)
Sent in

## 2015-01-26 NOTE — Telephone Encounter (Signed)
Patient requests Rx for Atrovent nose spray. Not on current or dc'd med list. Please advise.

## 2015-01-27 ENCOUNTER — Other Ambulatory Visit: Payer: Self-pay | Admitting: *Deleted

## 2015-01-27 MED ORDER — ALPRAZOLAM 0.25 MG PO TABS: ORAL_TABLET | ORAL | Status: DC

## 2015-01-27 NOTE — Telephone Encounter (Signed)
[place] with [facility] left v/m requesting cb about alprazolam refill.

## 2015-01-27 NOTE — Telephone Encounter (Signed)
plz phone in. 

## 2015-01-27 NOTE — Telephone Encounter (Signed)
Last office visit 12/23/2014.  Last refilled 12/23/2014 for #60 with no refills.  Ok to refill?

## 2015-01-27 NOTE — Telephone Encounter (Signed)
Called to [facility]. 

## 2015-02-10 ENCOUNTER — Ambulatory Visit (INDEPENDENT_AMBULATORY_CARE_PROVIDER_SITE_OTHER): Payer: Medicare HMO | Admitting: Family Medicine

## 2015-02-10 ENCOUNTER — Encounter: Payer: Self-pay | Admitting: Family Medicine

## 2015-02-10 VITALS — BP 128/66 | HR 88 | Temp 98.3°F | Wt 145.1 lb

## 2015-02-10 DIAGNOSIS — M25562 Pain in left knee: Secondary | ICD-10-CM

## 2015-02-10 DIAGNOSIS — G252 Other specified forms of tremor: Secondary | ICD-10-CM

## 2015-02-10 DIAGNOSIS — F39 Unspecified mood [affective] disorder: Secondary | ICD-10-CM

## 2015-02-10 DIAGNOSIS — M25561 Pain in right knee: Secondary | ICD-10-CM

## 2015-02-10 DIAGNOSIS — R251 Tremor, unspecified: Secondary | ICD-10-CM

## 2015-02-10 DIAGNOSIS — R413 Other amnesia: Secondary | ICD-10-CM

## 2015-02-10 DIAGNOSIS — G250 Essential tremor: Secondary | ICD-10-CM

## 2015-02-10 MED ORDER — PRIMIDONE 50 MG PO TABS: 50.0000 mg | ORAL_TABLET | Freq: Three times a day (TID) | ORAL | Status: DC

## 2015-02-10 NOTE — Assessment & Plan Note (Addendum)
Presumed from worsening osteoarthritis. Has seen ortho in past. Does not want to currently return. Discussed prn tylenol use. Handicap placard filled out today.

## 2015-02-10 NOTE — Progress Notes (Signed)
Pre visit review using our clinic review tool, if applicable. No additional management support is needed unless otherwise documented below in the visit note. 

## 2015-02-10 NOTE — Assessment & Plan Note (Signed)
Deteriorated. Will trial higher dose of primidone to 50mg  tid.

## 2015-02-10 NOTE — Assessment & Plan Note (Signed)
I do feel patient has developed bipolar disorder with manic episodes but still displays limited insight. Antidepressant has been decreased, while olanzapine has been increased to 15mg  nightly. Now off ambien but continues alprazolam 0.25mg  bid. Overall stable. No changes recommended today.

## 2015-02-10 NOTE — Patient Instructions (Addendum)
Handicap placard provided today. Memory testing done today - better than previously. Continue current medications for now. Good to see you today. Keep appointment in May. Let's try higher primodone dose - sent to pharmacy.

## 2015-02-10 NOTE — Assessment & Plan Note (Signed)
Improved but remaining impaired - recommend continue aricept 5mg  for now.
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
## 2015-04-16 ENCOUNTER — Other Ambulatory Visit: Payer: Self-pay | Admitting: Family Medicine

## 2015-04-16 DIAGNOSIS — E7800 Pure hypercholesterolemia, unspecified: Secondary | ICD-10-CM

## 2015-04-17 ENCOUNTER — Other Ambulatory Visit (INDEPENDENT_AMBULATORY_CARE_PROVIDER_SITE_OTHER): Payer: PPO

## 2015-04-17 DIAGNOSIS — E7800 Pure hypercholesterolemia, unspecified: Secondary | ICD-10-CM

## 2015-04-17 LAB — BASIC METABOLIC PANEL
BUN: 24 mg/dL — ABNORMAL HIGH (ref 6–23)
CO2: 25 mEq/L (ref 19–32)
Calcium: 9.4 mg/dL (ref 8.4–10.5)
Chloride: 108 mEq/L (ref 96–112)
Creatinine, Ser: 0.9 mg/dL (ref 0.40–1.50)
GFR: 85.87 mL/min (ref >60.00)
Glucose, Bld: 93 mg/dL (ref 70–99)
Potassium: 3.9 mEq/L (ref 3.5–5.1)
SODIUM: 140 meq/L (ref 135–145)

## 2015-04-17 LAB — LIPID PANEL
CHOL/HDL RATIO: 3
Cholesterol: 175 mg/dL (ref 0–200)
HDL: 50.4 mg/dL (ref >39.00)
LDL Cholesterol: 106 mg/dL — ABNORMAL HIGH (ref 0–99)
NONHDL: 124.6
Triglycerides: 95 mg/dL (ref 0.0–149.0)
VLDL: 19 mg/dL (ref 0.0–40.0)

## 2015-04-17 LAB — TSH: TSH: 1.32 u[IU]/mL (ref 0.35–4.50)

## 2015-04-21 ENCOUNTER — Encounter: Payer: Medicare HMO | Admitting: Family Medicine

## 2015-04-21 ENCOUNTER — Encounter: Payer: Self-pay | Admitting: Emergency Medicine

## 2015-04-21 ENCOUNTER — Emergency Department
Admission: EM | Admit: 2015-04-21 | Discharge: 2015-04-21 | Disposition: A | Discharge status: Home / Self Care | Payer: PPO | Attending: Emergency Medicine | Admitting: Emergency Medicine

## 2015-04-21 DIAGNOSIS — Z79899 Other long term (current) drug therapy: Secondary | ICD-10-CM | POA: Diagnosis not present

## 2015-04-21 DIAGNOSIS — I10 Essential (primary) hypertension: Secondary | ICD-10-CM | POA: Diagnosis not present

## 2015-04-21 DIAGNOSIS — K5641 Fecal impaction: Secondary | ICD-10-CM

## 2015-04-21 DIAGNOSIS — R197 Diarrhea, unspecified: Secondary | ICD-10-CM | POA: Diagnosis present

## 2015-04-21 MED ORDER — POLYETHYLENE GLYCOL 3350 17 G PO PACK: 17.0000 g | PACK | Freq: Every day | ORAL | Status: DC

## 2015-04-21 NOTE — ED Notes (Signed)
MD at bedside. 

## 2015-04-21 NOTE — Discharge Instructions (Signed)
Fecal Impaction °A fecal impaction happens when there is a large, firm amount of stool (or feces) that cannot be passed. The impacted stool is usually in the rectum, which is the lowest part of the large bowel. The impacted stool can block the colon and cause significant problems. °CAUSES  °The longer stool stays in the rectum, the harder it gets. Anything that slows down your bowel movements can lead to fecal impaction, such as: °· Constipation. This can be a long-standing (chronic) problem or can happen suddenly (acute). °· Painful conditions of the rectum, such as hemorrhoids or anal fissures. The pain of these conditions can make you try to avoid having bowel movements. °· Narcotic pain-relieving medicines, such as methadone, morphine, or codeine. °· Not drinking enough fluids. °· Inactivity and bed rest over long periods of time. °· Diseases of the brain or nervous system that damage the nerves controlling the muscles of the intestines. °SIGNS AND SYMPTOMS  °· Lack of normal bowel movements or changes in bowel patterns. °· Sense of fullness in the rectum but unable to pass stool. °· Pain or cramps in the abdominal area (often after meals). °· Thin, watery discharge from the rectum. °DIAGNOSIS  °Your health care provider may suspect that you have a fecal impaction based on your symptoms and a physical exam. This will include an exam of your rectum. Sometimes X-rays or lab testing may be needed to confirm the diagnosis and to be sure there are no other problems.  °TREATMENT  °· Initially an impaction can be removed manually. Using a gloved finger, your health care provider can remove hard stool from your rectum. °· Medicine is sometimes needed. A suppository or enema can be given in the rectum to soften the stool, which can stimulate a bowel movement. Medicines can also be given by mouth (orally). °· Though rare, surgery may be needed if the colon has torn (perforated) due to blockage. °HOME CARE INSTRUCTIONS   °· Develop regular bowel habits. This could include getting in the habit of having a bowel movement after your morning cup of coffee or after eating. Be sure to allow yourself enough time on the toilet. °· Maintain a high-fiber diet. °· Drink enough fluids to keep your urine clear or pale yellow as directed by your health care provider. °· Exercise regularly. °· If you begin to get constipated, increase the amount of fiber in your diet. Eat plenty of fruits, vegetables, whole wheat breads, bran, oatmeal, and similar products. °· Take natural fiber laxatives or other laxatives only as directed by your health care provider. °SEEK MEDICAL CARE IF:  °· You have ongoing rectal pain. °· You require enemas or suppositories more than twice a week. °· You have rectal bleeding. °· You have continued problems, or you develop abdominal pain. °· You have thin, pencil-like stools. °SEEK IMMEDIATE MEDICAL CARE IF:  °You have black or tarry stools. °MAKE SURE YOU:  °· Understand these instructions. °· Will watch your condition. °· Will get help right away if you are not doing well or get worse. °Document Released: 08/06/2004 Document Revised: 09/04/2013 Document Reviewed: 05/21/2013 °ExitCare® Patient Information ©2015 [employer]. This information is not intended to replace advice given to you by your health care provider. Make sure you discuss any questions you have with your health care provider. ° °

## 2015-04-21 NOTE — ED Notes (Signed)
brought in via ems from home. States he had constipation last week  Then developed diarrhea this am
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
## 2015-04-23 ENCOUNTER — Telehealth: Payer: Self-pay | Admitting: Family Medicine

## 2015-04-23 NOTE — Telephone Encounter (Signed)
Pt is scheduled to be seen tomorrow.

## 2015-04-23 NOTE — Telephone Encounter (Signed)
Patient Name: Rodney Branch DOB: [birth year] Initial Comment Caller states, went to the ER 2 days ago for constipation issue, today he is bleeding from rectum. Nurse Assessment Nurse: Marcelline Deist, RN, Lynda Date/Time (Eastern Time): 04/23/2015 10:31:28 AM Confirm and document reason for call. If symptomatic, describe symptoms. ---Caller states the pt. went to the ER 2 days ago for constipation issue. Today, he is bleeding from rectum. Has the patient traveled out of the country within the last 30 days? ---Not Applicable Does the patient require triage? ---No Please document clinical information provided and list any resource used. ---Nurse unable to reach caller after 3 attempts. Guidelines Guideline Title Affirmed Question Affirmed Notes Final Disposition User Clinical Call [place], RN, Kermit Balo

## 2015-04-23 NOTE — Telephone Encounter (Signed)
plz triage - if significant bleeding or dizziness/faintness may need to return to ER for stabilization

## 2015-04-23 NOTE — Telephone Encounter (Signed)
ASE NOTE: All timestamps contained within this report are represented as [place] Standard Time. CONFIDENTIALTY NOTICE: This fax transmission is intended only for the addressee. It contains information that is legally privileged, confidential or otherwise protected from use or disclosure. If you are not the intended recipient, you are strictly prohibited from reviewing, disclosing, copying using or disseminating any of this information or taking any action in reliance on or regarding this information. If you have received this fax in error, please notify us immediately by telephone so that we can arrange for its return to [place]. Phone: [phone], Toll-Free: [phone], Fax: [phone] Page: 1 of 1 Call Id: [id number] [place] Patient Name: Rodney Branch DOB: February 24, 1931 Initial Comment Caller states he has bowel problems, started Mon, went to hospital and was prescribed laxative, having only small stools, blood when wiping, has appt tomorrow, wants to know if he should be seen sooner, changed phone number from previous call Nurse Assessment Nurse: Marcelline Deist, RN, Kermit Balo Date/Time ([place] Time): 04/23/2015 1:02:40 PM Confirm and document reason for call. If symptomatic, describe symptoms. ---Caller states he has bowel problems, started Mon. with brown water. Went to hospital and was prescribed laxative, having only small stools. Treated for impacted stool. Has noticed bright red blood when wiping, states it is a lot. He has an appt. tomorrow, wants to know if he should be seen sooner. No stool is coming out, but has gas. Changed phone number from previous call. Is on his way to [facility]. Has the patient traveled out of the country within the last 30 days? ---Not Applicable Does the patient require triage? ---Yes Related visit to physician within the last 2 weeks? ---Yes Does the PT have any  chronic conditions? (i.e. diabetes, asthma, etc.) ---Yes List chronic conditions. ---depression rx, high BP rx. for prostitis, sleeping rx, takes rx for tremors Guidelines Guideline Title Affirmed Question Affirmed Notes Rectal Bleeding Blood passed alone without any stool Final Disposition User See Physician within 4 Hours (or PCP triage) Marcelline Deist, RN, Lynda Comments Caller has driven to the [facility] office. Made aware that there is no availability on schedule for today, or at the [facility] location. He asked for nurse to schedule him appt. tomorrow sooner than the one he has already at 4 pm, which is a wellness visit. he plans to go in office now & talk to scheduling about this. he would rather not do the 4 pm appt. tomorrow. Nurse advised that pt. should be seen within a 3-4 hr. period at [facility] since no appts. were available at office. Pt. does not want to be seen at an UC. He feels this bleeding could be related to a hemorrhoid that was aggravated by the ER Dr. putting his finger in his rectum.

## 2015-04-24 ENCOUNTER — Encounter: Payer: Self-pay | Admitting: Family Medicine

## 2015-04-24 ENCOUNTER — Ambulatory Visit (INDEPENDENT_AMBULATORY_CARE_PROVIDER_SITE_OTHER): Payer: PPO | Admitting: Family Medicine

## 2015-04-24 ENCOUNTER — Encounter: Payer: PPO | Admitting: Family Medicine

## 2015-04-24 VITALS — BP 144/64 | HR 89 | Temp 98.2°F | Wt 146.0 lb

## 2015-04-24 DIAGNOSIS — N401 Enlarged prostate with lower urinary tract symptoms: Secondary | ICD-10-CM | POA: Diagnosis not present

## 2015-04-24 DIAGNOSIS — K5641 Fecal impaction: Secondary | ICD-10-CM | POA: Diagnosis not present

## 2015-04-24 DIAGNOSIS — N138 Other obstructive and reflux uropathy: Secondary | ICD-10-CM

## 2015-04-24 DIAGNOSIS — K625 Hemorrhage of anus and rectum: Secondary | ICD-10-CM | POA: Insufficient documentation

## 2015-04-24 DIAGNOSIS — F39 Unspecified mood [affective] disorder: Secondary | ICD-10-CM | POA: Diagnosis not present

## 2015-04-24 LAB — CBC WITH DIFFERENTIAL/PLATELET
BASOS PCT: 0.5 % (ref 0.0–3.0)
Basophils Absolute: 0 10*3/uL (ref 0.0–0.1)
EOS ABS: 0.1 10*3/uL (ref 0.0–0.7)
EOS PCT: 1.3 % (ref 0.0–5.0)
HEMATOCRIT: 38.6 % — AB (ref 39.0–52.0)
HEMOGLOBIN: 12.8 g/dL — AB (ref 13.0–17.0)
Lymphocytes Relative: 21.7 % (ref 12.0–46.0)
Lymphs Abs: 0.9 10*3/uL (ref 0.7–4.0)
MCHC: 33.3 g/dL (ref 30.0–36.0)
MCV: 90.7 fl (ref 78.0–100.0)
MONO ABS: 0.5 10*3/uL (ref 0.1–1.0)
MONOS PCT: 11.7 % (ref 3.0–12.0)
NEUTROS PCT: 64.8 % (ref 43.0–77.0)
Neutro Abs: 2.8 10*3/uL (ref 1.4–7.7)
Platelets: 306 10*3/uL (ref 150.0–400.0)
RBC: 4.26 Mil/uL (ref 4.22–5.81)
RDW: 14 % (ref 11.5–15.5)
WBC: 4.3 10*3/uL (ref 4.0–10.5)

## 2015-04-24 MED ORDER — PAROXETINE HCL 40 MG PO TABS: ORAL_TABLET | ORAL | Status: DC

## 2015-04-24 MED ORDER — POLYETHYLENE GLYCOL 3350 17 GM/SCOOP PO POWD: 8.5000 g | Freq: Every day | ORAL | Status: DC | PRN

## 2015-04-24 MED ORDER — QUETIAPINE FUMARATE 300 MG PO TABS: 600.0000 mg | ORAL_TABLET | Freq: Every day | ORAL | Status: DC

## 2015-04-24 MED ORDER — POLYETHYLENE GLYCOL 3350 17 GM/SCOOP PO POWD: 0.5000 | Freq: Every day | ORAL | Status: DC | PRN

## 2015-04-24 MED ORDER — DOXAZOSIN MESYLATE 4 MG PO TABS: 4.0000 mg | ORAL_TABLET | Freq: Every day | ORAL | Status: DC

## 2015-04-24 NOTE — Assessment & Plan Note (Addendum)
Rectal bleed after recent fecal disimpaction - anticipate related to irritation of rectal wall after manual disimpaction vs irritation of internal hemorrhoids. Hemoccult + today. However pt notices daily improving. Check CBC, reassured on anticipate resolution of sxs. Discussed red flags to seek care over weekend. Update [place] if recurrent or worsening rectal bleed. Pt agrees with plan.

## 2015-04-24 NOTE — Progress Notes (Signed)
Pre visit review using our clinic review tool, if applicable. No additional management support is needed unless otherwise documented below in the visit note. 

## 2015-04-24 NOTE — Assessment & Plan Note (Signed)
Will refill prozac and seroquel for patient.

## 2015-04-24 NOTE — Progress Notes (Signed)
BP 144/64 mmHg  Pulse 89  Temp(Src) 98.2 F (36.8 C) (Oral)  Wt 146 lb (66.225 kg)  SpO2 97%   CC: hosp f/u visit, rectal bleeding  Subjective:    Patient ID: Rodney Branch, male    DOB: 01-03-1931, 81 y.o.   MRN: [id number]  HPI: Rodney Branch is a 81 y.o. male presenting on 04/24/2015 for Hospitalization Follow-up; Rectal Bleeding; and Hemorrhoids   Recent ER visit for fecal impaction with watery diarrhea s/p disimpaction. Sent home with miralax. Records reviewed.  sxs started Monday evening - watery diarrhea, straining without formed stool despite urge. Stool leakage - accidents in bed. Taken Tuesday morning via EMS to ER. Next few days BRBPR in commode and with wiping, minimal stool. This morning had 2 small formed stools, with very little blood. Denies dizziness or lightheadedness, chest pain  Requests I start refilling doxazosin 4mg  daily (Wrenn last saw 01/2015), paroxetine 40mg  daily (Dr Clovis Pu) and seroquel 300mg  2 nightly (Dr Clovis Pu). Requests paper scripts.  Colonoscopy (?200) at [facility] normal per patient, no records available.   Not on NSAIDs or aspirin.  Relevant past medical, surgical, family and social history reviewed and updated as indicated. Interim medical history since our last visit reviewed. Allergies and medications reviewed and updated. Current Outpatient Prescriptions on File Prior to Visit  Medication Sig  . fexofenadine (ALLEGRA) 180 MG tablet Take 180 mg by mouth daily as needed.   . primidone (MYSOLINE) 50 MG tablet Take 1 tablet (50 mg total) by mouth 3 (three) times daily.   No current facility-administered medications on file prior to visit.    Review of Systems Per HPI unless specifically indicated above     Objective:    BP 144/64 mmHg  Pulse 89  Temp(Src) 98.2 F (36.8 C) (Oral)  Wt 146 lb (66.225 kg)  SpO2 97%  Wt Readings from Last 3 Encounters:  04/24/15 146 lb (66.225 kg)  04/21/15 145 lb (65.772 kg)  02/10/15  145 lb 1.9 oz (65.826 kg)    Physical Exam  Constitutional: He appears well-developed and well-nourished. No distress.  HENT:  Mouth/Throat: Oropharynx is clear and moist. No oropharyngeal exudate.  Cardiovascular: Normal rate, regular rhythm, normal heart sounds and intact distal pulses.   No murmur heard. Abdominal: Soft. Normal appearance and bowel sounds are normal. He exhibits no distension and no mass. There is no tenderness. There is no rigidity, no rebound, no guarding, no CVA tenderness and negative Murphy's sign.  Genitourinary: Rectal exam shows no external hemorrhoid, no fissure, no mass, no tenderness and anal tone normal. Guaiac positive stool.  Hemoccult +  Musculoskeletal: He exhibits no edema.  Skin: Skin is warm and dry. No rash noted.  Nursing note and vitals reviewed.  Results for orders placed or performed in visit on 04/17/15  Lipid panel  Result Value Ref Range   Cholesterol 175 0 - 200 mg/dL   Triglycerides 95.0 0.0 - 149.0 mg/dL   HDL 50.40 >39.00 mg/dL   VLDL 19.0 0.0 - 40.0 mg/dL   LDL Cholesterol 106 (H) 0 - 99 mg/dL   Total CHOL/HDL Ratio 3    NonHDL 124.60   TSH  Result Value Ref Range   TSH 1.32 0.35 - 4.50 uIU/mL  Basic metabolic panel  Result Value Ref Range   Sodium 140 135 - 145 mEq/L   Potassium 3.9 3.5 - 5.1 mEq/L   Chloride 108 96 - 112 mEq/L   CO2 25 19 - 32  mEq/L   Glucose, Bld 93 70 - 99 mg/dL   BUN 24 (H) 6 - 23 mg/dL   Creatinine, Ser 0.90 0.40 - 1.50 mg/dL   Calcium 9.4 8.4 - 10.5 mg/dL   GFR 85.87 >60.00 mL/min      Assessment & Plan:   Problem List Items Addressed This Visit    BPH (benign prostatic hypertrophy) with urinary obstruction    Will continue doxazosin.      Fecal impaction    Finish miralax course then will start 1/2 capful miralax daily preventatively, hold for diarrhea.      Mood disorder    Will refill prozac and seroquel for patient.      Rectal bleeding - Primary    Rectal bleed after recent fecal  disimpaction - anticipate related to irritation of rectal wall after manual disimpaction vs irritation of internal hemorrhoids. Hemoccult + today. However pt notices daily improving. Check CBC, reassured on anticipate resolution of sxs. Discussed red flags to seek care over weekend. Update [place] if recurrent or worsening rectal bleed. Pt agrees with plan.      Relevant Orders   CBC with Differential/Platelet       Follow up plan: Return as needed, for follow up visit.

## 2015-04-24 NOTE — Assessment & Plan Note (Signed)
Will continue doxazosin.

## 2015-04-24 NOTE — Telephone Encounter (Signed)
Seen today. 

## 2015-04-24 NOTE — Patient Instructions (Addendum)
meds refilled today (printed). I think bleeding was from irritation. This should continue to heal daily. Continue miralax daily. After you finish 2 weeks start 1/2 capful daily (hold for diarrhea).  If dizziness or shortness of breath or chest pain, back to ER for evaluation.

## 2015-04-24 NOTE — Assessment & Plan Note (Addendum)
Finish miralax course then will start 1/2 capful miralax daily preventatively, hold for diarrhea.

## 2015-04-28 ENCOUNTER — Encounter: Payer: Self-pay | Admitting: *Deleted

## 2015-05-14 ENCOUNTER — Encounter: Payer: Self-pay | Admitting: Family Medicine

## 2015-05-14 ENCOUNTER — Ambulatory Visit (INDEPENDENT_AMBULATORY_CARE_PROVIDER_SITE_OTHER): Payer: PPO | Admitting: Family Medicine

## 2015-05-14 VITALS — BP 110/60 | HR 84 | Temp 97.8°F | Wt 147.5 lb

## 2015-05-14 DIAGNOSIS — K5901 Slow transit constipation: Secondary | ICD-10-CM | POA: Diagnosis not present

## 2015-05-14 DIAGNOSIS — K5900 Constipation, unspecified: Secondary | ICD-10-CM | POA: Insufficient documentation

## 2015-05-14 MED ORDER — POLYETHYLENE GLYCOL 3350 17 GM/SCOOP PO POWD: 17.0000 g | Freq: Every day | ORAL | Status: DC

## 2015-05-14 NOTE — Progress Notes (Signed)
   BP 110/60 mmHg  Pulse 84  Temp(Src) 97.8 F (36.6 C) (Oral)  Wt 147 lb 8 oz (66.906 kg)   CC: constipation  Subjective:    Patient ID: Rodney Branch, male    DOB: 05/12/1931, 81 y.o.   MRN: [id number]  HPI: Rodney Branch is a 81 y.o. male presenting on 05/14/2015 for Constipation   See prior note for details. Seen here after ER eval for fecal impaction s/p manual disimpaction with resultant rectal bleed a few days afterwards. Sent home with miralax and advised to take 1/2 capful miralax daily for prevention. When he was on 17gm daily he was having good stools. Then he decreased to 8.5gm daily and bowels have slowed down. Drinks with glass of grape juice. He also started dulcolax.   No BM in last 4 days.   Appetite good, drinking significant fluid. No abd pain. Passing gas well. No rectal bleeding. Pt insists he feels great, just no BM.  Requests going back to 1 capful daily.  Relevant past medical, surgical, family and social history reviewed and updated as indicated. Interim medical history since our last visit reviewed. Allergies and medications reviewed and updated. Current Outpatient Prescriptions on File Prior to Visit  Medication Sig  . doxazosin (CARDURA) 4 MG tablet Take 1 tablet (4 mg total) by mouth daily.  . fexofenadine (ALLEGRA) 180 MG tablet Take 180 mg by mouth daily as needed.   [name] PARoxetine (PAXIL) 40 MG tablet TAKE 1 TABLET (40 MG TOTAL) BY MOUTH EVERY MORNING.  . primidone (MYSOLINE) 50 MG tablet Take 1 tablet (50 mg total) by mouth 3 (three) times daily.  . QUEtiapine (SEROQUEL) 300 MG tablet Take 2 tablets (600 mg total) by mouth at bedtime.   No current facility-administered medications on file prior to visit.    Review of Systems Per HPI unless specifically indicated above     Objective:    BP 110/60 mmHg  Pulse 84  Temp(Src) 97.8 F (36.6 C) (Oral)  Wt 147 lb 8 oz (66.906 kg)  Wt Readings from Last 3 Encounters:  05/14/15 147 lb 8 oz (66.906  kg)  04/24/15 146 lb (66.225 kg)  04/21/15 145 lb (65.772 kg)    Physical Exam  Constitutional: He appears well-developed and well-nourished. No distress.  HENT:  Mouth/Throat: Oropharynx is clear and moist. No oropharyngeal exudate.  Abdominal: Soft. Normal appearance and bowel sounds are normal. He exhibits no distension and no mass. There is no hepatosplenomegaly. There is no tenderness. There is no rigidity, no rebound, no guarding, no CVA tenderness and negative Murphy's sign.  Musculoskeletal: He exhibits no edema.  Skin: Skin is warm and dry.  Psychiatric: He has a normal mood and affect.  Nursing note and vitals reviewed.      Assessment & Plan:   Problem List Items Addressed This Visit    Constipation - Primary    Return of constipation in h/o fecal impaction. Will increase miralax to 17gm daily which was working well for patient. Reviewed sxs of obstruction with patient. Pt states he will call us immediately if any of these sxs develop.          Follow up plan: Return if symptoms worsen or fail to improve.

## 2015-05-14 NOTE — Patient Instructions (Addendum)
I agree - let's restart 17gm daily. With option to increase to two doses of 17gm if needed. New dose sent to pharmacy. Make sure you're staying well hydrated.  Continue eating normally. Watch for fever, abdominal pain, unable to pass gas, or nausea/vomiting.

## 2015-05-14 NOTE — Progress Notes (Signed)
Pre visit review using our clinic review tool, if applicable. No additional management support is needed unless otherwise documented below in the visit note. 

## 2015-05-14 NOTE — Assessment & Plan Note (Signed)
Return of constipation in h/o fecal impaction. Will increase miralax to 17gm daily which was working well for patient. Reviewed sxs of obstruction with patient. Pt states he will call us immediately if any of these sxs develop.

## 2015-07-29 ENCOUNTER — Other Ambulatory Visit: Payer: Self-pay | Admitting: Family Medicine

## 2015-07-29 NOTE — Telephone Encounter (Signed)
Ok to refill 

## 2015-08-14 ENCOUNTER — Ambulatory Visit (INDEPENDENT_AMBULATORY_CARE_PROVIDER_SITE_OTHER)
Admission: RE | Admit: 2015-08-14 | Discharge: 2015-08-14 | Disposition: A | Discharge status: Home / Self Care | Payer: PPO | Source: Ambulatory Visit | Attending: Family Medicine | Admitting: Family Medicine

## 2015-08-14 ENCOUNTER — Ambulatory Visit (INDEPENDENT_AMBULATORY_CARE_PROVIDER_SITE_OTHER): Payer: PPO | Admitting: Family Medicine

## 2015-08-14 ENCOUNTER — Ambulatory Visit
Admission: RE | Admit: 2015-08-14 | Discharge: 2015-08-14 | Disposition: A | Discharge status: Home / Self Care | Payer: PPO | Source: Ambulatory Visit | Attending: Family Medicine | Admitting: Family Medicine

## 2015-08-14 ENCOUNTER — Encounter: Payer: Self-pay | Admitting: Family Medicine

## 2015-08-14 VITALS — BP 112/64 | HR 84 | Temp 97.8°F | Wt 146.8 lb

## 2015-08-14 DIAGNOSIS — M25562 Pain in left knee: Secondary | ICD-10-CM | POA: Diagnosis not present

## 2015-08-14 DIAGNOSIS — M129 Arthropathy, unspecified: Secondary | ICD-10-CM

## 2015-08-14 DIAGNOSIS — M1710 Unilateral primary osteoarthritis, unspecified knee: Secondary | ICD-10-CM

## 2015-08-14 DIAGNOSIS — Z23 Encounter for immunization: Secondary | ICD-10-CM | POA: Diagnosis not present

## 2015-08-14 DIAGNOSIS — M25561 Pain in right knee: Secondary | ICD-10-CM

## 2015-08-14 NOTE — Progress Notes (Signed)
Pre visit review using our clinic review tool, if applicable. No additional management support is needed unless otherwise documented below in the visit note. 

## 2015-08-14 NOTE — Progress Notes (Signed)
BP 112/64 mmHg  Pulse 84  Temp(Src) 97.8 F (36.6 C) (Oral)  Wt 146 lb 12 oz (66.565 kg)   CC: knee pain  Subjective:    Patient ID: Rodney Branch, male    DOB: 31-Jan-1931, 81 y.o.   MRN: [id number]  HPI: Rodney Branch is a 81 y.o. male presenting on 08/14/2015 for Knee Pain   R>L knee pain, know osteoarthritis. Progressively worsening, noticing limited ability to flex R knee and push off with right knee. Worst pain with transitions. No significant pain with walking. No redness, warmth or swelling. He has been using tylenol arthritis for this without much help. No inciting trauma/injury.  Hesitant for steroid shot.  Supplements didn't help (glucosamine, vit D).   Enjoys bowling but needs to wear knee braces for this.   Friend takes gabapentin - pt asks about this medicine.   Relevant past medical, surgical, family and social history reviewed and updated as indicated. Interim medical history since our last visit reviewed. Allergies and medications reviewed and updated. Current Outpatient Prescriptions on File Prior to Visit  Medication Sig  . doxazosin (CARDURA) 4 MG tablet Take 1 tablet (4 mg total) by mouth daily.  . fexofenadine (ALLEGRA) 180 MG tablet Take 180 mg by mouth daily as needed.   [name] PARoxetine (PAXIL) 40 MG tablet TAKE 1 TABLET (40 MG TOTAL) BY MOUTH EVERY MORNING.  [name] polyethylene glycol powder (GLYCOLAX/MIRALAX) powder Take 17 g by mouth daily. Option for 2nd 17g dose if persistent constipation  . primidone (MYSOLINE) 50 MG tablet TAKE ONE TABLET BY MOUTH THREE TIMES DAILY  . QUEtiapine (SEROQUEL) 300 MG tablet Take 2 tablets (600 mg total) by mouth at bedtime.   No current facility-administered medications on file prior to visit.    Review of Systems Per HPI unless specifically indicated above     Objective:    BP 112/64 mmHg  Pulse 84  Temp(Src) 97.8 F (36.6 C) (Oral)  Wt 146 lb 12 oz (66.565 kg)  Wt Readings from Last 3 Encounters:  08/14/15 146 lb  12 oz (66.565 kg)  05/14/15 147 lb 8 oz (66.906 kg)  04/24/15 146 lb (66.225 kg)    Physical Exam  Constitutional: He appears well-developed and well-nourished. No distress.  Musculoskeletal: He exhibits no edema.  Bilateral knee exam: No deformity on inspection. Some discomfort with palpation of right knee lateral tibial tuberosity No effusion/swelling noted. FROM in flex/extension without crepitus. No popliteal fullness. Neg drawer test. Neg mcmurray test. No pain with valgus/varus stress. No PFgrind. No abnormal patellar mobility.   Nursing note and vitals reviewed.   Lab Results  Component Value Date   CREATININE 0.90 04/17/2015      Assessment & Plan:   Problem List Items Addressed This Visit    Bilateral knee pain - Primary    Anticipate worsening osteoarthritis. Given prolonged sxs, will check bilat weight-bearing knee xrays today to eval for arthritic burden.  Pt declines steroid injection. Discussed ice/heating pad and continued brace use.  Consider PT referral depending on xray results. rec start ibuprofen 400mg  bid with food x 5 days then PRN.  Pt agrees with plan.      Relevant Orders   DG Knee AP/LAT W/Sunrise Left   DG Knee AP/LAT W/Sunrise Right   Arthritis of knee    Other Visit Diagnoses    Need for influenza vaccination        Relevant Orders    Flu Vaccine QUAD 36+ mos PF IM (  Fluarix & Fluzone Quad PF)        Follow up plan: Return if symptoms worsen or fail to improve.

## 2015-08-14 NOTE — Patient Instructions (Addendum)
I do think this is arthritis. Weight bearing films of both knees today. Treat with ibuprofen 400mg  twice daily with food for 5 days then as needed (instead of tylenol).  We could consider physical therapy or steroid shot in the future. Continue bracing knee, elevate knee to rest, and ice or heating pad (whichever soothes better).   Osteoarthritis Osteoarthritis is a disease that causes soreness and inflammation of a joint. It occurs when the cartilage at the affected joint wears down. Cartilage acts as a cushion, covering the ends of bones where they meet to form a joint. Osteoarthritis is the most common form of arthritis. It often occurs in older people. The joints affected most often by this condition include those in the:  Ends of the fingers.  Thumbs.  Neck.  Lower back.  Knees.  Hips. CAUSES  Over time, the cartilage that covers the ends of bones begins to wear away. This causes bone to rub on bone, producing pain and stiffness in the affected joints.  RISK FACTORS Certain factors can increase your chances of having osteoarthritis, including:  Older age.  Excessive body weight.  Overuse of joints.  Previous joint injury. SIGNS AND SYMPTOMS   Pain, swelling, and stiffness in the joint.  Over time, the joint may lose its normal shape.  Small deposits of bone (osteophytes) may grow on the edges of the joint.  Bits of bone or cartilage can break off and float inside the joint space. This may cause more pain and damage. DIAGNOSIS  Your health care provider will do a physical exam and ask about your symptoms. Various tests may be ordered, such as:  X-rays of the affected joint.  An MRI scan.  Blood tests to rule out other types of arthritis.  Joint fluid tests. This involves using a needle to draw fluid from the joint and examining the fluid under a microscope. TREATMENT  Goals of treatment are to control pain and improve joint function. Treatment plans may  include:  A prescribed exercise program that allows for rest and joint relief.  A weight control plan.  Pain relief techniques, such as:  Properly applied heat and cold.  Electric pulses delivered to nerve endings under the skin (transcutaneous electrical nerve stimulation [TENS]).  Massage.  Certain nutritional supplements.  Medicines to control pain, such as:  Acetaminophen.  Nonsteroidal anti-inflammatory drugs (NSAIDs), such as naproxen.  Narcotic or central-acting agents, such as tramadol.  Corticosteroids. These can be given orally or as an injection.  Surgery to reposition the bones and relieve pain (osteotomy) or to remove loose pieces of bone and cartilage. Joint replacement may be needed in advanced states of osteoarthritis. HOME CARE INSTRUCTIONS   Take medicines only as directed by your health care provider.  Maintain a healthy weight. Follow your health care provider's instructions for weight control. This may include dietary instructions.  Exercise as directed. Your health care provider can recommend specific types of exercise. These may include:  Strengthening exercises. These are done to strengthen the muscles that support joints affected by arthritis. They can be performed with weights or with exercise bands to add resistance.  Aerobic activities. These are exercises, such as brisk walking or low-impact aerobics, that get your heart pumping.  Range-of-motion activities. These keep your joints limber.  Balance and agility exercises. These help you maintain daily living skills.  Rest your affected joints as directed by your health care provider.  Keep all follow-up visits as directed by your health care provider. SEEK  MEDICAL CARE IF:   Your skin turns red.  You develop a rash in addition to your joint pain.  You have worsening joint pain.  You have a fever along with joint or muscle aches. SEEK IMMEDIATE MEDICAL CARE IF:  You have a significant  loss of weight or appetite.  You have night sweats. [place] of Arthritis and Musculoskeletal and Skin Diseases: www.niams.SouthExposed.es  [employer] on Aging: http://kim-miller.com/  American College of Rheumatology: www.rheumatology.org Document Released: 11/14/2005 Document Revised: 03/31/2014 Document Reviewed: 07/22/2013 [facility] Patient Information 2015 [place], [place]. This information is not intended to replace advice given to you by your health care provider. Make sure you discuss any questions you have with your health care provider.

## 2015-08-14 NOTE — Assessment & Plan Note (Signed)
Anticipate worsening osteoarthritis. Given prolonged sxs, will check bilat weight-bearing knee xrays today to eval for arthritic burden.  Pt declines steroid injection. Discussed ice/heating pad and continued brace use.  Consider PT referral depending on xray results. rec start ibuprofen 400mg  bid with food x 5 days then PRN.  Pt agrees with plan.

## 2015-08-15 ENCOUNTER — Other Ambulatory Visit: Payer: Self-pay | Admitting: Family Medicine

## 2015-09-04 ENCOUNTER — Telehealth: Payer: Self-pay | Admitting: Family Medicine

## 2015-09-04 DIAGNOSIS — F39 Unspecified mood [affective] disorder: Secondary | ICD-10-CM

## 2015-09-04 NOTE — Telephone Encounter (Signed)
May come in for fasting lab, doesn't need office visit. Lab ordered.

## 2015-09-04 NOTE — Telephone Encounter (Signed)
Pt scheduled and aware for appt on 09/07/15.

## 2015-09-04 NOTE — Telephone Encounter (Signed)
Pt called wanted to schedule a lab appointment to test blood sugar. Dr. Clovis Pu is concerned about the sleeping medication and side effects of sugar levels. Can we schedule him on the lab or would you like to see him for an appointment first?

## 2015-09-07 ENCOUNTER — Other Ambulatory Visit (INDEPENDENT_AMBULATORY_CARE_PROVIDER_SITE_OTHER): Payer: PPO

## 2015-09-07 DIAGNOSIS — F39 Unspecified mood [affective] disorder: Secondary | ICD-10-CM

## 2015-09-07 LAB — BASIC METABOLIC PANEL
BUN: 25 mg/dL — AB (ref 6–23)
CALCIUM: 8.9 mg/dL (ref 8.4–10.5)
CO2: 29 mEq/L (ref 19–32)
CREATININE: 1 mg/dL (ref 0.40–1.50)
Chloride: 106 mEq/L (ref 96–112)
GFR: 75.96 mL/min (ref >60.00)
GLUCOSE: 93 mg/dL (ref 70–99)
Potassium: 4 mEq/L (ref 3.5–5.1)
SODIUM: 143 meq/L (ref 135–145)

## 2015-09-14 ENCOUNTER — Encounter: Payer: Self-pay | Admitting: *Deleted

## 2015-09-16 DIAGNOSIS — D239 Other benign neoplasm of skin, unspecified: Secondary | ICD-10-CM

## 2015-09-16 HISTORY — DX: Other benign neoplasm of skin, unspecified: D23.9

## 2015-11-28 ENCOUNTER — Other Ambulatory Visit: Payer: Self-pay | Admitting: Family Medicine

## 2016-01-21 DIAGNOSIS — L578 Other skin changes due to chronic exposure to nonionizing radiation: Secondary | ICD-10-CM | POA: Diagnosis not present

## 2016-01-21 DIAGNOSIS — Z1283 Encounter for screening for malignant neoplasm of skin: Secondary | ICD-10-CM | POA: Diagnosis not present

## 2016-01-21 DIAGNOSIS — L812 Freckles: Secondary | ICD-10-CM | POA: Diagnosis not present

## 2016-01-21 DIAGNOSIS — D485 Neoplasm of uncertain behavior of skin: Secondary | ICD-10-CM | POA: Diagnosis not present

## 2016-01-21 DIAGNOSIS — L570 Actinic keratosis: Secondary | ICD-10-CM

## 2016-01-21 DIAGNOSIS — Z85820 Personal history of malignant melanoma of skin: Secondary | ICD-10-CM | POA: Diagnosis not present

## 2016-01-21 DIAGNOSIS — L821 Other seborrheic keratosis: Secondary | ICD-10-CM | POA: Diagnosis not present

## 2016-01-21 DIAGNOSIS — D229 Melanocytic nevi, unspecified: Secondary | ICD-10-CM | POA: Diagnosis not present

## 2016-01-21 HISTORY — DX: Actinic keratosis: L57.0

## 2016-02-11 DIAGNOSIS — H109 Unspecified conjunctivitis: Secondary | ICD-10-CM | POA: Diagnosis not present

## 2016-02-12 ENCOUNTER — Other Ambulatory Visit: Payer: Self-pay | Admitting: Family Medicine

## 2016-02-14 ENCOUNTER — Encounter: Payer: Self-pay | Admitting: Medical Oncology

## 2016-02-14 ENCOUNTER — Emergency Department: Payer: PPO

## 2016-02-14 ENCOUNTER — Emergency Department
Admission: EM | Admit: 2016-02-14 | Discharge: 2016-02-14 | Disposition: A | Discharge status: Home / Self Care | Payer: PPO | Attending: Emergency Medicine | Admitting: Emergency Medicine

## 2016-02-14 DIAGNOSIS — C4490 Unspecified malignant neoplasm of skin, unspecified: Secondary | ICD-10-CM | POA: Diagnosis not present

## 2016-02-14 DIAGNOSIS — S0990XA Unspecified injury of head, initial encounter: Secondary | ICD-10-CM | POA: Diagnosis not present

## 2016-02-14 DIAGNOSIS — F329 Major depressive disorder, single episode, unspecified: Secondary | ICD-10-CM | POA: Diagnosis not present

## 2016-02-14 DIAGNOSIS — Y929 Unspecified place or not applicable: Secondary | ICD-10-CM | POA: Insufficient documentation

## 2016-02-14 DIAGNOSIS — I10 Essential (primary) hypertension: Secondary | ICD-10-CM | POA: Insufficient documentation

## 2016-02-14 DIAGNOSIS — D291 Benign neoplasm of prostate: Secondary | ICD-10-CM | POA: Insufficient documentation

## 2016-02-14 DIAGNOSIS — W01198A Fall on same level from slipping, tripping and stumbling with subsequent striking against other object, initial encounter: Secondary | ICD-10-CM | POA: Diagnosis not present

## 2016-02-14 DIAGNOSIS — J0100 Acute maxillary sinusitis, unspecified: Secondary | ICD-10-CM | POA: Insufficient documentation

## 2016-02-14 DIAGNOSIS — Z79899 Other long term (current) drug therapy: Secondary | ICD-10-CM | POA: Insufficient documentation

## 2016-02-14 DIAGNOSIS — Y999 Unspecified external cause status: Secondary | ICD-10-CM | POA: Diagnosis not present

## 2016-02-14 DIAGNOSIS — W19XXXA Unspecified fall, initial encounter: Secondary | ICD-10-CM

## 2016-02-14 DIAGNOSIS — Y9301 Activity, walking, marching and hiking: Secondary | ICD-10-CM | POA: Diagnosis not present

## 2016-02-14 DIAGNOSIS — S60221A Contusion of right hand, initial encounter: Secondary | ICD-10-CM

## 2016-02-14 DIAGNOSIS — S0093XA Contusion of unspecified part of head, initial encounter: Secondary | ICD-10-CM

## 2016-02-14 DIAGNOSIS — J329 Chronic sinusitis, unspecified: Secondary | ICD-10-CM | POA: Diagnosis not present

## 2016-02-14 DIAGNOSIS — Z888 Allergy status to other drugs, medicaments and biological substances status: Secondary | ICD-10-CM | POA: Insufficient documentation

## 2016-02-14 DIAGNOSIS — E7800 Pure hypercholesterolemia, unspecified: Secondary | ICD-10-CM | POA: Diagnosis not present

## 2016-02-14 DIAGNOSIS — S0083XA Contusion of other part of head, initial encounter: Secondary | ICD-10-CM | POA: Diagnosis not present

## 2016-02-14 DIAGNOSIS — S01311A Laceration without foreign body of right ear, initial encounter: Secondary | ICD-10-CM | POA: Insufficient documentation

## 2016-02-14 DIAGNOSIS — S60511A Abrasion of right hand, initial encounter: Secondary | ICD-10-CM | POA: Diagnosis not present

## 2016-02-14 MED ORDER — FLUTICASONE PROPIONATE 50 MCG/ACT NA SUSP: 2.0000 | Freq: Every day | NASAL | Status: DC

## 2016-02-14 MED ORDER — AMOXICILLIN 875 MG PO TABS: 875.0000 mg | ORAL_TABLET | Freq: Two times a day (BID) | ORAL | Status: DC
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
## 2016-02-14 NOTE — Discharge Instructions (Signed)
Cryotherapy Cryotherapy is when you put ice on your injury. Ice helps lessen pain and puffiness (swelling) after an injury. Ice works the best when you start using it in the first 24 to 48 hours after an injury. HOME CARE  Put a dry or damp towel between the ice pack and your skin.  You may press gently on the ice pack.  Leave the ice on for no more than 10 to 20 minutes at a time.  Check your skin after 5 minutes to make sure your skin is okay.  Rest at least 20 minutes between ice pack uses.  Stop using ice when your skin loses feeling (numbness).  Do not use ice on someone who cannot tell you when it hurts. This includes small children and people with memory problems (dementia). GET HELP RIGHT AWAY IF:  You have white spots on your skin.  Your skin turns blue or pale.  Your skin feels waxy or hard.  Your puffiness gets worse. MAKE SURE YOU:   Understand these instructions.  Will watch your condition.  Will get help right away if you are not doing well or get worse.   This information is not intended to replace advice given to you by your health care provider. Make sure you discuss any questions you have with your health care provider.   Document Released: 05/02/2008 Document Revised: 02/06/2012 Document Reviewed: 07/07/2011 Elsevier Interactive Patient Education 2016 Elsevier Inc[employer]  Facial or Scalp Contusion  A facial or scalp contusion is a deep bruise on the face or head. Contusions happen when an injury causes bleeding under the skin. Signs of bruising include pain, puffiness (swelling), and discolored skin. The contusion may turn blue, purple, or yellow. HOME CARE  Only take medicines as told by your doctor.  Put ice on the injured area.  Put ice in a plastic bag.  Place a towel between your skin and the bag.  Leave the ice on for 20 minutes, 2-3 times a day. GET HELP IF:  You have bite problems.  You have pain when chewing.  You are worried about  your face not healing normally. GET HELP RIGHT AWAY IF:   You have severe pain or a headache and medicine does not help.  You are very tired or confused, or your personality changes.  You throw up (vomit).  You have a nosebleed that will not stop.  You see two of everything (double vision) or have blurry vision.  You have fluid coming from your nose or ear.  You have problems walking or using your arms or legs. MAKE SURE YOU:   Understand these instructions.  Will watch your condition.  Will get help right away if you are not doing well or get worse.   This information is not intended to replace advice given to you by your health care provider. Make sure you discuss any questions you have with your health care provider.   Document Released: 11/03/2011 Document Revised: 12/05/2014 Document Reviewed: 06/27/2013 Elsevier Interactive Patient Education 2016 [place]  A hand contusion is a deep bruise to the hand. Contusions happen when an injury causes bleeding under the skin. Signs of bruising include pain, puffiness (swelling), and discolored skin. The contusion may turn blue, purple, or yellow. HOME CARE  Put ice on the injured area.  Put ice in a plastic bag.  Place a towel between your skin and the bag.  Leave the ice on for 15-20 minutes, 03-04 times a  day.  Only take medicines as told by your doctor.  Use an elastic wrap only as told. You may remove the wrap for sleeping, showering, and bathing. Take the wrap off if you lose feeling (have numbness) in your fingers, or they turn blue or cold. Put the wrap on more loosely.  Keep the hand raised (elevated) with pillows.  Avoid using your hand too much if it is painful. GET HELP RIGHT AWAY IF:   You have more redness, puffiness, or pain in your hand.  Your puffiness or pain does not get better with medicine.  You lose feeling in your hand, or you cannot move your fingers.  Your hand turns  cold or blue.  You have pain when you move your fingers.  Your hand feels warm.  Your contusion does not get better in 2 days. MAKE SURE YOU:   Understand these instructions.  Will watch this condition.  Will get help right away if you are not doing well or you get worse.   This information is not intended to replace advice given to you by your health care provider. Make sure you discuss any questions you have with your health care provider.   Document Released: 05/02/2008 Document Revised: 12/05/2014 Document Reviewed: 05/07/2012 Elsevier Interactive Patient Education 2016 [place] Injury, Adult You have a head injury. Headaches and throwing up (vomiting) are common after a head injury. It should be easy to wake up from sleeping. Sometimes you must stay in the hospital. Most problems happen within the first 24 hours. Side effects may occur up to 7-10 days after the injury.  WHAT ARE THE TYPES OF HEAD INJURIES? Head injuries can be as minor as a bump. Some head injuries can be more severe. More severe head injuries include:  A jarring injury to the brain (concussion).  A bruise of the brain (contusion). This mean there is bleeding in the brain that can cause swelling.  A cracked skull (skull fracture).  Bleeding in the brain that collects, clots, and forms a bump (hematoma). WHEN SHOULD I GET HELP RIGHT AWAY?   You are confused or sleepy.  You cannot be woken up.  You feel sick to your stomach (nauseous) or keep throwing up (vomiting).  Your dizziness or unsteadiness is getting worse.  You have very bad, lasting headaches that are not helped by medicine. Take medicines only as told by your doctor.  You cannot use your arms or legs like normal.  You cannot walk.  You notice changes in the black spots in the center of the colored part of your eye (pupil).  You have clear or bloody fluid coming from your nose or ears.  You have trouble seeing. During the  next 24 hours after the injury, you must stay with someone who can watch you. This person should get help right away (call 911 in the [place] if you start to shake and are not able to control it (have seizures), you pass out, or you are unable to wake up. HOW CAN I PREVENT A HEAD INJURY IN THE FUTURE?  Wear seat belts.  Wear a helmet while bike riding and playing sports like football.  Stay away from dangerous activities around the house. WHEN CAN I RETURN TO NORMAL ACTIVITIES AND ATHLETICS? See your doctor before doing these activities. You should not do normal activities or play contact sports until 1 week after the following symptoms have stopped:  Headache that does not go away.  Dizziness.  Poor attention.  Confusion.  Memory problems.  Sickness to your stomach or throwing up.  Tiredness.  Fussiness.  Bothered by bright lights or loud noises.  Anxiousness or depression.  Restless sleep. MAKE SURE YOU:   Understand these instructions.  Will watch your condition.  Will get help right away if you are not doing well or get worse.   This information is not intended to replace advice given to you by your health care provider. Make sure you discuss any questions you have with your health care provider.   Document Released: 10/27/2008 Document Revised: 12/05/2014 Document Reviewed: 07/22/2013 Elsevier Interactive Patient Education 2016 [place], or Adhesive Wound Closure Doctors use stitches (sutures), staples, and certain glue (skin adhesives) to hold your skin together while it heals (wound closure). You may need this treatment after you have surgery or if you cut your skin accidentally. These methods help your skin heal more quickly. They also make it less likely that you will have a scar. WHAT ARE THE DIFFERENT KINDS OF WOUND CLOSURES? There are many options for wound closure. The one that your doctor uses depends on how deep and large your  wound is. Adhesive Glue To use this glue to close a wound, your doctor holds the edges of the wound together and paints the glue on the surface of your skin. You may need more than one layer of glue. Then the wound may be covered with a light bandage (dressing). This type of skin closure may be used for small wounds that are not deep (superficial). Using glue for wound closure is less painful than other methods. It does not require a medicine that numbs the area. This method also leaves nothing to be removed. Adhesive glue is often used for children and on facial wounds. Adhesive glue cannot be used for wounds that are deep, uneven, or bleeding. It is not used inside of a wound.  Adhesive Strips These strips are made of sticky (adhesive), porous paper. They are placed across your skin edges like a regular adhesive bandage. You leave them on until they fall off. Adhesive strips may be used to close very superficial wounds. They may also be used along with sutures to improve closure of your skin edges.  Sutures Sutures are the oldest method of wound closure. Sutures can be made from natural or synthetic materials. They can be made from a material that your body can break down as your wound heals (absorbable), or they can be made from a material that needs to be removed from your skin (nonabsorbable). They come in many different strengths and sizes. Your doctor attaches the sutures to a steel needle on one end. Sutures can be passed through your skin, or through the tissues beneath your skin. Then they are tied and cut. Your skin edges may be closed in one continuous stitch or in separate stitches. Sutures are strong and can be used for all kinds of wounds. Absorbable sutures may be used to close tissues under the skin. The disadvantage of sutures is that they may cause skin reactions that lead to infection. Nonabsorbable sutures need to be removed. Staples When surgical staples are used to close a wound,  the edges of your skin on both sides of the wound are brought close together. A staple is placed across the wound, and an instrument secures the edges together. Staples are often used to close surgical cuts (incisions). Staples are faster to use than sutures, and they cause less  reaction from your skin. Staples need to be removed using a tool that bends the staples away from your skin. HOW DO I CARE FOR MY WOUND CLOSURE?  Take medicines only as told by your doctor.  If you were prescribed an antibiotic medicine for your wound, finish it all even if you start to feel better.  Use ointments or creams only as told by your doctor.  Wash your hands with soap and water before and after touching your wound.  Do not soak your wound in water. Do not take baths, swim, or use a hot tub until your doctor says it is okay.  Ask your doctor when you can start showering. Cover your wound if told by your doctor.  Do not take out your own sutures or staples.  Do not pick at your wound. Picking can cause an infection.  Keep all follow-up visits as told by your doctor. This is important. HOW LONG WILL I HAVE MY WOUND CLOSURE?   Leave adhesive glue on your skin until the glue peels away.  Leave adhesive strips on your skin until they fall off.  Absorbable sutures will dissolve within several days.  Nonabsorbable sutures and staples must be removed. The location of the wound will determine how long they stay in. This can range from several days to a couple of weeks. WHEN SHOULD I SEEK HELP FOR MY WOUND CLOSURE? Contact your doctor if:  You have a fever.  You have chills.  You have redness, puffiness (swelling), or pain at the site of your wound.  You have fluid, blood, or pus coming from your wound.  There is a bad smell coming from your wound.  The skin edges of your wound start to separate after your sutures have been removed.  Your wound becomes thick, raised, and darker in color after your  sutures come out (scarring).   This information is not intended to replace advice given to you by your health care provider. Make sure you discuss any questions you have with your health care provider.   Document Released: 09/11/2009 Document Revised: 12/05/2014 Document Reviewed: 04/23/2014 Elsevier Interactive Patient Education [employer].

## 2016-02-14 NOTE — ED Notes (Signed)
Pt reports he got up during the night and did not cut the light on and walked into the wall and fell onto the corner of a night stand. Laceration to side of face. Pt denies LOC. Denies dizziness.

## 2016-02-16 ENCOUNTER — Telehealth: Payer: Self-pay

## 2016-02-16 NOTE — Telephone Encounter (Signed)
YUTA VANGINKEL Self [phone] after Saturday evening -- cell # after Friday [phone]  Joel dropped off disability Parking Placard to be filled out. Call when ready. Placed in RX Box up front.

## 2016-02-17 NOTE — Telephone Encounter (Signed)
In your IN box for completion.  

## 2016-02-18 DIAGNOSIS — H109 Unspecified conjunctivitis: Secondary | ICD-10-CM | POA: Diagnosis not present

## 2016-02-21 NOTE — Telephone Encounter (Signed)
Filled and in Kims' box. - would check to see if dx is still knee arthritis and if it is worsening - previousy was not affecting ability to walk. Is he using any ambulatory assistive device?

## 2016-02-23 NOTE — Telephone Encounter (Signed)
Message left for patient to return my call.  

## 2016-02-23 NOTE — Telephone Encounter (Signed)
Spoke with patient. He said placard is due to bilateral knee arthritis that he said has worsened. It seems to get worse when sitting and then his knees get stiff and painful when he goes to stand which makes it difficult to walk. He isn't using a cane or any other assistive device at this time.

## 2016-03-10 DIAGNOSIS — L570 Actinic keratosis: Secondary | ICD-10-CM | POA: Diagnosis not present

## 2016-03-17 DIAGNOSIS — Z961 Presence of intraocular lens: Secondary | ICD-10-CM | POA: Diagnosis not present

## 2016-03-24 ENCOUNTER — Ambulatory Visit (INDEPENDENT_AMBULATORY_CARE_PROVIDER_SITE_OTHER): Payer: PPO | Admitting: Family Medicine

## 2016-03-24 ENCOUNTER — Encounter: Payer: Self-pay | Admitting: Family Medicine

## 2016-03-24 VITALS — BP 112/71 | HR 76 | Resp 16 | Ht 68.0 in | Wt 146.0 lb

## 2016-03-24 DIAGNOSIS — K219 Gastro-esophageal reflux disease without esophagitis: Secondary | ICD-10-CM | POA: Diagnosis not present

## 2016-03-24 DIAGNOSIS — F32A Depression, unspecified: Secondary | ICD-10-CM

## 2016-03-24 DIAGNOSIS — F329 Major depressive disorder, single episode, unspecified: Secondary | ICD-10-CM

## 2016-03-24 DIAGNOSIS — Z7689 Persons encountering health services in other specified circumstances: Secondary | ICD-10-CM | POA: Insufficient documentation

## 2016-03-24 DIAGNOSIS — N400 Enlarged prostate without lower urinary tract symptoms: Secondary | ICD-10-CM | POA: Insufficient documentation

## 2016-03-24 DIAGNOSIS — I10 Essential (primary) hypertension: Secondary | ICD-10-CM | POA: Insufficient documentation

## 2016-03-24 DIAGNOSIS — G250 Essential tremor: Secondary | ICD-10-CM

## 2016-03-24 DIAGNOSIS — C4359 Malignant melanoma of other part of trunk: Secondary | ICD-10-CM

## 2016-03-24 DIAGNOSIS — Z7189 Other specified counseling: Secondary | ICD-10-CM | POA: Diagnosis not present

## 2016-03-24 DIAGNOSIS — N401 Enlarged prostate with lower urinary tract symptoms: Secondary | ICD-10-CM | POA: Diagnosis not present

## 2016-03-24 DIAGNOSIS — N138 Other obstructive and reflux uropathy: Secondary | ICD-10-CM

## 2016-03-24 DIAGNOSIS — G4700 Insomnia, unspecified: Secondary | ICD-10-CM

## 2016-03-24 DIAGNOSIS — Z85820 Personal history of malignant melanoma of skin: Secondary | ICD-10-CM | POA: Insufficient documentation

## 2016-03-24 MED ORDER — OMEPRAZOLE 40 MG PO CPDR: 40.0000 mg | DELAYED_RELEASE_CAPSULE | Freq: Every day | ORAL | Status: DC

## 2016-03-24 MED ORDER — DOXAZOSIN MESYLATE 4 MG PO TABS: 4.0000 mg | ORAL_TABLET | Freq: Every day | ORAL | Status: DC

## 2016-03-24 MED ORDER — PRIMIDONE 50 MG PO TABS: 50.0000 mg | ORAL_TABLET | Freq: Three times a day (TID) | ORAL | Status: DC
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
## 2016-03-25 DIAGNOSIS — K219 Gastro-esophageal reflux disease without esophagitis: Secondary | ICD-10-CM | POA: Diagnosis not present

## 2016-03-25 DIAGNOSIS — C4359 Malignant melanoma of other part of trunk: Secondary | ICD-10-CM | POA: Diagnosis not present

## 2016-03-26 LAB — CBC WITH DIFFERENTIAL/PLATELET
BASOS: 1 %
Basophils Absolute: 0 10*3/uL (ref 0.0–0.2)
EOS (ABSOLUTE): 0.1 10*3/uL (ref 0.0–0.4)
Eos: 4 %
Hematocrit: 37.2 % — ABNORMAL LOW (ref 37.5–51.0)
Hemoglobin: 12.4 g/dL — ABNORMAL LOW (ref 12.6–17.7)
IMMATURE GRANS (ABS): 0 10*3/uL (ref 0.0–0.1)
IMMATURE GRANULOCYTES: 0 %
LYMPHS: 30 %
Lymphocytes Absolute: 0.9 10*3/uL (ref 0.7–3.1)
MCH: 30.2 pg (ref 26.6–33.0)
MCHC: 33.3 g/dL (ref 31.5–35.7)
MCV: 91 fL (ref 79–97)
MONOS ABS: 0.3 10*3/uL (ref 0.1–0.9)
Monocytes: 11 %
NEUTROS PCT: 54 %
Neutrophils Absolute: 1.6 10*3/uL (ref 1.4–7.0)
PLATELETS: 278 10*3/uL (ref 150–379)
RBC: 4.11 x10E6/uL — AB (ref 4.14–5.80)
RDW: 13.5 % (ref 12.3–15.4)
WBC: 3 10*3/uL — AB (ref 3.4–10.8)

## 2016-03-26 LAB — COMPREHENSIVE METABOLIC PANEL
A/G RATIO: 2 (ref 1.2–2.2)
ALT: 12 IU/L (ref 0–44)
AST: 15 IU/L (ref 0–40)
Albumin: 4.2 g/dL (ref 3.5–4.7)
Alkaline Phosphatase: 88 IU/L (ref 39–117)
BUN / CREAT RATIO: 20 (ref 10–24)
BUN: 20 mg/dL (ref 8–27)
Bilirubin Total: 0.3 mg/dL (ref 0.0–1.2)
CALCIUM: 9.1 mg/dL (ref 8.6–10.2)
CO2: 22 mmol/L (ref 18–29)
CREATININE: 0.99 mg/dL (ref 0.76–1.27)
Chloride: 105 mmol/L (ref 96–106)
GFR calc Af Amer: 82 mL/min/{1.73_m2} (ref >59)
GFR, EST NON AFRICAN AMERICAN: 71 mL/min/{1.73_m2} (ref >59)
GLUCOSE: 96 mg/dL (ref 65–99)
Globulin, Total: 2.1 g/dL (ref 1.5–4.5)
POTASSIUM: 4.2 mmol/L (ref 3.5–5.2)
SODIUM: 144 mmol/L (ref 134–144)
Total Protein: 6.3 g/dL (ref 6.0–8.5)

## 2016-03-26 LAB — LIPID PANEL
CHOL/HDL RATIO: 3.8 ratio (ref 0.0–5.0)
Cholesterol, Total: 185 mg/dL (ref 100–199)
HDL: 49 mg/dL (ref >39)
LDL Calculated: 106 mg/dL — ABNORMAL HIGH (ref 0–99)
TRIGLYCERIDES: 152 mg/dL — AB (ref 0–149)
VLDL Cholesterol Cal: 30 mg/dL (ref 5–40)

## 2016-04-15 DIAGNOSIS — F332 Major depressive disorder, recurrent severe without psychotic features: Secondary | ICD-10-CM | POA: Diagnosis not present

## 2016-06-09 DIAGNOSIS — L578 Other skin changes due to chronic exposure to nonionizing radiation: Secondary | ICD-10-CM | POA: Diagnosis not present

## 2016-06-09 DIAGNOSIS — L570 Actinic keratosis: Secondary | ICD-10-CM | POA: Diagnosis not present

## 2016-06-16 IMAGING — CR DG CHEST 2V
2 series · 2 of 2 positions shown · non-contrast
Comparison: None.

CLINICAL DATA: Disorientation

EXAM:
CHEST  2 VIEW

[w chest pa]
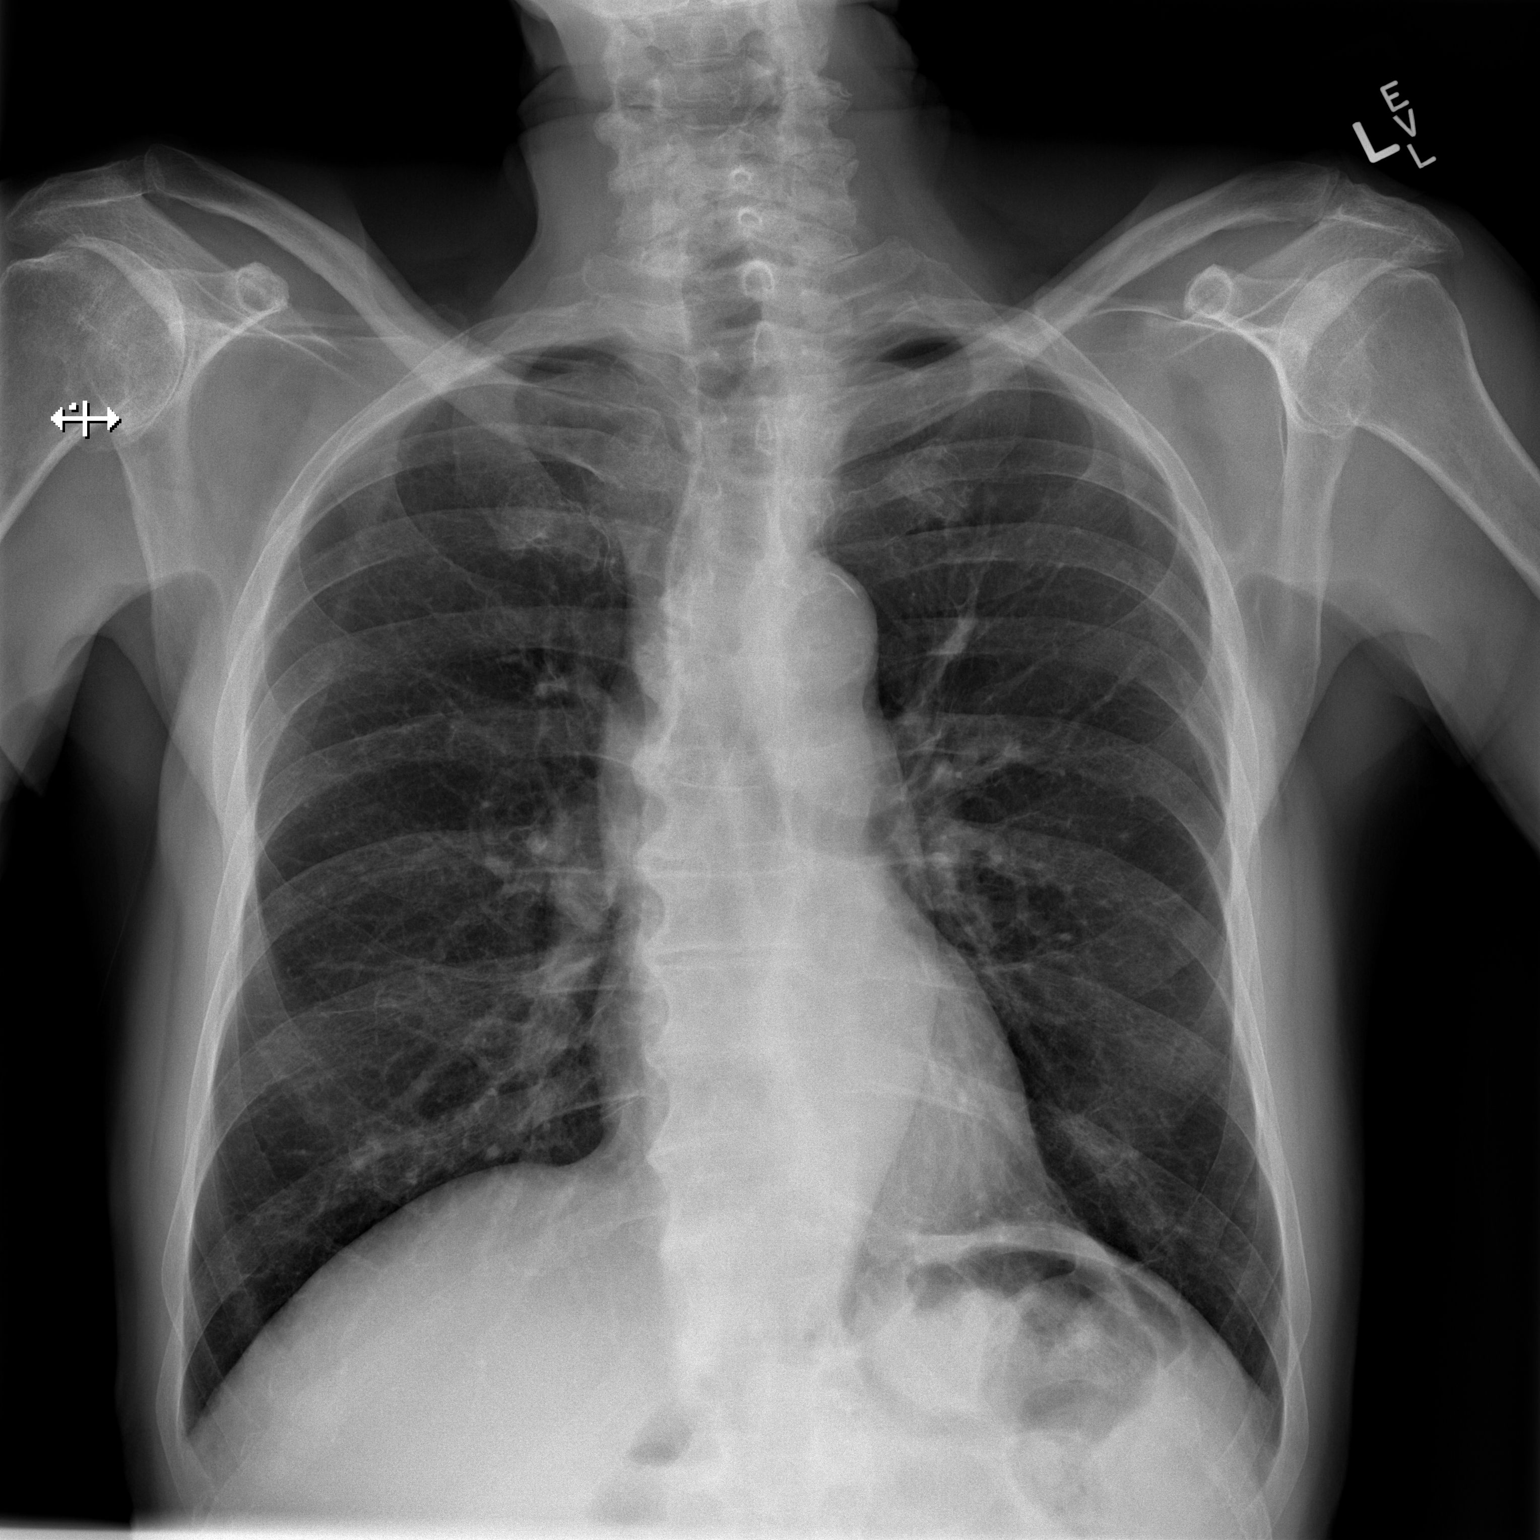

[w chest lat]
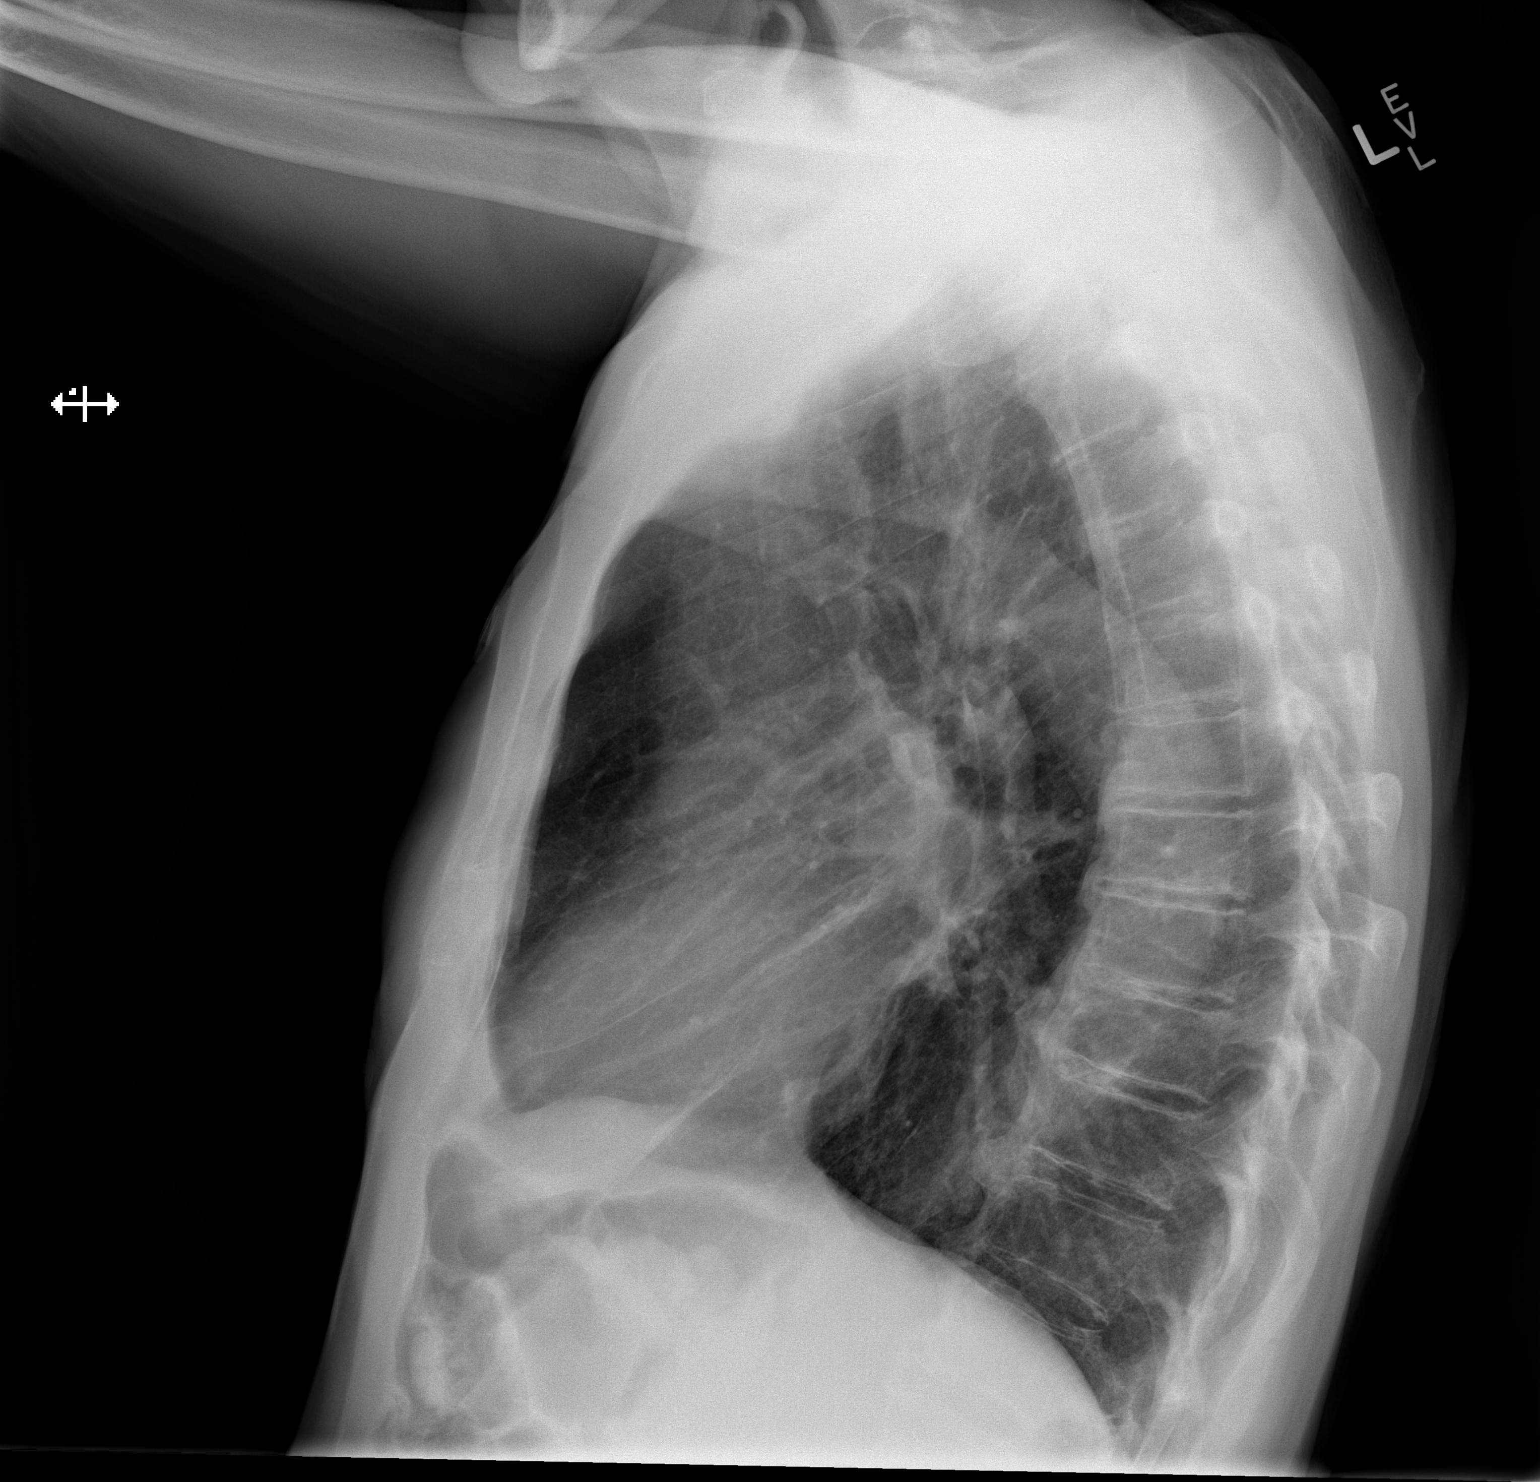

[2 of 2 positions shown; findings below may reference images not displayed]

FINDINGS: No active infiltrate or effusion is seen. Mediastinal and hilar
contours are unremarkable. The heart is within normal limits in
size. There are degenerative changes throughout the thoracic spine.
IMPRESSION: No active cardiopulmonary disease.

## 2016-07-28 DIAGNOSIS — L578 Other skin changes due to chronic exposure to nonionizing radiation: Secondary | ICD-10-CM | POA: Diagnosis not present

## 2016-07-28 DIAGNOSIS — L821 Other seborrheic keratosis: Secondary | ICD-10-CM | POA: Diagnosis not present

## 2016-07-28 DIAGNOSIS — D1800 Hemangioma unspecified site: Secondary | ICD-10-CM | POA: Diagnosis not present

## 2016-07-28 DIAGNOSIS — D485 Neoplasm of uncertain behavior of skin: Secondary | ICD-10-CM | POA: Diagnosis not present

## 2016-07-28 DIAGNOSIS — Z85820 Personal history of malignant melanoma of skin: Secondary | ICD-10-CM | POA: Diagnosis not present

## 2016-07-28 DIAGNOSIS — L812 Freckles: Secondary | ICD-10-CM | POA: Diagnosis not present

## 2016-07-28 DIAGNOSIS — L570 Actinic keratosis: Secondary | ICD-10-CM | POA: Diagnosis not present

## 2016-07-28 DIAGNOSIS — D229 Melanocytic nevi, unspecified: Secondary | ICD-10-CM | POA: Diagnosis not present

## 2016-07-28 DIAGNOSIS — Z1283 Encounter for screening for malignant neoplasm of skin: Secondary | ICD-10-CM | POA: Diagnosis not present

## 2016-11-15 ENCOUNTER — Ambulatory Visit (INDEPENDENT_AMBULATORY_CARE_PROVIDER_SITE_OTHER): Payer: PPO | Admitting: Family Medicine

## 2016-11-15 ENCOUNTER — Encounter: Payer: Self-pay | Admitting: Family Medicine

## 2016-11-15 VITALS — BP 124/69 | HR 80 | Temp 98.1°F | Resp 16 | Ht 68.0 in | Wt 143.8 lb

## 2016-11-15 DIAGNOSIS — E782 Mixed hyperlipidemia: Secondary | ICD-10-CM | POA: Diagnosis not present

## 2016-11-15 DIAGNOSIS — J300 Vasomotor rhinitis: Secondary | ICD-10-CM | POA: Diagnosis not present

## 2016-11-15 DIAGNOSIS — I10 Essential (primary) hypertension: Secondary | ICD-10-CM | POA: Diagnosis not present

## 2016-11-15 DIAGNOSIS — N400 Enlarged prostate without lower urinary tract symptoms: Secondary | ICD-10-CM | POA: Diagnosis not present

## 2016-11-15 DIAGNOSIS — D649 Anemia, unspecified: Secondary | ICD-10-CM | POA: Diagnosis not present

## 2016-11-15 DIAGNOSIS — R7309 Other abnormal glucose: Secondary | ICD-10-CM

## 2016-11-15 DIAGNOSIS — J310 Chronic rhinitis: Secondary | ICD-10-CM | POA: Insufficient documentation

## 2016-11-15 MED ORDER — AZELASTINE HCL 0.15 % NA SOLN: 2.0000 | Freq: Every day | NASAL | 11 refills | Status: DC

## 2016-11-15 MED ORDER — DOXAZOSIN MESYLATE 8 MG PO TABS: 8.0000 mg | ORAL_TABLET | Freq: Every day | ORAL | 3 refills | Status: DC

## 2016-11-15 NOTE — Assessment & Plan Note (Signed)
Last lipids 02/2016, elevated TG and LDL, not on statin therapy Check future fasting lipids after 02/2017

## 2016-11-15 NOTE — Assessment & Plan Note (Signed)
Well controlled, stable BP today, asymptomatic No known complications Has BPH, on alpha blocker  Plan: 1. Increase Doxazosin IR today from 4mg  daily to 8mg , new rx sent to pharmacy, #90 day supply with refills, reviewed risks and benefits, advised to check BP more closely for now, caution on hypotension especially if symptoms suddenly standing or bending forward, advised to hold dose or reduce by half if symptoms and follow-up, may need extended form tablet, or alternative therapy 2. Check future labs with chemistry, lipids, A1c at next annual physical

## 2016-11-15 NOTE — Progress Notes (Signed)
Subjective:    Patient ID: Rodney Branch, male    DOB: [birth year], 82 y.o.   MRN: [id number]  Rodney Branch is a 82 y.o. male presenting on 11/15/2016 for Benign Prostatic Hypertrophy (need refill doxazosin as per pt needs higher dose) and Cough (onset 2 weeks)  HPI   Postnasal Drip, Chronic Allergic Rhinitis: - Reports recent worsening over past 1-2 weeks with post nasal drainage, has to frequently clear throat and has a productive cough at night or in morning, some throat irritation from mucus. - Does have symptoms persistent year around, >50+ - Tried OTC Mucinex once daily for past 10 days, without significant improvement - Recently saw [facility] (in [place]) about 4-5 years ago, did allergy skin testing (negative), no treatment. He did try OTC Claritin 10mg  for 2 weeks before - Admits ear   BPH, without LUTS: - Reports chronic history of BPH, initially dx around age 62, has had problem for >40 years now, currently treated with Doxazosin 4mg  daily, he was initially started 30-40 years ago on 2mg  daily with good results of controlling BPH symptoms, and recently in past 2 years he was increased up to 4mg  daily due to having some urinary discomfort at beginning of void. Also note was treated previously with Tamsulosin up to max dose without relief - Now today reports same recurrence of symptom present with mild initial dysuria at very onset of void then resolves, sometimes this symptom can be worse in evening and improves in morning, would like to try higher dose of Doxazosin as advised by prior provider. - Previously followed by Urology in [place], last PSA 1.83 (in 2011, previously stable around 2 to 2.3 since 2008), prior DRE reportedly enlarged on exam. No known prior history of abnormal PSA or prostate cancer  CHRONIC HTN: Reports no concerns, has BP cuff at home, does not check regularly. Current Meds - Doxazosin 4mg  daily   Reports good compliance, took meds today.  Tolerating well, w/o complaints. Denies CP, dyspnea, HA, edema, dizziness / lightheadedness especially on standing up or bending forward  H/o Depression / Insomnia: - Followed by Psychiatry Dr Lynder Parents, with [facility] (in [place]), currently taking Quetiapine 600mg  nightly (x 2 of the 300mg  tabs), and Paroxetine 20mg  daily (half of the 40mg  tabs)  Social History  Substance Use Topics  . Smoking status: Never Smoker  . Smokeless tobacco: Never Used  . Alcohol use 0.0 oz/week     Comment: occasonally drinks wine    Review of Systems Per HPI unless specifically indicated above   AUA BPH Symptom Score over past 1 month 1. Sensation of not emptying bladder post void - 0 2. Urinate less than 2 hour after finish last void - 0 (will void about 2x daily) 3. Start/Stop several times during void - 0 4. Difficult to postpone urination - 0 5. Weak urinary stream - 0 6. Push or strain urination - 0 7. Nocturia - 1 times  Total Score: 1 (Mild BPH symptoms - on medication currently)     Objective:    BP 124/69   Pulse 80   Temp 98.1 F (36.7 C) (Oral)   Resp 16   Ht 5\' 8"  (1.727 m)   Wt 143 lb 12.8 oz (65.2 kg)   BMI 21.86 kg/m   Wt Readings from Last 3 Encounters:  11/15/16 143 lb 12.8 oz (65.2 kg)  03/24/16 146 lb (66.2 kg)  02/14/16 144 lb (65.3 kg)  Physical Exam  Constitutional: He appears well-developed and well-nourished. No distress.  Well-appearing, comfortable, cooperative  HENT:  Head: Normocephalic and atraumatic.  Mouth/Throat: Oropharynx is clear and moist.  Frontal / maxillary sinuses non-tender. Nares patent with slightly narrow airways without significant turbinate edema or congestion, no purulence. Bilateral TMs clear without erythema, effusion or bulging, Right ear canal with increased dry cerumen. Oropharynx clear without erythema, exudates, edema or asymmetry.  Eyes: Conjunctivae are normal. Right eye exhibits no discharge. Left  eye exhibits no discharge.  Neck: Normal range of motion. Neck supple.  Cardiovascular: Normal rate and intact distal pulses.   Pulmonary/Chest: Effort normal.  Lymphadenopathy:    He has no cervical adenopathy.  Neurological: He is alert.  Skin: Skin is warm and dry. He is not diaphoretic.  Psychiatric: His behavior is normal. Thought content normal.  Nursing note and vitals reviewed.    I have personally reviewed the following lab results from 02/2016.  Results for orders placed or performed in visit on 03/24/16  Comprehensive Metabolic Panel (CMET)  Result Value Ref Range   Glucose 96 65 - 99 mg/dL   BUN 20 8 - 27 mg/dL   Creatinine, Ser 0.99 0.76 - 1.27 mg/dL   GFR calc non Af Amer 71 >59 mL/min/1.73   GFR calc Af Amer 82 >59 mL/min/1.73   BUN/Creatinine Ratio 20 10 - 24   Sodium 144 134 - 144 mmol/L   Potassium 4.2 3.5 - 5.2 mmol/L   Chloride 105 96 - 106 mmol/L   CO2 22 18 - 29 mmol/L   Calcium 9.1 8.6 - 10.2 mg/dL   Total Protein 6.3 6.0 - 8.5 g/dL   Albumin 4.2 3.5 - 4.7 g/dL   Globulin, Total 2.1 1.5 - 4.5 g/dL   Albumin/Globulin Ratio 2.0 1.2 - 2.2   Bilirubin Total 0.3 0.0 - 1.2 mg/dL   Alkaline Phosphatase 88 39 - 117 IU/L   AST 15 0 - 40 IU/L   ALT 12 0 - 44 IU/L  Lipid Profile  Result Value Ref Range   Cholesterol, Total 185 100 - 199 mg/dL   Triglycerides 152 (H) 0 - 149 mg/dL   HDL 49 >39 mg/dL   VLDL Cholesterol Cal 30 5 - 40 mg/dL   LDL Calculated 106 (H) 0 - 99 mg/dL   Chol/HDL Ratio 3.8 0.0 - 5.0 ratio units  CBC with Differential  Result Value Ref Range   WBC 3.0 (L) 3.4 - 10.8 x10E3/uL   RBC 4.11 (L) 4.14 - 5.80 x10E6/uL   Hemoglobin 12.4 (L) 12.6 - 17.7 g/dL   Hematocrit 37.2 (L) 37.5 - 51.0 %   MCV 91 79 - 97 fL   MCH 30.2 26.6 - 33.0 pg   MCHC 33.3 31.5 - 35.7 g/dL   RDW 13.5 12.3 - 15.4 %   Platelets 278 150 - 379 x10E3/uL   Neutrophils 54 %   Lymphs 30 %   Monocytes 11 %   Eos 4 %   Basos 1 %   Neutrophils Absolute 1.6 1.4 - 7.0  x10E3/uL   Lymphocytes Absolute 0.9 0.7 - 3.1 x10E3/uL   Monocytes Absolute 0.3 0.1 - 0.9 x10E3/uL   EOS (ABSOLUTE) 0.1 0.0 - 0.4 x10E3/uL   Basophils Absolute 0.0 0.0 - 0.2 x10E3/uL   Immature Granulocytes 0 %   Immature Grans (Abs) 0.0 0.0 - 0.1 x10E3/uL      Assessment & Plan:   Problem List Items Addressed This Visit    Normocytic anemia  Chart review with mild gradual worsening normocytic anemia, Hgb from 14 to 12.8, some low WBC - Per chart, last Colonoscopy 2000, due to hematochezia Re-check CBC at next annual physical      Relevant Orders   CBC with Differential/Platelet   Mixed hyperlipidemia    Last lipids 02/2016, elevated TG and LDL, not on statin therapy Check future fasting lipids after 02/2017      Relevant Medications   doxazosin (CARDURA) 8 MG tablet   Other Relevant Orders   Lipid panel   Essential hypertension    Well controlled, stable BP today, asymptomatic No known complications Has BPH, on alpha blocker  Plan: 1. Increase Doxazosin IR today from 4mg  daily to 8mg , new rx sent to pharmacy, #90 day supply with refills, reviewed risks and benefits, advised to check BP more closely for now, caution on hypotension especially if symptoms suddenly standing or bending forward, advised to hold dose or reduce by half if symptoms and follow-up, may need extended form tablet, or alternative therapy 2. Check future labs with chemistry, lipids, A1c at next annual physical      Relevant Medications   doxazosin (CARDURA) 8 MG tablet   Other Relevant Orders   COMPLETE METABOLIC PANEL WITH GFR   Chronic rhinitis    No evidence of acute sinusitis or complication. Consistent with chronic rhinitis, unclear exact etiology seems to be more consistent with allergic rhinitis (but allergy testing negative 4-5 years ago and seems refractory to nasal steroid and anti-histamine oral), also given chronicity and no association to allergies, may be more vasomotor symptoms (no  obvious trigger) - Failed loratadine, cetirizine, flonase - Prior allergy testing  Plan: 1. Start nasal anti-histamine Azelastine 0.15% 2 sprays each nostril once daily, for weeks to month trial 2. In future may need to add Flonase, for dual therapy 2 sprays each nostril daily 3. Lastly can add OTC alternative oral anti-histamine with Allegra 180mg  daily 4. Follow-up in future, as needed, consider 2nd opinion from ENT if needed (however he has seen them in past)      Relevant Medications   Azelastine HCl 0.15 % SOLN   BPH without obstruction/lower urinary tract symptoms - Primary    Consistent with chronic BPH, mostly stable and well controlled, now with some increased early dysuria at onset void only, without any other LUTS or obstructive symptoms. - AUA BPH score 1 (nocturia 1), essentially completely controlled by symptomology - On Doxazosin 4mg  daily, prior failure Tamsulosin up to max dose. Never on finasteride - Last PSA 1.83 (2011), prior stable PSA around 2-2.3  Plan: 1. Increase to Doxazosin IR 8mg  daily, new rx sent. Caution with BP monitoring advised. 2. Follow-up 4-5 months, monitor progress, if need will add Finasteride, limited benefit for BPH above 8mg , consider Urology referral, will check PSA with annual labs      Relevant Medications   doxazosin (CARDURA) 8 MG tablet   Other Relevant Orders   PSA    Other Visit Diagnoses    Abnormal glucose       No prior history of Pre-DM, last A1c 5.5 >4 years ago, due for repeat A1c with prior abnormal elevated glucose   Relevant Orders   Hemoglobin A1c      Meds ordered this encounter  Medications  . doxazosin (CARDURA) 8 MG tablet    Sig: Take 1 tablet (8 mg total) by mouth daily.    Dispense:  90 tablet    Refill:  3  . Azelastine HCl  0.15 % SOLN    Sig: Place 2 sprays into the nose daily.    Dispense:  30 mL    Refill:  11      Follow up plan: Return in about 5 months (around 04/15/2017) for Annual  physical.  Nobie Putnam, DO [facility] 11/15/2016, 4:53 PM

## 2016-11-15 NOTE — Assessment & Plan Note (Signed)
Consistent with chronic BPH, mostly stable and well controlled, now with some increased early dysuria at onset void only, without any other LUTS or obstructive symptoms. - AUA BPH score 1 (nocturia 1), essentially completely controlled by symptomology - On Doxazosin 4mg  daily, prior failure Tamsulosin up to max dose. Never on finasteride - Last PSA 1.83 (2011), prior stable PSA around 2-2.3  Plan: 1. Increase to Doxazosin IR 8mg  daily, new rx sent. Caution with BP monitoring advised. 2. Follow-up 4-5 months, monitor progress, if need will add Finasteride, limited benefit for BPH above 8mg , consider Urology referral, will check PSA with annual labs

## 2016-11-15 NOTE — Assessment & Plan Note (Addendum)
Chart review with mild gradual worsening normocytic anemia, Hgb from 14 to 12.8, some low WBC - Per chart, last Colonoscopy 2000, due to hematochezia Re-check CBC at next annual physical

## 2016-11-15 NOTE — Assessment & Plan Note (Signed)
No evidence of acute sinusitis or complication. Consistent with chronic rhinitis, unclear exact etiology seems to be more consistent with allergic rhinitis (but allergy testing negative 4-5 years ago and seems refractory to nasal steroid and anti-histamine oral), also given chronicity and no association to allergies, may be more vasomotor symptoms (no obvious trigger) - Failed loratadine, cetirizine, flonase - Prior allergy testing  Plan: 1. Start nasal anti-histamine Azelastine 0.15% 2 sprays each nostril once daily, for weeks to month trial 2. In future may need to add Flonase, for dual therapy 2 sprays each nostril daily 3. Lastly can add OTC alternative oral anti-histamine with Allegra 180mg  daily 4. Follow-up in future, as needed, consider 2nd opinion from ENT if needed (however he has seen them in past)

## 2016-11-15 NOTE — Patient Instructions (Addendum)
Thank you for coming in to clinic today.  1. For your Prostate (BPH - benign prostatic hypertrophy) - It sounds like you have been well controlled previously, and this is reasonable to increase dose of Doxasozin - Sent new rx to pharmacy for 8mg  tabs, take one daily - Check Bp at home regularly after starting this med to monitor, make sure it is not dropping significantly below < 100/60, or if you get lightheaded, dizzy, near passing out, fall, or any symptoms with sudden standing or bending forward - Try to stay well hydrated - In future can consider Finasteride as well, which helps shrink your prostate over several weeks to months, is commonly used for BPH - Also if we need we can arrange local [facility] referral  2. For your Rhinitis, whether it is allergic or vasomotor, or other - Try new rx nasal spray (anti-histamine), Azelastine, 0.15% spray, use 2 sprays in each nostril ONCE a day, every day for next several months - If not significant relief, then go ahead and add on Flonase (either existing rx or request a new rx by phone, or can use OTC), do this 2 sprays in each nostril DAILY as well for weeks to months - Lastly if you want to try oral anti-histamine in future, can consider Allegra (24 hour daily tablet, 180mg  - OTC)  ** Reminder to call us back with name and office location of your Psychiatrist, for future reference **  You will be due for FASTING BLOOD WORK (no food or drink after midnight before, only water or coffee without cream/sugar on the morning of)  - Please go ahead and schedule a "Lab Only" visit in the morning at the clinic for lab draw in 4-5 months, anytime after 03/25/17 - Make sure Lab Only appointment is at least 1-2 weeks before your next appointment, so that results will be available  Please schedule a follow-up appointment with Dr. Parks Ranger in 4-6 months for Annual Physical follow-up  If you have any other questions or concerns, please feel free to  call the clinic or send a message through [place]. You may also schedule an earlier appointment if necessary.  Nobie Putnam, DO [facility]

## 2016-12-09 DIAGNOSIS — H6121 Impacted cerumen, right ear: Secondary | ICD-10-CM | POA: Diagnosis not present

## 2016-12-09 DIAGNOSIS — H93291 Other abnormal auditory perceptions, right ear: Secondary | ICD-10-CM | POA: Diagnosis not present

## 2016-12-23 DIAGNOSIS — F332 Major depressive disorder, recurrent severe without psychotic features: Secondary | ICD-10-CM | POA: Diagnosis not present

## 2017-01-18 ENCOUNTER — Other Ambulatory Visit: Payer: Self-pay | Admitting: Family Medicine

## 2017-01-18 DIAGNOSIS — K5901 Slow transit constipation: Secondary | ICD-10-CM

## 2017-01-18 MED ORDER — POLYETHYLENE GLYCOL 3350 17 GM/SCOOP PO POWD: 17.0000 g | Freq: Every day | ORAL | 1 refills | Status: DC

## 2017-01-18 NOTE — Telephone Encounter (Signed)
Last seen patient 10/2016 for other medical problems, establish care with new PCP.  Today patient arrives in to office to request refill on prior med Polyethylene glycol powder (miralax) 17g daily and repeat 2nd dose 17g if persistent constipation, #3350g +1 refill. This med was previously rx by his prior PCP Dr Danise Mina, and he is due for refill, we did not discuss this med at last visit.  Patient admits some frustration with this refill and expresses that if we do not refill it he will find a new provider. He understands that some medications we don't refill unless he is seen in office. I reviewed his chart, and agreed to refill this as it is a chronic med for what appears to be functional constipation, he may continue it, sent refill to [facility] as requested, he can discuss this in future if needed. Patient left office satisfied. No further issues.  Nobie Putnam, [facility] 01/18/2017, 4:14 PM

## 2017-01-23 ENCOUNTER — Other Ambulatory Visit: Payer: Self-pay | Admitting: *Deleted

## 2017-01-23 NOTE — Telephone Encounter (Signed)
Patient has p/u generic miralax.  [place] [id number]

## 2017-02-09 DIAGNOSIS — L578 Other skin changes due to chronic exposure to nonionizing radiation: Secondary | ICD-10-CM | POA: Diagnosis not present

## 2017-02-09 DIAGNOSIS — L570 Actinic keratosis: Secondary | ICD-10-CM | POA: Diagnosis not present

## 2017-02-09 DIAGNOSIS — L812 Freckles: Secondary | ICD-10-CM | POA: Diagnosis not present

## 2017-02-09 DIAGNOSIS — L820 Inflamed seborrheic keratosis: Secondary | ICD-10-CM | POA: Diagnosis not present

## 2017-02-09 DIAGNOSIS — D1800 Hemangioma unspecified site: Secondary | ICD-10-CM | POA: Diagnosis not present

## 2017-02-09 DIAGNOSIS — D229 Melanocytic nevi, unspecified: Secondary | ICD-10-CM | POA: Diagnosis not present

## 2017-02-09 DIAGNOSIS — Z1283 Encounter for screening for malignant neoplasm of skin: Secondary | ICD-10-CM | POA: Diagnosis not present

## 2017-02-09 DIAGNOSIS — Z85820 Personal history of malignant melanoma of skin: Secondary | ICD-10-CM | POA: Diagnosis not present

## 2017-02-09 DIAGNOSIS — L821 Other seborrheic keratosis: Secondary | ICD-10-CM | POA: Diagnosis not present

## 2017-02-09 DIAGNOSIS — D485 Neoplasm of uncertain behavior of skin: Secondary | ICD-10-CM | POA: Diagnosis not present

## 2017-02-18 IMAGING — CR DG KNEE AP/LAT W/ SUNRISE*R*
2 series · 2 of 2 positions shown · non-contrast
Comparison: None.

CLINICAL DATA: Bilateral knee pain, right greater than left.

EXAM:
RIGHT KNEE 3 VIEWS

[view not recorded (1 of 2)]
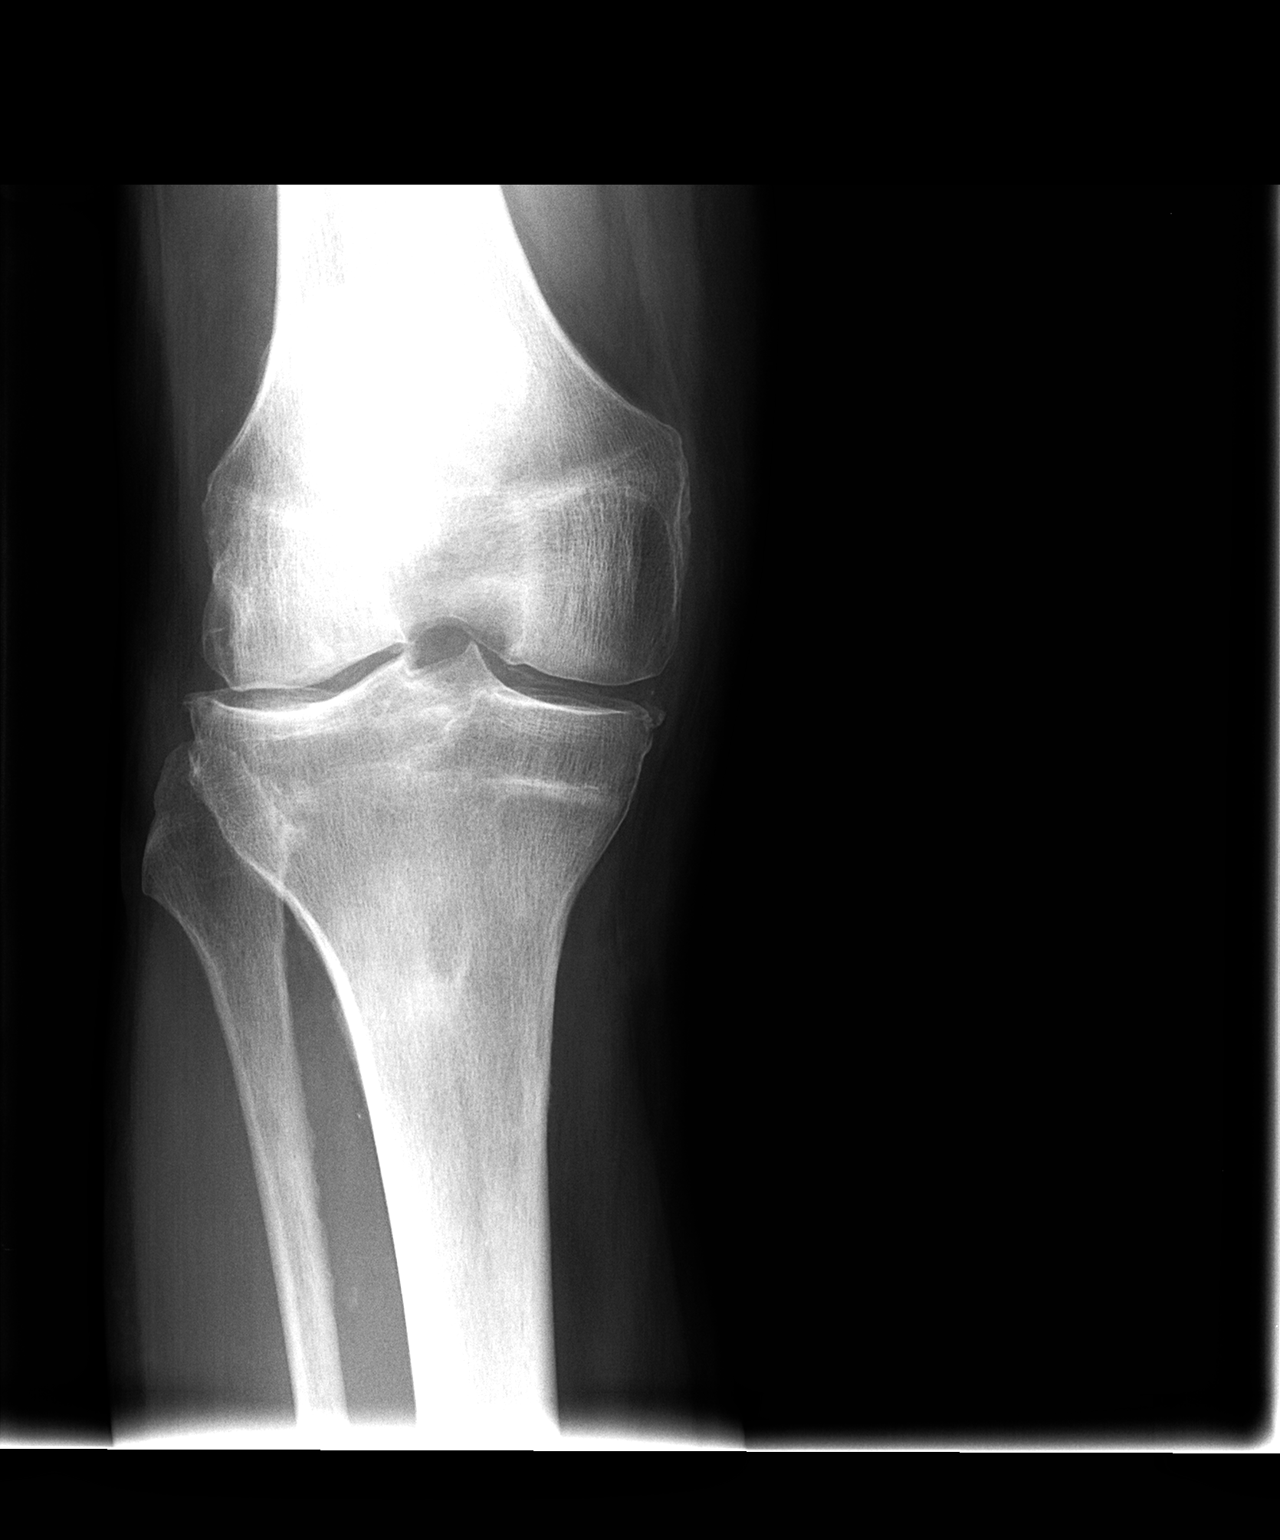

[view not recorded (2 of 2)]
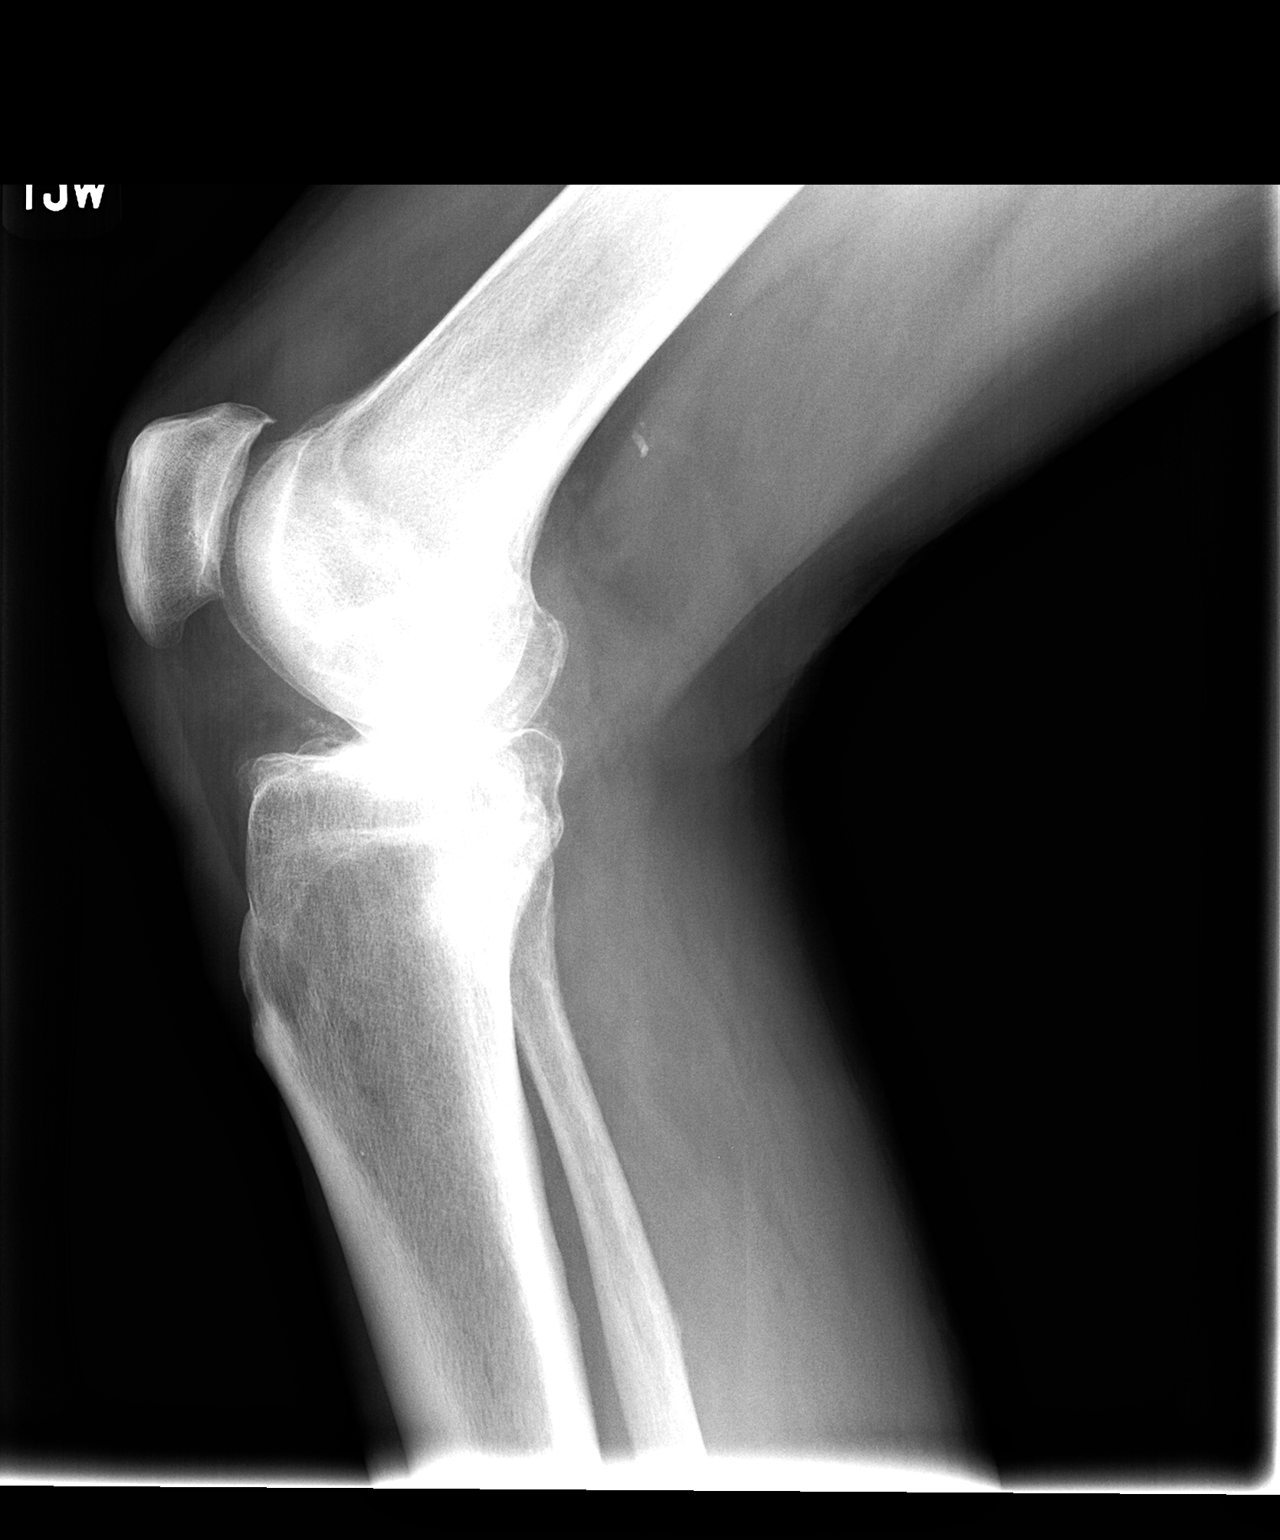

[2 of 2 positions shown; findings below may reference images not displayed]

FINDINGS: No fracture or bone lesion.

There is joint space narrowing most evident of the lateral
compartment with milder narrowing of the patellofemoral joint space
compartment. Chondrocalcinosis is noted along the menisci. There are
small marginal osteophytes from all 3 compartments. A minimal joint
effusion is suggested.
IMPRESSION: 1. No fracture or acute finding.
2. Arthropathic changes. Consider CPPD arthropathy given the
chondrocalcinosis along the menisci and the pattern of joint
compartment involvement.

## 2017-02-18 IMAGING — CR DG KNEE AP/LAT W/ SUNRISE*R*
1 series · 1 of 1 positions shown · non-contrast
Comparison: None.

CLINICAL DATA: Bilateral knee pain, right greater than left.

EXAM:
RIGHT KNEE 3 VIEWS

[view not recorded]
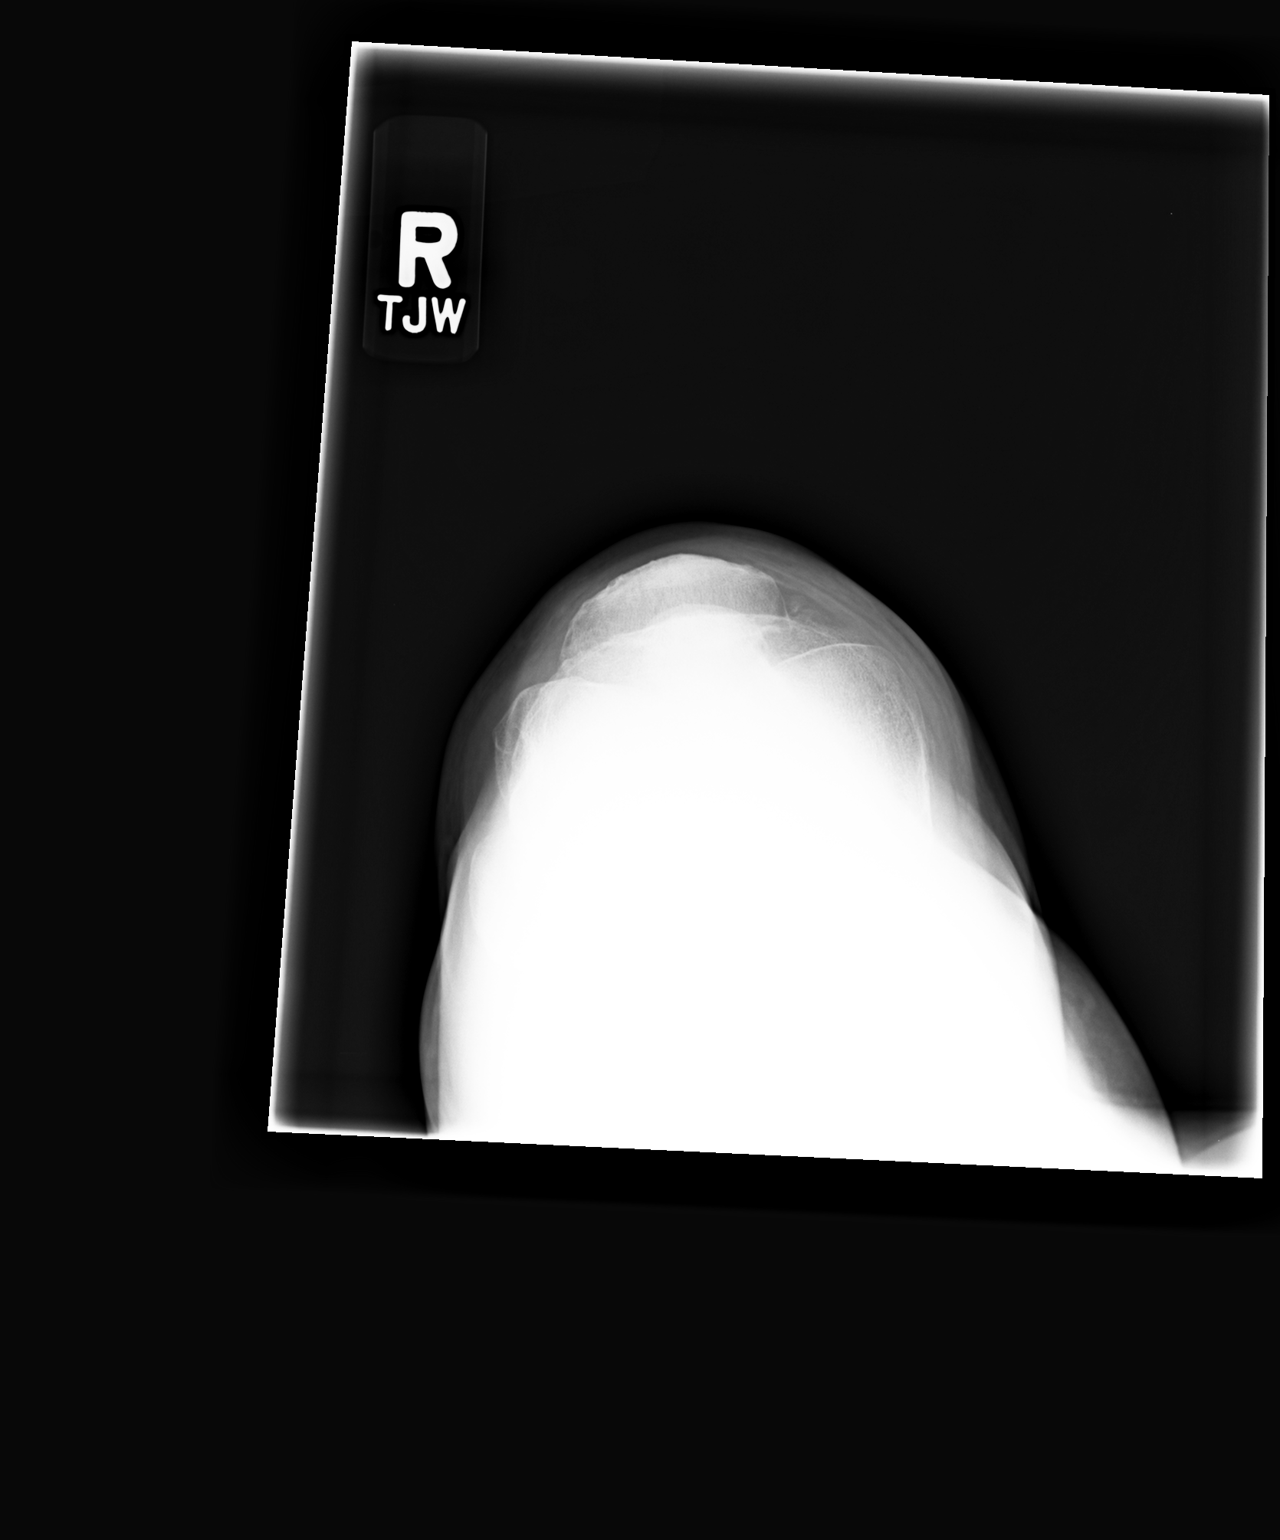

[1 of 1 positions shown; findings below may reference images not displayed]

FINDINGS: No fracture or bone lesion.

There is joint space narrowing most evident of the lateral
compartment with milder narrowing of the patellofemoral joint space
compartment. Chondrocalcinosis is noted along the menisci. There are
small marginal osteophytes from all 3 compartments. A minimal joint
effusion is suggested.
IMPRESSION: 1. No fracture or acute finding.
2. Arthropathic changes. Consider CPPD arthropathy given the
chondrocalcinosis along the menisci and the pattern of joint
compartment involvement.

## 2017-03-16 ENCOUNTER — Other Ambulatory Visit: Payer: Self-pay | Admitting: Nurse Practitioner

## 2017-03-16 MED ORDER — PRIMIDONE 50 MG PO TABS: 50.0000 mg | ORAL_TABLET | Freq: Three times a day (TID) | ORAL | 0 refills | Status: DC

## 2017-04-04 ENCOUNTER — Ambulatory Visit: Payer: Self-pay | Admitting: Family Medicine

## 2017-04-13 ENCOUNTER — Ambulatory Visit (INDEPENDENT_AMBULATORY_CARE_PROVIDER_SITE_OTHER): Payer: PPO | Admitting: Family Medicine

## 2017-04-13 ENCOUNTER — Encounter: Payer: Self-pay | Admitting: Family Medicine

## 2017-04-13 ENCOUNTER — Encounter (INDEPENDENT_AMBULATORY_CARE_PROVIDER_SITE_OTHER): Payer: Self-pay

## 2017-04-13 VITALS — BP 128/82 | HR 83 | Temp 98.2°F | Ht 66.0 in | Wt 144.0 lb

## 2017-04-13 DIAGNOSIS — K5900 Constipation, unspecified: Secondary | ICD-10-CM

## 2017-04-13 DIAGNOSIS — K219 Gastro-esophageal reflux disease without esophagitis: Secondary | ICD-10-CM

## 2017-04-13 DIAGNOSIS — M170 Bilateral primary osteoarthritis of knee: Secondary | ICD-10-CM | POA: Diagnosis not present

## 2017-04-13 MED ORDER — OMEPRAZOLE 40 MG PO CPDR: 40.0000 mg | DELAYED_RELEASE_CAPSULE | Freq: Every day | ORAL | 3 refills | Status: DC

## 2017-04-13 MED ORDER — POLYETHYLENE GLYCOL 3350 17 GM/SCOOP PO POWD: 17.0000 g | Freq: Every day | ORAL | 3 refills | Status: DC

## 2017-04-13 MED ORDER — MELOXICAM 15 MG PO TABS: 15.0000 mg | ORAL_TABLET | Freq: Every day | ORAL | 3 refills | Status: DC

## 2017-04-13 NOTE — Assessment & Plan Note (Signed)
Acute on chronic. Patient with recent worsening. Patient has known osteoarthritis. Prior x-ray revealed a possible component of CPPD. Discussed treatment options: Physical therapy, anti-inflammatories, injection, referral to orthopedics. Patient elected for a trial of an anti-inflammatory. Starting on Mobic.

## 2017-04-13 NOTE — Assessment & Plan Note (Signed)
Stable on omeprazole. Refill today. 

## 2017-04-13 NOTE — Patient Instructions (Signed)
Feel free to try the supplements.  Mobic daily. Be sure to take your omeprazole.  Follow up later this year.  Take care  Dr. Lacinda Axon

## 2017-04-13 NOTE — Assessment & Plan Note (Signed)
Stable on MiraLAX. Refilled.

## 2017-04-13 NOTE — Progress Notes (Signed)
Subjective:  Patient ID: Rodney Branch, male    DOB: [birth year]  Age: 83 y.o. MRN: [id number]  CC: Knee pain, GERD, Constipation  HPI:  83 year old male with known knee OA presents with the above complaints.   Patient has a history of osseous arthritis of the knees. He states that his pain has been worsening as of late. The past 2 weeks he's had significant right knee pain. He reports instability. No trauma, fall, injury. Associated weakness. Pain in the right knee greater than left. Associated swelling. Exacerbated by activity. No known relieving factors. No other complaints or concerns at this time.  GERD is stable on omeprazole. Needs refill.  He is doing well regarding constipation with daily MiraLAX. Needs refill.  Social Hx   Social History   Social History  . Marital status: Married    Spouse name: N/A  . Number of children: 3  . Years of education: N/A   Occupational History  . Research scientist (life sciences)     Retired  . [employer]     retired also   Social History Main Topics  . Smoking status: Never Smoker  . Smokeless tobacco: Never Used  . Alcohol use 0.0 oz/week     Comment: occasonally drinks wine  . Drug use: No  . Sexual activity: Not on file   Other Topics Concern  . Not on file   Social History Narrative   Has living will   Lives with wife in [place]   Wife, then son [name], is his health care POA   Would accept resuscitation attempts but no prolonged ventilation   Would consider feeding tube   Occupation: retired, was Research scientist (life sciences)   Activity: bowling 3x/wk   Diet: good water, fruits/vegetables daily      [name] local son: [name] (and wife [name])- [phone]   [name] son in [place]: [name]    Review of Systems  Constitutional: Negative.   Musculoskeletal:       Knee pain.   Objective:  BP 128/82   Pulse 83   Temp 98.2 F (36.8 C) (Oral)   Ht 5\' 6"  (1.676 m)   Wt 144 lb (65.3 kg)   SpO2 98%   BMI 23.24 kg/m   BP/Weight  04/13/2017 11/15/2016 03/06/1447  Systolic BP 185 631 497  Diastolic BP 82 69 71  Wt. (Lbs) 144 143.8 146  BMI 23.24 21.86 22.2    Physical Exam  Constitutional: He is oriented to person, place, and time. He appears well-developed. No distress.  Cardiovascular: Normal rate and regular rhythm.   Pulmonary/Chest: Effort normal and breath sounds normal.  Musculoskeletal:  Knee: Right Inspection: Swelling noted. Palpation - no warmth. Joint line tenderness. Range of motion decreased in extension. Patient unable to fully extend knee. Ligaments with solid consistent endpoints including ACL, PCL, LCL, MCL.   Neurological: He is alert and oriented to person, place, and time.  Psychiatric: He has a normal mood and affect.  Vitals reviewed.   Lab Results  Component Value Date   WBC 3.0 (L) 03/25/2016   HGB 12.8 (L) 04/24/2015   HCT 37.2 (L) 03/25/2016   PLT 278 03/25/2016   GLUCOSE 96 03/25/2016   CHOL 185 03/25/2016   TRIG 152 (H) 03/25/2016   HDL 49 03/25/2016   LDLDIRECT 148.8 04/10/2013   LDLCALC 106 (H) 03/25/2016   ALT 12 03/25/2016   AST 15 03/25/2016   NA 144 03/25/2016   K 4.2 03/25/2016   CL 105  03/25/2016   CREATININE 0.99 03/25/2016   BUN 20 03/25/2016   CO2 22 03/25/2016   TSH 1.32 04/17/2015   PSA 1.83 03/30/2010   HGBA1C 5.5 04/10/2012   MICROALBUR 0.8 03/30/2010    Assessment & Plan:   Problem List Items Addressed This Visit    Knee osteoarthritis - Primary    Acute on chronic. Patient with recent worsening. Patient has known osteoarthritis. Prior x-ray revealed a possible component of CPPD. Discussed treatment options: Physical therapy, anti-inflammatories, injection, referral to orthopedics. Patient elected for a trial of an anti-inflammatory. Starting on Mobic.      Relevant Medications   meloxicam (MOBIC) 15 MG tablet   GERD    Stable on omeprazole. Refill today.      Relevant Medications   omeprazole (PRILOSEC) 40 MG capsule   polyethylene  glycol powder (GLYCOLAX/MIRALAX) powder   Constipation    Stable on MiraLAX. Refilled.      Relevant Medications   polyethylene glycol powder (GLYCOLAX/MIRALAX) powder      Meds ordered this encounter  Medications  . DISCONTD: Aspirin-Caffeine (ANACIN PO)    Sig: Take by mouth.  [name] omeprazole (PRILOSEC) 40 MG capsule    Sig: Take 1 capsule (40 mg total) by mouth daily.    Dispense:  90 capsule    Refill:  3  . polyethylene glycol powder (GLYCOLAX/MIRALAX) powder    Sig: Take 17 g by mouth daily. Option for 2nd 17g dose if persistent constipation    Dispense:  3350 g    Refill:  3  . meloxicam (MOBIC) 15 MG tablet    Sig: Take 1 tablet (15 mg total) by mouth daily.    Dispense:  30 tablet    Refill:  3   Follow-up: Later this year.   [place]

## 2017-04-20 ENCOUNTER — Ambulatory Visit: Payer: PPO | Admitting: Family Medicine

## 2017-05-26 DIAGNOSIS — F332 Major depressive disorder, recurrent severe without psychotic features: Secondary | ICD-10-CM | POA: Diagnosis not present

## 2017-08-17 DIAGNOSIS — Z85820 Personal history of malignant melanoma of skin: Secondary | ICD-10-CM | POA: Diagnosis not present

## 2017-08-17 DIAGNOSIS — L720 Epidermal cyst: Secondary | ICD-10-CM | POA: Diagnosis not present

## 2017-08-17 DIAGNOSIS — B009 Herpesviral infection, unspecified: Secondary | ICD-10-CM | POA: Diagnosis not present

## 2017-08-17 DIAGNOSIS — D485 Neoplasm of uncertain behavior of skin: Secondary | ICD-10-CM | POA: Diagnosis not present

## 2017-08-17 DIAGNOSIS — L578 Other skin changes due to chronic exposure to nonionizing radiation: Secondary | ICD-10-CM | POA: Diagnosis not present

## 2017-08-17 DIAGNOSIS — Z1283 Encounter for screening for malignant neoplasm of skin: Secondary | ICD-10-CM | POA: Diagnosis not present

## 2017-08-17 DIAGNOSIS — L2389 Allergic contact dermatitis due to other agents: Secondary | ICD-10-CM | POA: Diagnosis not present

## 2017-09-06 ENCOUNTER — Other Ambulatory Visit: Payer: Self-pay

## 2017-09-06 MED ORDER — OMEPRAZOLE 40 MG PO CPDR: 40.0000 mg | DELAYED_RELEASE_CAPSULE | Freq: Every day | ORAL | 0 refills | Status: DC

## 2017-11-10 DIAGNOSIS — F332 Major depressive disorder, recurrent severe without psychotic features: Secondary | ICD-10-CM | POA: Diagnosis not present

## 2018-01-15 ENCOUNTER — Ambulatory Visit (INDEPENDENT_AMBULATORY_CARE_PROVIDER_SITE_OTHER): Payer: PPO

## 2018-01-15 VITALS — BP 126/76 | HR 71 | Temp 98.6°F | Resp 14 | Ht 66.5 in | Wt 144.8 lb

## 2018-01-15 DIAGNOSIS — Z0000 Encounter for general adult medical examination without abnormal findings: Secondary | ICD-10-CM
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
## 2018-01-15 NOTE — Patient Instructions (Addendum)
  Mr. Rodney Branch , Thank you for taking time to come for your Medicare Wellness Visit. I appreciate your ongoing commitment to your health goals. Please review the following plan we discussed and let me know if I can assist you in the future.   Follow up with Dr. Caryl Bis as needed.    Bring a copy of your [place] and/or Living Will to be scanned into chart.  Have a great day!  These are the goals we discussed: Goals    . Maintain Healthy Lifestyle     Increase water intake Low carb foods Exercise       This is a list of the screening recommended for you and due dates:  Health Maintenance  Topic Date Due  . Tetanus Vaccine  09/07/2027  . Flu Shot  Completed  . Pneumonia vaccines  Completed

## 2018-01-22 ENCOUNTER — Ambulatory Visit: Payer: PPO | Admitting: Family Medicine

## 2018-01-31 ENCOUNTER — Ambulatory Visit (INDEPENDENT_AMBULATORY_CARE_PROVIDER_SITE_OTHER): Payer: PPO | Admitting: Family Medicine

## 2018-01-31 ENCOUNTER — Encounter: Payer: Self-pay | Admitting: Family Medicine

## 2018-01-31 DIAGNOSIS — K219 Gastro-esophageal reflux disease without esophagitis: Secondary | ICD-10-CM

## 2018-01-31 DIAGNOSIS — G250 Essential tremor: Secondary | ICD-10-CM

## 2018-01-31 DIAGNOSIS — N400 Enlarged prostate without lower urinary tract symptoms: Secondary | ICD-10-CM

## 2018-01-31 DIAGNOSIS — Z85820 Personal history of malignant melanoma of skin: Secondary | ICD-10-CM | POA: Diagnosis not present

## 2018-01-31 MED ORDER — DOXAZOSIN MESYLATE 8 MG PO TABS: 8.0000 mg | ORAL_TABLET | Freq: Every day | ORAL | 3 refills | Status: DC

## 2018-01-31 MED ORDER — OMEPRAZOLE 40 MG PO CPDR: 40.0000 mg | DELAYED_RELEASE_CAPSULE | Freq: Every day | ORAL | 3 refills | Status: DC

## 2018-01-31 MED ORDER — PRIMIDONE 50 MG PO TABS: 50.0000 mg | ORAL_TABLET | Freq: Three times a day (TID) | ORAL | 5 refills | Status: DC

## 2018-01-31 NOTE — Assessment & Plan Note (Signed)
Reports no recurrence.  He continues to follow with dermatology.

## 2018-01-31 NOTE — Patient Instructions (Signed)
Nice to see you. I have refilled your medications. I will see you back in 6 months. 

## 2018-01-31 NOTE — Assessment & Plan Note (Signed)
Stable on current dosing of primidone.  Refill given.

## 2018-01-31 NOTE — Assessment & Plan Note (Signed)
Stable on omeprazole.  Refill given.

## 2018-01-31 NOTE — Progress Notes (Signed)
  [name], MD Phone: [phone]  Rodney Branch is a 84 y.o. male who presents today for f/u.  BPH: Has been on doxazosin with good benefit.  He notes no urinary stream issues.  No starting or stopping.  He does empty his bladder fully.  No nocturia.  Benign essential tremor: He has been on primidone and been stable for some time now.  Minimal tremor at times.  No neurological deficits on exam.  His father had similar issues.  GERD: Takes Prilosec daily.  No symptoms as long as he takes this.  No abdominal pain or blood in his stool.  History of melanoma: Follows twice yearly with dermatology.  He has had no recurrence.  He sees them later this month.  Social History   Tobacco Use  Smoking Status Never Smoker  Smokeless Tobacco Never Used     ROS see history of present illness  Objective  Physical Exam Vitals:   01/31/18 1333  BP: 122/80  Pulse: 97  Temp: 98.1 F (36.7 C)  SpO2: 96%    BP Readings from Last 3 Encounters:  01/31/18 122/80  01/15/18 126/76  04/13/17 128/82   Wt Readings from Last 3 Encounters:  01/31/18 147 lb 12.8 oz (67 kg)  01/15/18 144 lb 12.8 oz (65.7 kg)  04/13/17 144 lb (65.3 kg)    Physical Exam  Constitutional: No distress.  Cardiovascular: Normal rate, regular rhythm and normal heart sounds.  Pulmonary/Chest: Effort normal and breath sounds normal.  Musculoskeletal: He exhibits no edema.  Neurological: He is alert. Gait normal.  CN 2-12 intact, 5/5 strength in bilateral biceps, triceps, grip, quads, hamstrings, plantar and dorsiflexion, sensation to light touch intact in bilateral UE and LE, minimal intention tremor  Skin: Skin is warm and dry. He is not diaphoretic.     Assessment/Plan: Please see individual problem list.  GERD Stable on omeprazole.  Refill given.  Benign familial tremor Stable on current dosing of primidone.  Refill given.  BPH without obstruction/lower urinary tract symptoms Currently asymptomatic.   Refill of doxazosin given.  Patient declined lab work and rectal exam today.  History of melanoma Reports no recurrence.  He continues to follow with dermatology.   No orders of the defined types were placed in this encounter.   Meds ordered this encounter  Medications  . doxazosin (CARDURA) 8 MG tablet    Sig: Take 1 tablet (8 mg total) by mouth daily.    Dispense:  90 tablet    Refill:  3  . primidone (MYSOLINE) 50 MG tablet    Sig: Take 1 tablet (50 mg total) by mouth 3 (three) times daily.    Dispense:  90 tablet    Refill:  5  . omeprazole (PRILOSEC) 40 MG capsule    Sig: Take 1 capsule (40 mg total) by mouth daily.    Dispense:  90 capsule    Refill:  [place], MD [place]

## 2018-01-31 NOTE — Assessment & Plan Note (Addendum)
Currently asymptomatic.  Refill of doxazosin given.  Patient declined lab work and rectal exam today.

## 2018-02-15 DIAGNOSIS — L905 Scar conditions and fibrosis of skin: Secondary | ICD-10-CM | POA: Diagnosis not present

## 2018-02-15 DIAGNOSIS — L814 Other melanin hyperpigmentation: Secondary | ICD-10-CM | POA: Diagnosis not present

## 2018-02-15 DIAGNOSIS — L578 Other skin changes due to chronic exposure to nonionizing radiation: Secondary | ICD-10-CM | POA: Diagnosis not present

## 2018-02-15 DIAGNOSIS — D229 Melanocytic nevi, unspecified: Secondary | ICD-10-CM | POA: Diagnosis not present

## 2018-02-15 DIAGNOSIS — Z1283 Encounter for screening for malignant neoplasm of skin: Secondary | ICD-10-CM | POA: Diagnosis not present

## 2018-02-15 DIAGNOSIS — L819 Disorder of pigmentation, unspecified: Secondary | ICD-10-CM | POA: Diagnosis not present

## 2018-02-15 DIAGNOSIS — L821 Other seborrheic keratosis: Secondary | ICD-10-CM | POA: Diagnosis not present

## 2018-02-15 DIAGNOSIS — D225 Melanocytic nevi of trunk: Secondary | ICD-10-CM | POA: Diagnosis not present

## 2018-02-15 DIAGNOSIS — D1800 Hemangioma unspecified site: Secondary | ICD-10-CM | POA: Diagnosis not present

## 2018-02-15 DIAGNOSIS — D485 Neoplasm of uncertain behavior of skin: Secondary | ICD-10-CM | POA: Diagnosis not present

## 2018-02-15 DIAGNOSIS — Z85820 Personal history of malignant melanoma of skin: Secondary | ICD-10-CM | POA: Diagnosis not present

## 2018-05-15 DIAGNOSIS — D225 Melanocytic nevi of trunk: Secondary | ICD-10-CM | POA: Diagnosis not present

## 2018-05-15 DIAGNOSIS — L988 Other specified disorders of the skin and subcutaneous tissue: Secondary | ICD-10-CM | POA: Diagnosis not present

## 2018-05-22 DIAGNOSIS — F332 Major depressive disorder, recurrent severe without psychotic features: Secondary | ICD-10-CM | POA: Diagnosis not present

## 2018-05-22 DIAGNOSIS — D225 Melanocytic nevi of trunk: Secondary | ICD-10-CM | POA: Diagnosis not present

## 2018-08-29 ENCOUNTER — Emergency Department
Admission: EM | Admit: 2018-08-29 | Discharge: 2018-08-29 | Disposition: A | Discharge status: Home / Self Care | Payer: PPO | Attending: Emergency Medicine | Admitting: Emergency Medicine

## 2018-08-29 ENCOUNTER — Other Ambulatory Visit: Payer: Self-pay

## 2018-08-29 ENCOUNTER — Encounter: Payer: Self-pay | Admitting: Emergency Medicine

## 2018-08-29 ENCOUNTER — Emergency Department: Payer: PPO

## 2018-08-29 DIAGNOSIS — S199XXA Unspecified injury of neck, initial encounter: Secondary | ICD-10-CM | POA: Diagnosis not present

## 2018-08-29 DIAGNOSIS — S0083XA Contusion of other part of head, initial encounter: Secondary | ICD-10-CM | POA: Diagnosis not present

## 2018-08-29 DIAGNOSIS — Z85828 Personal history of other malignant neoplasm of skin: Secondary | ICD-10-CM | POA: Diagnosis not present

## 2018-08-29 DIAGNOSIS — S0990XA Unspecified injury of head, initial encounter: Secondary | ICD-10-CM | POA: Diagnosis not present

## 2018-08-29 DIAGNOSIS — Z79899 Other long term (current) drug therapy: Secondary | ICD-10-CM | POA: Diagnosis not present

## 2018-08-29 DIAGNOSIS — S022XXB Fracture of nasal bones, initial encounter for open fracture: Secondary | ICD-10-CM

## 2018-08-29 DIAGNOSIS — W01198A Fall on same level from slipping, tripping and stumbling with subsequent striking against other object, initial encounter: Secondary | ICD-10-CM | POA: Diagnosis not present

## 2018-08-29 DIAGNOSIS — Y9389 Activity, other specified: Secondary | ICD-10-CM | POA: Diagnosis not present

## 2018-08-29 DIAGNOSIS — I10 Essential (primary) hypertension: Secondary | ICD-10-CM | POA: Insufficient documentation

## 2018-08-29 DIAGNOSIS — S0121XA Laceration without foreign body of nose, initial encounter: Secondary | ICD-10-CM | POA: Insufficient documentation

## 2018-08-29 DIAGNOSIS — F329 Major depressive disorder, single episode, unspecified: Secondary | ICD-10-CM | POA: Diagnosis not present

## 2018-08-29 DIAGNOSIS — Z23 Encounter for immunization: Secondary | ICD-10-CM | POA: Insufficient documentation

## 2018-08-29 DIAGNOSIS — Y998 Other external cause status: Secondary | ICD-10-CM | POA: Insufficient documentation

## 2018-08-29 DIAGNOSIS — Y92008 Other place in unspecified non-institutional (private) residence as the place of occurrence of the external cause: Secondary | ICD-10-CM | POA: Diagnosis not present

## 2018-08-29 DIAGNOSIS — S022XXA Fracture of nasal bones, initial encounter for closed fracture: Secondary | ICD-10-CM | POA: Insufficient documentation

## 2018-08-29 DIAGNOSIS — S0993XA Unspecified injury of face, initial encounter: Secondary | ICD-10-CM | POA: Diagnosis present

## 2018-08-29 DIAGNOSIS — W19XXXA Unspecified fall, initial encounter: Secondary | ICD-10-CM

## 2018-08-29 MED ORDER — LIDOCAINE HCL (PF) 1 % IJ SOLN
10.0000 mL | Freq: Once | INTRAMUSCULAR | Status: AC
  Administered 2018-08-29: 10 mL
  Filled 2018-08-29: qty 10

## 2018-08-29 MED ORDER — TETANUS-DIPHTH-ACELL PERTUSSIS 5-2.5-18.5 LF-MCG/0.5 IM SUSP
0.5000 mL | Freq: Once | INTRAMUSCULAR | Status: AC
  Administered 2018-08-29: 0.5 mL via INTRAMUSCULAR
  Filled 2018-08-29: qty 0.5

## 2018-08-29 MED ORDER — CEPHALEXIN 500 MG PO CAPS: 1000.0000 mg | ORAL_CAPSULE | Freq: Two times a day (BID) | ORAL | 0 refills | Status: DC

## 2018-08-29 MED ORDER — AMOXICILLIN-POT CLAVULANATE 875-125 MG PO TABS: 1.0000 | ORAL_TABLET | Freq: Two times a day (BID) | ORAL | 0 refills | Status: DC

## 2018-08-29 MED ORDER — HYDROCODONE-ACETAMINOPHEN 5-325 MG PO TABS: 1.0000 | ORAL_TABLET | ORAL | 0 refills | Status: DC | PRN

## 2018-08-29 NOTE — ED Notes (Signed)
Per Roderic Palau, PA-pt acuity to be made a level 2.
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
## 2018-08-29 NOTE — ED Triage Notes (Signed)
Pt in via POV, reports tripping over a box, landing face forward on concrete.  Pt with laceration to bridge of nose and left cheek.  Pt denies blood thinners, denies LOC.  Pt ambulatory to triage.  NAD noted at this time.

## 2018-08-29 NOTE — ED Notes (Signed)
See triage note  States he tripped fell face first  Abrasions noted to both hands and left cheek  Laceration noted to bridge of nose

## 2018-09-05 ENCOUNTER — Telehealth: Payer: Self-pay | Admitting: Family Medicine

## 2018-09-05 DIAGNOSIS — H43813 Vitreous degeneration, bilateral: Secondary | ICD-10-CM | POA: Diagnosis not present

## 2018-09-05 DIAGNOSIS — S022XXB Fracture of nasal bones, initial encounter for open fracture: Secondary | ICD-10-CM | POA: Diagnosis not present

## 2018-09-05 DIAGNOSIS — J342 Deviated nasal septum: Secondary | ICD-10-CM | POA: Diagnosis not present

## 2018-09-05 NOTE — Telephone Encounter (Signed)
Copied from [place] [id number]. Topic: Referral - Request >> Sep 05, 2018  2:10 PM Ivar Drape wrote: Reason for CRM:   Patient spouse, Rodney Branch, stated that the patient needs a referral to a Gastroenterologist because the patient has had diarrhea for a couples of days now.  He has not had any cramps, but every time he eats it comes right back in the form of diarrhea.  They would like to see Dr. Chandra Batch, [phone]

## 2018-09-06 NOTE — Telephone Encounter (Signed)
FYI

## 2018-09-06 NOTE — Telephone Encounter (Signed)
Called pt an dleft a VM to call back to schedule for an appointment per quest by PCP. CRM created and sent to [facility] pool.

## 2018-09-06 NOTE — Telephone Encounter (Signed)
Patient is feeling better he states he doesn't need referral

## 2018-09-06 NOTE — Telephone Encounter (Signed)
Sent to PCP to advise  Would you like patient to come in for an OV to see if this is something your could address first w/o the referral?

## 2018-09-06 NOTE — Telephone Encounter (Signed)
He should be seen in the office before getting a referral.  Please offer him an appointment with myself or Lauren for evaluation.  Thanks.

## 2018-09-12 DIAGNOSIS — D485 Neoplasm of uncertain behavior of skin: Secondary | ICD-10-CM | POA: Diagnosis not present

## 2018-09-12 DIAGNOSIS — D2230 Melanocytic nevi of unspecified part of face: Secondary | ICD-10-CM | POA: Diagnosis not present

## 2018-09-12 DIAGNOSIS — Z85820 Personal history of malignant melanoma of skin: Secondary | ICD-10-CM | POA: Diagnosis not present

## 2018-09-12 DIAGNOSIS — L821 Other seborrheic keratosis: Secondary | ICD-10-CM | POA: Diagnosis not present

## 2018-09-12 DIAGNOSIS — L812 Freckles: Secondary | ICD-10-CM | POA: Diagnosis not present

## 2018-09-12 DIAGNOSIS — Z1283 Encounter for screening for malignant neoplasm of skin: Secondary | ICD-10-CM | POA: Diagnosis not present

## 2018-09-12 DIAGNOSIS — D1800 Hemangioma unspecified site: Secondary | ICD-10-CM | POA: Diagnosis not present

## 2018-09-12 DIAGNOSIS — L578 Other skin changes due to chronic exposure to nonionizing radiation: Secondary | ICD-10-CM | POA: Diagnosis not present

## 2018-09-12 DIAGNOSIS — D229 Melanocytic nevi, unspecified: Secondary | ICD-10-CM | POA: Diagnosis not present

## 2018-09-12 DIAGNOSIS — D225 Melanocytic nevi of trunk: Secondary | ICD-10-CM | POA: Diagnosis not present

## 2018-10-19 ENCOUNTER — Encounter: Payer: Self-pay | Admitting: Emergency Medicine

## 2018-10-19 DIAGNOSIS — R251 Tremor, unspecified: Secondary | ICD-10-CM

## 2018-11-06 ENCOUNTER — Encounter: Payer: Self-pay | Admitting: Psychiatry

## 2018-11-06 ENCOUNTER — Ambulatory Visit: Payer: PPO | Admitting: Psychiatry

## 2018-11-06 DIAGNOSIS — F319 Bipolar disorder, unspecified: Secondary | ICD-10-CM | POA: Diagnosis not present

## 2018-11-06 NOTE — Patient Instructions (Signed)
Try 1 1/2 tablets at bedtime and then 1/2 tablet if you awaken too early  To see if you are less drowsy in the morning.
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
## 2018-11-08 ENCOUNTER — Encounter: Payer: Self-pay | Admitting: Emergency Medicine

## 2018-12-02 ENCOUNTER — Other Ambulatory Visit: Payer: Self-pay | Admitting: Psychiatry

## 2018-12-07 ENCOUNTER — Encounter

## 2018-12-07 ENCOUNTER — Ambulatory Visit: Payer: PPO | Admitting: Family Medicine

## 2019-01-16 ENCOUNTER — Ambulatory Visit: Payer: PPO

## 2019-02-19 ENCOUNTER — Other Ambulatory Visit: Payer: Self-pay | Admitting: Family Medicine

## 2019-02-19 DIAGNOSIS — N400 Enlarged prostate without lower urinary tract symptoms: Secondary | ICD-10-CM

## 2019-02-19 NOTE — Telephone Encounter (Signed)
Patient has not been seen in 1 year.  Please contact him as he will need follow-up.  Once he schedules follow-up I will refill for a short period of time.  Thanks.

## 2019-02-20 NOTE — Telephone Encounter (Signed)
Noted. 30 day supply sent to pharmacy.

## 2019-02-25 ENCOUNTER — Ambulatory Visit: Payer: PPO | Admitting: Family Medicine

## 2019-03-05 ENCOUNTER — Other Ambulatory Visit: Payer: Self-pay | Admitting: Psychiatry

## 2019-03-07 ENCOUNTER — Other Ambulatory Visit: Payer: Self-pay | Admitting: Psychiatry

## 2019-03-21 ENCOUNTER — Other Ambulatory Visit: Payer: Self-pay | Admitting: Family Medicine

## 2019-03-21 DIAGNOSIS — N400 Enlarged prostate without lower urinary tract symptoms: Secondary | ICD-10-CM

## 2019-03-21 NOTE — Telephone Encounter (Signed)
I will only refill medications for 30 days if the patient has not been seen in the past 12 months. I am happy to send in a 90 day supply once he has completed his visit.

## 2019-03-22 NOTE — Telephone Encounter (Signed)
Pt called in to follow up on refill request. Explained to pt per PCP note. Pt expressed understanding. Pt would like to go ahead with 30 day because he is running low on his med.

## 2019-03-22 NOTE — Telephone Encounter (Signed)
Left message on voicemail stating he can get more refills on his medications after his office visit on 04/02/19.  Holten Spano,cma

## 2019-03-23 NOTE — Telephone Encounter (Signed)
Sent to pharmacy 

## 2019-04-02 ENCOUNTER — Ambulatory Visit (INDEPENDENT_AMBULATORY_CARE_PROVIDER_SITE_OTHER): Payer: PPO | Admitting: Family Medicine

## 2019-04-02 ENCOUNTER — Encounter: Payer: Self-pay | Admitting: Family Medicine

## 2019-04-02 ENCOUNTER — Other Ambulatory Visit: Payer: Self-pay

## 2019-04-02 DIAGNOSIS — K5901 Slow transit constipation: Secondary | ICD-10-CM | POA: Diagnosis not present

## 2019-04-02 DIAGNOSIS — K219 Gastro-esophageal reflux disease without esophagitis: Secondary | ICD-10-CM

## 2019-04-02 DIAGNOSIS — Z85820 Personal history of malignant melanoma of skin: Secondary | ICD-10-CM

## 2019-04-02 DIAGNOSIS — K5900 Constipation, unspecified: Secondary | ICD-10-CM

## 2019-04-02 DIAGNOSIS — D649 Anemia, unspecified: Secondary | ICD-10-CM

## 2019-04-02 DIAGNOSIS — N400 Enlarged prostate without lower urinary tract symptoms: Secondary | ICD-10-CM

## 2019-04-02 MED ORDER — POLYETHYLENE GLYCOL 3350 17 GM/SCOOP PO POWD: 17.0000 g | Freq: Every day | ORAL | 1 refills | Status: DC

## 2019-04-02 MED ORDER — DOXAZOSIN MESYLATE 8 MG PO TABS: 8.0000 mg | ORAL_TABLET | Freq: Every day | ORAL | 1 refills | Status: DC

## 2019-04-02 MED ORDER — OMEPRAZOLE 40 MG PO CPDR: DELAYED_RELEASE_CAPSULE | ORAL | 0 refills | Status: DC

## 2019-04-02 NOTE — Assessment & Plan Note (Signed)
Asymptomatic.  Refill doxazosin.

## 2019-04-02 NOTE — Progress Notes (Signed)
Pt is requesting WRITTEN RX only   90 day supply for amoxicillin to treat enlarge prostate and Glycolaz/miralax for constipation

## 2019-04-02 NOTE — Assessment & Plan Note (Signed)
Repeat CBC

## 2019-04-02 NOTE — Patient Instructions (Signed)
Nice to see you. Please start taking your omeprazole every other day.  You will do this for 1 month and then discontinue this.  If you have recurrence of your symptoms please let us know.

## 2019-04-02 NOTE — Assessment & Plan Note (Signed)
We will try to taper him off of his omeprazole.  He will take this every other day for 1 month and then discontinue.  If he has recurrence of symptoms at any point he can restart the medication.

## 2019-04-02 NOTE — Progress Notes (Signed)
[name], MD Phone: [phone]  Rodney Branch is a 85 y.o. male who presents today for f/u.  Constipation: Patient takes MiraLAX every 3 days and has a bowel movement every 2 days.  Notes no abdominal pain or blood in stool.  No straining.  BPH: Patient takes doxazosin.  He has been taking this since he was 22.  If he misses doses he will have discomfort with urination.  If he takes it every day he has no discomfort.  No frequency.  No urgency.  No nocturia.  He does feel he empties well.  GERD: Taking omeprazole daily for the last 9 years.  No reflux symptoms abdominal pain, dysphagia, or blood in stool.  History of melanoma: He notes no new skin lesions.  Follows with dermatology every 6 months.  It has been several years since he has had lab work and we will repeat some today.  He appeared to have been mildly anemic previously.  Social History   Tobacco Use  Smoking Status Never Smoker  Smokeless Tobacco Never Used     ROS see history of present illness  Objective  Physical Exam Vitals:   04/02/19 1434  BP: 120/80  Pulse: 80  Temp: (!) 97.3 F (36.3 C)  SpO2: 99%    BP Readings from Last 3 Encounters:  04/02/19 120/80  08/29/18 (!) 174/101  01/31/18 122/80   Wt Readings from Last 3 Encounters:  04/02/19 144 lb 3.2 oz (65.4 kg)  08/29/18 146 lb (66.2 kg)  01/31/18 147 lb 12.8 oz (67 kg)    Physical Exam Constitutional:      General: He is not in acute distress.    Appearance: He is not diaphoretic.  Cardiovascular:     Rate and Rhythm: Normal rate and regular rhythm.     Heart sounds: Normal heart sounds.  Pulmonary:     Effort: Pulmonary effort is normal.     Breath sounds: Normal breath sounds.  Abdominal:     General: Bowel sounds are normal. There is no distension.     Palpations: Abdomen is soft.     Tenderness: There is no abdominal tenderness. There is no guarding or rebound.  Skin:    General: Skin is warm and dry.  Neurological:     Mental Status: He is alert.      Assessment/Plan: Please see individual problem list.  GERD We will try to taper him off of his omeprazole.  He will take this every other day for 1 month and then discontinue.  If he has recurrence of symptoms at any point he can restart the medication.  BPH without obstruction/lower urinary tract symptoms Asymptomatic.  Refill doxazosin.  Constipation Continue MiraLAX.  History of melanoma He will continue to follow with dermatology.  Normocytic anemia Repeat CBC.   Orders Placed This Encounter  Procedures  . Comp Met (CMET)  . CBC    Meds ordered this encounter  Medications  . doxazosin (CARDURA) 8 MG tablet    Sig: Take 1 tablet (8 mg total) by mouth daily.    Dispense:  90 tablet    Refill:  1  . polyethylene glycol powder (GLYCOLAX/MIRALAX) 17 GM/SCOOP powder    Sig: Take 17 g by mouth daily.    Dispense:  3350 g    Refill:  1  . omeprazole (PRILOSEC) 40 MG capsule    Sig: Take 1 tablet every other day for 1 month and then discontinue.    Dispense:  30 capsule  Refill:  0     [name], MD [place]

## 2019-04-02 NOTE — Assessment & Plan Note (Signed)
He will continue to follow with dermatology. 

## 2019-04-02 NOTE — Assessment & Plan Note (Signed)
Need to get a bowel movement going.  Continue MiraLAX twice daily.  Will give a dose of mag citrate. 

## 2019-04-03 LAB — CBC
HCT: 38.4 % — ABNORMAL LOW (ref 39.0–52.0)
Hemoglobin: 12.9 g/dL — ABNORMAL LOW (ref 13.0–17.0)
MCHC: 33.5 g/dL (ref 30.0–36.0)
MCV: 92.4 fl (ref 78.0–100.0)
Platelets: 224 10*3/uL (ref 150.0–400.0)
RBC: 4.16 Mil/uL — ABNORMAL LOW (ref 4.22–5.81)
RDW: 13.8 % (ref 11.5–15.5)
WBC: 3.8 10*3/uL — ABNORMAL LOW (ref 4.0–10.5)

## 2019-04-03 LAB — COMPREHENSIVE METABOLIC PANEL
ALT: 11 U/L (ref 0–53)
AST: 16 U/L (ref 0–37)
Albumin: 4.2 g/dL (ref 3.5–5.2)
Alkaline Phosphatase: 111 U/L (ref 39–117)
BUN: 19 mg/dL (ref 6–23)
CO2: 25 mEq/L (ref 19–32)
Calcium: 9.1 mg/dL (ref 8.4–10.5)
Chloride: 102 mEq/L (ref 96–112)
Creatinine, Ser: 1.15 mg/dL (ref 0.40–1.50)
GFR: 60.3 mL/min (ref >60.00)
Glucose, Bld: 78 mg/dL (ref 70–99)
Potassium: 4.3 mEq/L (ref 3.5–5.1)
Sodium: 137 mEq/L (ref 135–145)
Total Bilirubin: 0.4 mg/dL (ref 0.2–1.2)
Total Protein: 7 g/dL (ref 6.0–8.3)

## 2019-04-07 ENCOUNTER — Other Ambulatory Visit: Payer: Self-pay | Admitting: Family Medicine

## 2019-04-07 DIAGNOSIS — D72819 Decreased white blood cell count, unspecified: Secondary | ICD-10-CM

## 2019-04-08 ENCOUNTER — Telehealth: Payer: Self-pay | Admitting: Family Medicine

## 2019-04-08 NOTE — Telephone Encounter (Signed)
Pt wanted to know what the long term effects of being on this medication can do to his health and why he needs to come off the medication?  Pt stated that when he does not take it his acid reflux is unbearable.   If he can't take omeprazole what else could he take to help with his acid reflux symptoms?

## 2019-04-08 NOTE — Telephone Encounter (Signed)
Copied from [place] [id number]. Topic: Quick Communication - Rx Refill/Question >> Apr 08, 2019 10:02 AM Robina Ade, Helene Kelp D wrote: Medication: omeprazole (PRILOSEC) 40 MG capsule  Has the patient contacted their pharmacy? No, patient stated that the way med was instructed is not working. He was told he needs to take it every other day but its not working due to when he doesn't take it his gerd comes back. Patient would like a call back. (Agent: If no, request that the patient contact the pharmacy for the refill.) (Agent: If yes, when and what did the pharmacy advise?)  Preferred Pharmacy (with phone number or street name): [place], [place]  Agent: Please be advised that RX refills may take up to 3 business days. We ask that you follow-up with your pharmacy.

## 2019-04-08 NOTE — Telephone Encounter (Signed)
He can remain on the omeprazole if his reflux is uncontrolled when he comes off of it.  The risk of long-term use with this medication is an increased risk of infection, decreased absorption of calcium, and increased risk of fractures and kidney issues.

## 2019-04-09 ENCOUNTER — Telehealth: Payer: Self-pay | Admitting: *Deleted

## 2019-04-09 NOTE — Telephone Encounter (Signed)
Patient called back to get an update on the conversation from previously with the Prilosec asking for a call back please at   Ph# [phone]

## 2019-04-09 NOTE — Telephone Encounter (Signed)
Copied from [place] [id number]. Topic: Quick Communication - Rx Refill/Question >> Apr 08, 2019 10:02 AM Robina Ade, Helene Kelp D wrote: Medication: omeprazole (PRILOSEC) 40 MG capsule  Has the patient contacted their pharmacy? No, patient stated that the way med was instructed is not working. He was told he needs to take it every other day but its not working due to when he doesn't take it his gerd comes back. Patient would like a call back. (Agent: If no, request that the patient contact the pharmacy for the refill.) (Agent: If yes, when and what did the pharmacy advise?)  Preferred Pharmacy (with phone number or street name): [facility], [place]  Agent: Please be advised that RX refills may take up to 3 business days. We ask that you follow-up with your pharmacy.

## 2019-04-09 NOTE — Telephone Encounter (Signed)
Pt called [facility] Pt Patient stated that the way med was instructed is not working. He was told he needs to take it every other day but its not working due to when he doesn't take it his gerd comes back. Patient would like a call back.

## 2019-04-09 NOTE — Telephone Encounter (Signed)
Patient can go back to taking the medication daily.

## 2019-04-10 NOTE — Telephone Encounter (Signed)
Pt called me back and I was able to tell him that he can go back to taking his medication daily again. Pt stated he understood with no questions.

## 2019-04-10 NOTE — Telephone Encounter (Signed)
Noted. Thanks for trying again later.

## 2019-04-10 NOTE — Telephone Encounter (Signed)
Called Pt No answer left a VM, Will try back later

## 2019-04-15 NOTE — Telephone Encounter (Signed)
Called and spoke with pt. Pt had questions about her testing positive for the COVID-19. Pt stated that the health department calls her everyday to check on her and to check on her symptoms and advise when she can return to work and no longer be quarantine.   Pt also asked how often she should use her inhaler pt advised she can use it every 6 hours for wheezing or SOB as needed.    Pt advised and voiced understanding.

## 2019-05-03 ENCOUNTER — Other Ambulatory Visit (INDEPENDENT_AMBULATORY_CARE_PROVIDER_SITE_OTHER): Payer: PPO

## 2019-05-03 ENCOUNTER — Other Ambulatory Visit: Payer: Self-pay

## 2019-05-03 DIAGNOSIS — D72819 Decreased white blood cell count, unspecified: Secondary | ICD-10-CM

## 2019-05-03 LAB — CBC WITH DIFFERENTIAL/PLATELET
Basophils Absolute: 0 10*3/uL (ref 0.0–0.1)
Basophils Relative: 0.8 % (ref 0.0–3.0)
Eosinophils Absolute: 0.1 10*3/uL (ref 0.0–0.7)
Eosinophils Relative: 2.8 % (ref 0.0–5.0)
HCT: 36.3 % — ABNORMAL LOW (ref 39.0–52.0)
Hemoglobin: 12.3 g/dL — ABNORMAL LOW (ref 13.0–17.0)
Lymphocytes Relative: 25 % (ref 12.0–46.0)
Lymphs Abs: 0.8 10*3/uL (ref 0.7–4.0)
MCHC: 33.8 g/dL (ref 30.0–36.0)
MCV: 92.4 fl (ref 78.0–100.0)
Monocytes Absolute: 0.3 10*3/uL (ref 0.1–1.0)
Monocytes Relative: 9.8 % (ref 3.0–12.0)
Neutro Abs: 1.9 10*3/uL (ref 1.4–7.7)
Neutrophils Relative %: 61.6 % (ref 43.0–77.0)
Platelets: 231 10*3/uL (ref 150.0–400.0)
RBC: 3.94 Mil/uL — ABNORMAL LOW (ref 4.22–5.81)
RDW: 13.6 % (ref 11.5–15.5)
WBC: 3.2 10*3/uL — ABNORMAL LOW (ref 4.0–10.5)

## 2019-05-06 ENCOUNTER — Ambulatory Visit: Payer: PPO | Admitting: Psychiatry

## 2019-05-06 ENCOUNTER — Other Ambulatory Visit: Payer: Self-pay

## 2019-05-06 ENCOUNTER — Encounter: Payer: Self-pay | Admitting: Psychiatry

## 2019-05-06 DIAGNOSIS — G250 Essential tremor: Secondary | ICD-10-CM

## 2019-05-06 DIAGNOSIS — F5105 Insomnia due to other mental disorder: Secondary | ICD-10-CM | POA: Diagnosis not present

## 2019-05-06 DIAGNOSIS — F319 Bipolar disorder, unspecified: Secondary | ICD-10-CM

## 2019-05-06 MED ORDER — PRIMIDONE 50 MG PO TABS: 150.0000 mg | ORAL_TABLET | Freq: Every day | ORAL | 1 refills | Status: DC

## 2019-05-06 MED ORDER — PAROXETINE HCL 40 MG PO TABS: 40.0000 mg | ORAL_TABLET | Freq: Every morning | ORAL | 1 refills | Status: DC

## 2019-05-06 MED ORDER — QUETIAPINE FUMARATE 400 MG PO TABS: 800.0000 mg | ORAL_TABLET | Freq: Every day | ORAL | 1 refills | Status: DC
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
## 2019-05-06 NOTE — Patient Instructions (Signed)
Vitamin D at least 4000 units daily.  Zinc is 50 mg twice with food.  This helps reduce Covid risk.

## 2019-05-09 ENCOUNTER — Other Ambulatory Visit: Payer: Self-pay | Admitting: Family Medicine

## 2019-05-09 DIAGNOSIS — D72819 Decreased white blood cell count, unspecified: Secondary | ICD-10-CM

## 2019-05-10 ENCOUNTER — Telehealth: Payer: Self-pay

## 2019-05-10 NOTE — Telephone Encounter (Signed)
Please see crm

## 2019-05-16 ENCOUNTER — Encounter: Payer: Self-pay | Admitting: Oncology

## 2019-05-16 ENCOUNTER — Inpatient Hospital Stay: Payer: PPO | Attending: Oncology | Admitting: Oncology

## 2019-05-16 DIAGNOSIS — G4700 Insomnia, unspecified: Secondary | ICD-10-CM | POA: Diagnosis not present

## 2019-05-16 DIAGNOSIS — F329 Major depressive disorder, single episode, unspecified: Secondary | ICD-10-CM | POA: Diagnosis not present

## 2019-05-16 DIAGNOSIS — D709 Neutropenia, unspecified: Secondary | ICD-10-CM

## 2019-05-16 DIAGNOSIS — Z79899 Other long term (current) drug therapy: Secondary | ICD-10-CM | POA: Diagnosis not present

## 2019-05-16 DIAGNOSIS — N400 Enlarged prostate without lower urinary tract symptoms: Secondary | ICD-10-CM | POA: Diagnosis not present

## 2019-05-16 DIAGNOSIS — Z85820 Personal history of malignant melanoma of skin: Secondary | ICD-10-CM | POA: Insufficient documentation

## 2019-05-16 NOTE — Progress Notes (Signed)
Pt has no sx. He went in for physical and lab showed low wbc and then it was repeated but still low.

## 2019-05-17 ENCOUNTER — Inpatient Hospital Stay: Payer: PPO

## 2019-05-17 ENCOUNTER — Other Ambulatory Visit: Payer: Self-pay

## 2019-05-17 DIAGNOSIS — Z79899 Other long term (current) drug therapy: Secondary | ICD-10-CM | POA: Diagnosis not present

## 2019-05-17 DIAGNOSIS — Z85820 Personal history of malignant melanoma of skin: Secondary | ICD-10-CM | POA: Diagnosis not present

## 2019-05-17 DIAGNOSIS — D709 Neutropenia, unspecified: Secondary | ICD-10-CM

## 2019-05-17 DIAGNOSIS — F329 Major depressive disorder, single episode, unspecified: Secondary | ICD-10-CM | POA: Diagnosis not present

## 2019-05-17 DIAGNOSIS — G4700 Insomnia, unspecified: Secondary | ICD-10-CM | POA: Diagnosis not present

## 2019-05-17 DIAGNOSIS — N400 Enlarged prostate without lower urinary tract symptoms: Secondary | ICD-10-CM | POA: Diagnosis not present

## 2019-05-17 LAB — FOLATE: Folate: 11.5 ng/mL (ref >5.9)

## 2019-05-17 LAB — VITAMIN B12: Vitamin B-12: 644 pg/mL (ref 180–914)

## 2019-05-17 LAB — TECHNOLOGIST SMEAR REVIEW: Tech Review: NORMAL

## 2019-05-17 LAB — CBC WITH DIFFERENTIAL/PLATELET
Abs Immature Granulocytes: 0.02 10*3/uL (ref 0.00–0.07)
Basophils Absolute: 0 10*3/uL (ref 0.0–0.1)
Basophils Relative: 1 %
Eosinophils Absolute: 0.1 10*3/uL (ref 0.0–0.5)
Eosinophils Relative: 2 %
HCT: 37.4 % — ABNORMAL LOW (ref 39.0–52.0)
Hemoglobin: 12.2 g/dL — ABNORMAL LOW (ref 13.0–17.0)
Immature Granulocytes: 1 %
Lymphocytes Relative: 18 %
Lymphs Abs: 0.6 10*3/uL — ABNORMAL LOW (ref 0.7–4.0)
MCH: 30.4 pg (ref 26.0–34.0)
MCHC: 32.6 g/dL (ref 30.0–36.0)
MCV: 93.3 fL (ref 80.0–100.0)
Monocytes Absolute: 0.4 10*3/uL (ref 0.1–1.0)
Monocytes Relative: 11 %
Neutro Abs: 2.4 10*3/uL (ref 1.7–7.7)
Neutrophils Relative %: 67 %
Platelets: 208 10*3/uL (ref 150–400)
RBC: 4.01 MIL/uL — ABNORMAL LOW (ref 4.22–5.81)
RDW: 13.1 % (ref 11.5–15.5)
WBC: 3.5 10*3/uL — ABNORMAL LOW (ref 4.0–10.5)
nRBC: 0 % (ref 0.0–0.2)

## 2019-05-20 ENCOUNTER — Other Ambulatory Visit: Payer: PPO

## 2019-05-21 ENCOUNTER — Telehealth: Payer: Self-pay

## 2019-05-21 ENCOUNTER — Encounter: Payer: Self-pay | Admitting: Oncology

## 2019-05-21 ENCOUNTER — Telehealth: Payer: Self-pay | Admitting: *Deleted

## 2019-05-21 DIAGNOSIS — K219 Gastro-esophageal reflux disease without esophagitis: Secondary | ICD-10-CM

## 2019-05-21 NOTE — Telephone Encounter (Signed)
Called and spoke to patient and let him know that his b12 and folate was normal, the smear of the blood was normal. The only thing that was low was low wbc and that has not changed since 2017.  She will continue to monitor it and pt. Already has appts made and he has them. He was happy to hear the news and will see [place] at next appt
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
## 2019-05-21 NOTE — Telephone Encounter (Signed)
Copied from [place] [id number]. Topic: General - Other >> May 21, 2019  2:00 PM Rainey Pines A wrote: Patient stated that Dr. Caryl Bis wanted patient to discontinue use of Cholecalciferol (VITAMIN D) 50 MCG (2000 UT) CAPS. Patient stated that hes had heartburn everyday since and has been taking tums. Patient stated that he needs some type of relief and would like call back from nurse at [phone]

## 2019-05-22 ENCOUNTER — Telehealth: Payer: Self-pay | Admitting: *Deleted

## 2019-05-24 MED ORDER — OMEPRAZOLE 20 MG PO CPDR: 20.0000 mg | DELAYED_RELEASE_CAPSULE | Freq: Every day | ORAL | 1 refills | Status: DC

## 2019-05-24 NOTE — Telephone Encounter (Signed)
Called and spoke with the pt's wife and she stated she did not want him taking the omeprazole to cancel the RX what she wants is a GI referral to see whats going on because he was taking the medication every once in a while and now he is taking it daily. She is asking if you could put in the referral to see if maybe it is something other than the food, because he eats healthy. She is continuing to give the pt his Vitamin D Tijah Hane,cma

## 2019-05-24 NOTE — Addendum Note (Signed)
Addended by: Leone Haven on: 05/24/2019 12:34 PM   Modules accepted: Orders

## 2019-05-24 NOTE — Telephone Encounter (Addendum)
We previously discussed having him discontinue the omeprazole.  He called after his visit and was then advised that he could continue taking it if he had recurrence of heartburn.  He can go back on the omeprazole.  I will send this to his pharmacy.  If his symptoms do not come under control he needs to let us know.  We never discussed discontinuing the vitamin D.  He can continue on the vitamin D supplement.

## 2019-05-26 NOTE — Telephone Encounter (Signed)
If he is having reflux symptoms he should take something daily to prevent the symptoms and the reflux could damage his esophagus. I would suggest he take the omeprazole. I will place a referral to GI for him as well.

## 2019-05-26 NOTE — Addendum Note (Signed)
Addended by: Leone Haven on: 05/26/2019 01:17 PM   Modules accepted: Orders

## 2019-05-27 NOTE — Telephone Encounter (Signed)
Patient's wife stated that someone called and she does already have a GI appt upcoming soon.  Nina,cma

## 2019-05-27 NOTE — Telephone Encounter (Signed)
Noted  

## 2019-05-28 ENCOUNTER — Encounter: Payer: Self-pay | Admitting: Gastroenterology

## 2019-05-28 ENCOUNTER — Other Ambulatory Visit: Payer: Self-pay

## 2019-05-28 ENCOUNTER — Ambulatory Visit (INDEPENDENT_AMBULATORY_CARE_PROVIDER_SITE_OTHER): Payer: PPO | Admitting: Gastroenterology

## 2019-05-28 VITALS — BP 113/76 | HR 85 | Temp 97.8°F | Ht 67.5 in | Wt 145.2 lb

## 2019-05-28 DIAGNOSIS — K219 Gastro-esophageal reflux disease without esophagitis: Secondary | ICD-10-CM

## 2019-05-28 NOTE — Patient Instructions (Signed)
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
## 2019-07-02 ENCOUNTER — Other Ambulatory Visit: Payer: Self-pay

## 2019-07-02 ENCOUNTER — Ambulatory Visit: Payer: PPO

## 2019-07-03 ENCOUNTER — Other Ambulatory Visit: Payer: Self-pay

## 2019-07-03 ENCOUNTER — Ambulatory Visit (INDEPENDENT_AMBULATORY_CARE_PROVIDER_SITE_OTHER): Payer: PPO

## 2019-07-03 DIAGNOSIS — Z0000 Encounter for general adult medical examination without abnormal findings: Secondary | ICD-10-CM | POA: Diagnosis not present

## 2019-07-03 NOTE — Patient Instructions (Addendum)
  Mr. Rodney Branch , Thank you for taking time to come for your Medicare Wellness Visit. I appreciate your ongoing commitment to your health goals. Please review the following plan we discussed and let me know if I can assist you in the future.   These are the goals we discussed: Goals      Patient Stated   . Increase physical activity (pt-stated)     Continue to walk for exercise as tolerated       This is a list of the screening recommended for you and due dates:  Health Maintenance  Topic Date Due  . Flu Shot  06/29/2019  . Tetanus Vaccine  08/29/2028  . Pneumonia vaccines  Completed
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
## 2019-07-04 NOTE — Progress Notes (Signed)
I have reviewed the above note and agree.  Gabriellia Rempel, M.D.  

## 2019-07-11 ENCOUNTER — Telehealth: Payer: Self-pay | Admitting: Family Medicine

## 2019-07-11 DIAGNOSIS — K5900 Constipation, unspecified: Secondary | ICD-10-CM

## 2019-07-11 MED ORDER — POLYETHYLENE GLYCOL 3350 17 GM/SCOOP PO POWD: 17.0000 g | Freq: Every day | ORAL | 0 refills | Status: DC | PRN

## 2019-07-11 NOTE — Telephone Encounter (Signed)
Sent to pharmacy 

## 2019-07-11 NOTE — Telephone Encounter (Signed)
Relation to pt: self  Call back number: [phone]  Pharmacy: [facility] [address] [place], [place], [place] [address] [phone]   Reason for call:  Patient requesting polyethylene glycol powder (GLYCOLAX/MIRALAX) 17 GM/SCOOP powder, patient states he's completely out, informed patient please allow 48 hour turn around time, please advise if PCp can fill today, please advise

## 2019-07-11 NOTE — Telephone Encounter (Signed)
Last OV:  04/02/19  Last Refill:  04/02/19

## 2019-07-12 ENCOUNTER — Other Ambulatory Visit: Payer: Self-pay

## 2019-07-12 DIAGNOSIS — K5900 Constipation, unspecified: Secondary | ICD-10-CM

## 2019-07-12 MED ORDER — POLYETHYLENE GLYCOL 3350 17 GM/SCOOP PO POWD: 17.0000 g | Freq: Every day | ORAL | 0 refills | Status: DC | PRN

## 2019-07-12 NOTE — Telephone Encounter (Signed)
Called and informed patient that the pharmacy has the RX and it was sent to [facility] in [place].  Nina,cma

## 2019-07-12 NOTE — Telephone Encounter (Signed)
Pt wife called and stated that medication was sent to the wrong pharmacy. Please send to  [facility], [place] [phone] (Phone) [phone] (Fax)

## 2019-07-16 ENCOUNTER — Other Ambulatory Visit: Payer: Self-pay

## 2019-07-16 ENCOUNTER — Encounter (INDEPENDENT_AMBULATORY_CARE_PROVIDER_SITE_OTHER): Payer: Self-pay

## 2019-07-16 ENCOUNTER — Ambulatory Visit: Payer: PPO | Admitting: Gastroenterology

## 2019-07-16 VITALS — BP 128/89 | HR 85 | Temp 97.7°F | Ht 67.5 in | Wt 141.8 lb

## 2019-07-16 DIAGNOSIS — K219 Gastro-esophageal reflux disease without esophagitis: Secondary | ICD-10-CM

## 2019-07-16 NOTE — Progress Notes (Signed)
Jonathon Bellows MD, MRCP(U.K) [address]  [place]  [place], [place] [address]  Main: [phone]  Fax: [phone]   Primary Care Physician: Leone Haven, MD  Primary Gastroenterologist:  Dr. Jonathon Bellows   Chief Complaint  Patient presents with  . Follow-up    GERD    HPI: CORNEL WERBER is a 85 y.o. male was initially seen and referred on 05/28/2019 for acid reflux.  He has had acid reflux for a few years been on omeprazole 40 mg with meals with good control of symptoms.  He was concerned about the long-term effects of PPI and was hence referred to see me  Since his last visit in June 2020 he has been doing really well.  He has taken himself off the Prilosec and taking only Pepcid 20 mg at night.  With this regimen he has had no symptoms of reflux.  He is adhering to lifestyle changes.  Current Outpatient Medications  Medication Sig Dispense Refill  . Cholecalciferol (VITAMIN D) 50 MCG (2000 UT) CAPS Take 1 capsule by mouth daily.    [name] doxazosin (CARDURA) 8 MG tablet Take 1 tablet (8 mg total) by mouth daily. 90 tablet 1  . FLUZONE HIGH-DOSE QUADRIVALENT 0.7 ML SUSY     . HYDROcodone-acetaminophen (NORCO/VICODIN) 5-325 MG tablet Take 1 tablet by mouth every 4 (four) hours as needed for moderate pain. 12 tablet 0  . omeprazole (PRILOSEC) 20 MG capsule Take 1 capsule (20 mg total) by mouth daily. 30 capsule 1  . PARoxetine (PAXIL) 40 MG tablet Take 1 tablet (40 mg total) by mouth every morning. 90 tablet 1  . polyethylene glycol powder (GLYCOLAX/MIRALAX) 17 GM/SCOOP powder Take 17 g by mouth daily as needed. 850 g 0  . primidone (MYSOLINE) 50 MG tablet Take 3 tablets (150 mg total) by mouth at bedtime. 270 tablet 1  . QUEtiapine (SEROQUEL) 400 MG tablet Take 2 tablets (800 mg total) by mouth at bedtime. 180 tablet 1  . vitamin B-12 (CYANOCOBALAMIN) 1000 MCG tablet Take 1,000 mcg by mouth daily.     No current facility-administered medications for this visit.      Allergies as of 07/16/2019 - Review Complete 07/16/2019  Allergen Reaction Noted  . Metoprolol tartrate Other (See Comments) 11/11/2014  . Propranolol  10/18/2011    ROS:  General: Negative for anorexia, weight loss, fever, chills, fatigue, weakness. ENT: Negative for hoarseness, difficulty swallowing , nasal congestion. CV: Negative for chest pain, angina, palpitations, dyspnea on exertion, peripheral edema.  Respiratory: Negative for dyspnea at rest, dyspnea on exertion, cough, sputum, wheezing.  GI: See history of present illness. GU:  Negative for dysuria, hematuria, urinary incontinence, urinary frequency, nocturnal urination.  Endo: Negative for unusual weight change.    Physical Examination:   BP 128/89   Pulse 85   Temp 97.7 F (36.5 C)   Ht 5' 7.5" (1.715 m)   Wt 141 lb 12.8 oz (64.3 kg)   BMI 21.88 kg/m   General: Well-nourished, well-developed in no acute distress.  Eyes: No icterus. Conjunctivae pink. Mouth: Oropharyngeal mucosa moist and pink , no lesions erythema or exudate. Lungs: Clear to auscultation bilaterally. Non-labored. Heart: Regular rate and rhythm, no murmurs rubs or gallops.  Abdomen: Bowel sounds are normal, nontender, nondistended, no hepatosplenomegaly or masses, no abdominal bruits or hernia , no rebound or guarding.   Extremities: No lower extremity edema. No clubbing or deformities. Neuro: Alert and oriented x 3.  Grossly intact. Skin: Warm  and dry, no jaundice.   Psych: Alert and cooperative, normal mood and affect.   Imaging Studies: No results found.  Assessment and Plan:   RMANI KELLOGG is a 85 y.o. y/o male here to follow-up for acid reflux.  Suggest to continue lifestyle changes. He is doing really well on 20 mg of Pepcid at night.  Most of his symptoms are secondary to nocturnal reflux.  I would suggest to continue on this dose at night.  If he has occasional symptoms less than 2 episodes a week he can take Tums as needed.  If  there is recurrence of symptoms I have advised him to feel free to call my office and we will fine-tune his medication.  Dr Jonathon Bellows  MD,MRCP [facility]) Follow up in as needed

## 2019-07-18 ENCOUNTER — Other Ambulatory Visit: Payer: Self-pay | Admitting: Family Medicine

## 2019-08-17 ENCOUNTER — Other Ambulatory Visit: Payer: Self-pay | Admitting: Family Medicine

## 2019-08-20 ENCOUNTER — Other Ambulatory Visit: Payer: Self-pay

## 2019-08-20 ENCOUNTER — Inpatient Hospital Stay: Payer: PPO | Attending: Oncology

## 2019-08-20 DIAGNOSIS — D709 Neutropenia, unspecified: Secondary | ICD-10-CM | POA: Diagnosis not present

## 2019-08-20 LAB — CBC WITH DIFFERENTIAL/PLATELET
Abs Immature Granulocytes: 0.03 10*3/uL (ref 0.00–0.07)
Basophils Absolute: 0 10*3/uL (ref 0.0–0.1)
Basophils Relative: 1 %
Eosinophils Absolute: 0.2 10*3/uL (ref 0.0–0.5)
Eosinophils Relative: 4 %
HCT: 38 % — ABNORMAL LOW (ref 39.0–52.0)
Hemoglobin: 12.3 g/dL — ABNORMAL LOW (ref 13.0–17.0)
Immature Granulocytes: 1 %
Lymphocytes Relative: 25 %
Lymphs Abs: 0.9 10*3/uL (ref 0.7–4.0)
MCH: 30.4 pg (ref 26.0–34.0)
MCHC: 32.4 g/dL (ref 30.0–36.0)
MCV: 93.8 fL (ref 80.0–100.0)
Monocytes Absolute: 0.5 10*3/uL (ref 0.1–1.0)
Monocytes Relative: 13 %
Neutro Abs: 2 10*3/uL (ref 1.7–7.7)
Neutrophils Relative %: 56 %
Platelets: 203 10*3/uL (ref 150–400)
RBC: 4.05 MIL/uL — ABNORMAL LOW (ref 4.22–5.81)
RDW: 14.9 % (ref 11.5–15.5)
WBC: 3.6 10*3/uL — ABNORMAL LOW (ref 4.0–10.5)
nRBC: 0 % (ref 0.0–0.2)

## 2019-09-11 DIAGNOSIS — H43813 Vitreous degeneration, bilateral: Secondary | ICD-10-CM | POA: Diagnosis not present

## 2019-09-24 DIAGNOSIS — H6123 Impacted cerumen, bilateral: Secondary | ICD-10-CM | POA: Diagnosis not present

## 2019-09-24 DIAGNOSIS — H93291 Other abnormal auditory perceptions, right ear: Secondary | ICD-10-CM | POA: Diagnosis not present

## 2019-09-29 ENCOUNTER — Other Ambulatory Visit: Payer: Self-pay | Admitting: Family Medicine

## 2019-09-29 DIAGNOSIS — N400 Enlarged prostate without lower urinary tract symptoms: Secondary | ICD-10-CM

## 2019-10-01 ENCOUNTER — Other Ambulatory Visit: Payer: Self-pay | Admitting: Family Medicine

## 2019-10-01 DIAGNOSIS — N400 Enlarged prostate without lower urinary tract symptoms: Secondary | ICD-10-CM

## 2019-10-30 ENCOUNTER — Encounter: Payer: Self-pay | Admitting: Psychiatry

## 2019-10-30 ENCOUNTER — Other Ambulatory Visit: Payer: Self-pay

## 2019-10-30 ENCOUNTER — Ambulatory Visit (INDEPENDENT_AMBULATORY_CARE_PROVIDER_SITE_OTHER): Payer: PPO | Admitting: Psychiatry

## 2019-10-30 DIAGNOSIS — F5105 Insomnia due to other mental disorder: Secondary | ICD-10-CM | POA: Diagnosis not present

## 2019-10-30 DIAGNOSIS — F319 Bipolar disorder, unspecified: Secondary | ICD-10-CM

## 2019-10-30 DIAGNOSIS — G250 Essential tremor: Secondary | ICD-10-CM

## 2019-10-30 MED ORDER — QUETIAPINE FUMARATE 400 MG PO TABS: 800.0000 mg | ORAL_TABLET | Freq: Every day | ORAL | 3 refills | Status: DC

## 2019-10-30 MED ORDER — PRIMIDONE 50 MG PO TABS: 150.0000 mg | ORAL_TABLET | Freq: Every day | ORAL | 3 refills | Status: DC

## 2019-10-30 MED ORDER — PAROXETINE HCL 40 MG PO TABS: 40.0000 mg | ORAL_TABLET | Freq: Every morning | ORAL | 3 refills | Status: DC
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
## 2019-11-15 ENCOUNTER — Encounter: Payer: Self-pay | Admitting: Oncology

## 2019-11-15 ENCOUNTER — Inpatient Hospital Stay (HOSPITAL_BASED_OUTPATIENT_CLINIC_OR_DEPARTMENT_OTHER): Payer: PPO | Admitting: Oncology

## 2019-11-15 ENCOUNTER — Other Ambulatory Visit: Payer: Self-pay

## 2019-11-15 ENCOUNTER — Inpatient Hospital Stay: Payer: PPO | Attending: Oncology

## 2019-11-15 VITALS — BP 92/60 | HR 112 | Temp 95.7°F | Resp 16 | Wt 141.1 lb

## 2019-11-15 DIAGNOSIS — Z85820 Personal history of malignant melanoma of skin: Secondary | ICD-10-CM | POA: Insufficient documentation

## 2019-11-15 DIAGNOSIS — D709 Neutropenia, unspecified: Secondary | ICD-10-CM | POA: Diagnosis not present

## 2019-11-15 DIAGNOSIS — Z833 Family history of diabetes mellitus: Secondary | ICD-10-CM | POA: Diagnosis not present

## 2019-11-15 DIAGNOSIS — Z8249 Family history of ischemic heart disease and other diseases of the circulatory system: Secondary | ICD-10-CM | POA: Insufficient documentation

## 2019-11-15 DIAGNOSIS — N400 Enlarged prostate without lower urinary tract symptoms: Secondary | ICD-10-CM | POA: Insufficient documentation

## 2019-11-15 DIAGNOSIS — F329 Major depressive disorder, single episode, unspecified: Secondary | ICD-10-CM | POA: Insufficient documentation

## 2019-11-15 DIAGNOSIS — Z800 Family history of malignant neoplasm of digestive organs: Secondary | ICD-10-CM | POA: Insufficient documentation

## 2019-11-15 DIAGNOSIS — Z79899 Other long term (current) drug therapy: Secondary | ICD-10-CM | POA: Diagnosis not present

## 2019-11-15 LAB — CBC WITH DIFFERENTIAL/PLATELET
Abs Immature Granulocytes: 0.01 10*3/uL (ref 0.00–0.07)
Basophils Absolute: 0 10*3/uL (ref 0.0–0.1)
Basophils Relative: 1 %
Eosinophils Absolute: 0.1 10*3/uL (ref 0.0–0.5)
Eosinophils Relative: 3 %
HCT: 39.4 % (ref 39.0–52.0)
Hemoglobin: 12.2 g/dL — ABNORMAL LOW (ref 13.0–17.0)
Immature Granulocytes: 0 %
Lymphocytes Relative: 24 %
Lymphs Abs: 0.9 10*3/uL (ref 0.7–4.0)
MCH: 30.2 pg (ref 26.0–34.0)
MCHC: 31 g/dL (ref 30.0–36.0)
MCV: 97.5 fL (ref 80.0–100.0)
Monocytes Absolute: 0.4 10*3/uL (ref 0.1–1.0)
Monocytes Relative: 10 %
Neutro Abs: 2.3 10*3/uL (ref 1.7–7.7)
Neutrophils Relative %: 62 %
Platelets: 220 10*3/uL (ref 150–400)
RBC: 4.04 MIL/uL — ABNORMAL LOW (ref 4.22–5.81)
RDW: 14.5 % (ref 11.5–15.5)
WBC: 3.7 10*3/uL — ABNORMAL LOW (ref 4.0–10.5)
nRBC: 0 % (ref 0.0–0.2)

## 2019-11-19 ENCOUNTER — Other Ambulatory Visit: Payer: PPO

## 2019-11-19 ENCOUNTER — Ambulatory Visit: Payer: PPO | Admitting: Oncology
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
## 2020-01-20 ENCOUNTER — Encounter: Payer: Self-pay | Admitting: Family Medicine

## 2020-03-10 NOTE — Telephone Encounter (Signed)
Opened in error

## 2020-03-29 ENCOUNTER — Other Ambulatory Visit: Payer: Self-pay | Admitting: Family Medicine

## 2020-03-29 DIAGNOSIS — N400 Enlarged prostate without lower urinary tract symptoms: Secondary | ICD-10-CM

## 2020-03-31 ENCOUNTER — Other Ambulatory Visit: Payer: Self-pay | Admitting: Family Medicine

## 2020-03-31 DIAGNOSIS — N400 Enlarged prostate without lower urinary tract symptoms: Secondary | ICD-10-CM

## 2020-04-01 ENCOUNTER — Telehealth: Payer: Self-pay | Admitting: Family Medicine

## 2020-04-01 ENCOUNTER — Other Ambulatory Visit: Payer: Self-pay

## 2020-04-01 DIAGNOSIS — N400 Enlarged prostate without lower urinary tract symptoms: Secondary | ICD-10-CM

## 2020-04-01 MED ORDER — DOXAZOSIN MESYLATE 8 MG PO TABS: 8.0000 mg | ORAL_TABLET | Freq: Every day | ORAL | 3 refills | Status: DC

## 2020-04-01 NOTE — Telephone Encounter (Signed)
Patient wanted to know why he onlly got 1 refill on his doxazosin (CARDURA) 8 MG tablet. Please call pt.

## 2020-04-01 NOTE — Telephone Encounter (Signed)
I called and spoke with the patients wife and she stated the patient was going to [facility] to pick up his RX.  Shalan Neault,cma

## 2020-04-01 NOTE — Telephone Encounter (Signed)
Pt called back and I let him know that prescription for doxazosine was sent to [facility] on yesterday. Do you know if this prescription needs a prior authorization as to why patient can't get it from the pharmacy?

## 2020-04-01 NOTE — Telephone Encounter (Signed)
Pt called wants a refill on medication

## 2020-04-01 NOTE — Telephone Encounter (Signed)
Patient wanted mor refills from 0 to 3 refills and wanted to know why the provider did not give him the 3 refills because he really needs this medication, I explained to the patient that he needs a follow up appt because he has not been seen in a while. An appointment was scheduled for 04/24/2020 and I changed the Rx to 3 refills.  Lillianne Eick,cma

## 2020-04-24 ENCOUNTER — Telehealth: Payer: Self-pay | Admitting: Family Medicine

## 2020-04-24 ENCOUNTER — Ambulatory Visit (INDEPENDENT_AMBULATORY_CARE_PROVIDER_SITE_OTHER): Payer: PPO | Admitting: Family Medicine

## 2020-04-24 ENCOUNTER — Other Ambulatory Visit: Payer: Self-pay

## 2020-04-24 ENCOUNTER — Encounter: Payer: Self-pay | Admitting: Family Medicine

## 2020-04-24 ENCOUNTER — Telehealth: Payer: Self-pay | Admitting: *Deleted

## 2020-04-24 DIAGNOSIS — N400 Enlarged prostate without lower urinary tract symptoms: Secondary | ICD-10-CM

## 2020-04-24 DIAGNOSIS — K219 Gastro-esophageal reflux disease without esophagitis: Secondary | ICD-10-CM | POA: Diagnosis not present

## 2020-04-24 DIAGNOSIS — D709 Neutropenia, unspecified: Secondary | ICD-10-CM | POA: Insufficient documentation

## 2020-04-24 DIAGNOSIS — F39 Unspecified mood [affective] disorder: Secondary | ICD-10-CM | POA: Diagnosis not present

## 2020-04-24 LAB — COMPREHENSIVE METABOLIC PANEL
ALT: 9 U/L (ref 0–53)
AST: 15 U/L (ref 0–37)
Albumin: 4.1 g/dL (ref 3.5–5.2)
Alkaline Phosphatase: 100 U/L (ref 39–117)
BUN: 23 mg/dL (ref 6–23)
CO2: 27 mEq/L (ref 19–32)
Calcium: 8.7 mg/dL (ref 8.4–10.5)
Chloride: 108 mEq/L (ref 96–112)
Creatinine, Ser: 1.06 mg/dL (ref 0.40–1.50)
GFR: 66.09 mL/min (ref >60.00)
Glucose, Bld: 109 mg/dL — ABNORMAL HIGH (ref 70–99)
Potassium: 3.8 mEq/L (ref 3.5–5.1)
Sodium: 141 mEq/L (ref 135–145)
Total Bilirubin: 0.3 mg/dL (ref 0.2–1.2)
Total Protein: 6.3 g/dL (ref 6.0–8.3)

## 2020-04-24 NOTE — Telephone Encounter (Signed)
Pt called to let Dr. Biagio Quint know that he is taking omeprazole 20mg 

## 2020-04-24 NOTE — Progress Notes (Signed)
  [name], MD Phone: [phone]  Rodney Branch is a 86 y.o. male who presents today for f/u.  GERD:   Reflux symptoms: no   Abd pain: no   Blood in stool: no  Dysphagia: no   EGD: no history of this  Medication: taking either pepcid or omeprazole, patient will confirm once home  BPH: Strain- no Flow- good Frequency- no Urgency- no Nocturia- 1x Dysuria- no Emptying bladder- yes Medication- cardura  Bipolar disorder: Follows with psychiatry.  Currently on Seroquel and Paxil.  No depression.  No SI.  Social History   Tobacco Use  Smoking Status Never Smoker  Smokeless Tobacco Never Used     ROS see history of present illness  Objective  Physical Exam Vitals:   04/24/20 0959  BP: 125/70  Pulse: 72  Temp: (!) 96.7 F (35.9 C)  SpO2: 96%    BP Readings from Last 3 Encounters:  04/24/20 125/70  11/15/19 92/60  07/16/19 128/89   Wt Readings from Last 3 Encounters:  04/24/20 139 lb 9.6 oz (63.3 kg)  11/15/19 141 lb 1.6 oz (64 kg)  07/16/19 141 lb 12.8 oz (64.3 kg)    Physical Exam Constitutional:      General: He is not in acute distress.    Appearance: He is not diaphoretic.  Cardiovascular:     Rate and Rhythm: Normal rate and regular rhythm.     Heart sounds: Normal heart sounds.  Pulmonary:     Effort: Pulmonary effort is normal.     Breath sounds: Normal breath sounds.  Abdominal:     General: Bowel sounds are normal. There is no distension.     Palpations: Abdomen is soft.     Tenderness: There is no abdominal tenderness. There is no guarding or rebound.  Skin:    General: Skin is warm and dry.  Neurological:     Mental Status: He is alert.      Assessment/Plan: Please see individual problem list.  GERD Well-controlled.  He previously saw GI.  Patient will confirm if he is taking Pepcid or omeprazole.  We can refill once we know this.  BPH without obstruction/lower urinary tract symptoms Well-controlled.  Continue  Cardura.  Mood disorder Bipolar disorder.  Seems to be well controlled.  Patient will continue to see psychiatry.  Neutropenia (HCC) Chronic issue.  He is following with hematology.  He is coming up on being due for his CBC to be rechecked.  We will draw this today and forward to hematology.  Discussed that his numbers have been relatively stable for several years.  Advised that they would continue to monitor this periodically.   Health Maintenance: Patient will drop off a copy of his Covid vaccine card.  Orders Placed This Encounter  Procedures  . Comp Met (CMET)  . CBC with Differential/Platelet    Standing Status:   Future    Standing Expiration Date:   04/24/2021    No orders of the defined types were placed in this encounter.   This visit occurred during the SARS-CoV-2 public health emergency.  Safety protocols were in place, including screening questions prior to the visit, additional usage of staff PPE, and extensive cleaning of exam room while observing appropriate contact time as indicated for disinfecting solutions.    [name], MD [place] [facility]

## 2020-04-24 NOTE — Patient Instructions (Signed)
Nice to see you. We will get labs today. You should have refills of the Cardura at your pharmacy.

## 2020-04-24 NOTE — Telephone Encounter (Signed)
Noted. He should have refills available.

## 2020-04-24 NOTE — Assessment & Plan Note (Signed)
Well-controlled.  He previously saw GI.  Patient will confirm if he is taking Pepcid or omeprazole.  We can refill once we know this.

## 2020-04-24 NOTE — Assessment & Plan Note (Signed)
Well-controlled.  Continue Cardura.

## 2020-04-24 NOTE — Assessment & Plan Note (Addendum)
Chronic issue.  He is following with hematology.  He is coming up on being due for his CBC to be rechecked.  We will draw this today and forward to hematology.  Discussed that his numbers have been relatively stable for several years.  Advised that they would continue to monitor this periodically.

## 2020-04-24 NOTE — Telephone Encounter (Signed)
Per pt request to have all appts cx. I stated that he doesn't wish to have any appt R/S at this time.. All appts were cx per pt request on 04/24/20

## 2020-04-24 NOTE — Telephone Encounter (Signed)
Pt called to let Dr. Biagio Quint know that he is taking omeprazole 20mg . Geeta Dworkin,cma

## 2020-04-24 NOTE — Assessment & Plan Note (Signed)
Bipolar disorder.  Seems to be well controlled.  Patient will continue to see psychiatry.

## 2020-04-30 ENCOUNTER — Other Ambulatory Visit: Payer: Self-pay

## 2020-04-30 ENCOUNTER — Other Ambulatory Visit (INDEPENDENT_AMBULATORY_CARE_PROVIDER_SITE_OTHER): Payer: PPO

## 2020-04-30 DIAGNOSIS — D709 Neutropenia, unspecified: Secondary | ICD-10-CM

## 2020-04-30 LAB — CBC WITH DIFFERENTIAL/PLATELET
Basophils Absolute: 0 10*3/uL (ref 0.0–0.1)
Basophils Relative: 0.4 % (ref 0.0–3.0)
Eosinophils Absolute: 0.1 10*3/uL (ref 0.0–0.7)
Eosinophils Relative: 2.4 % (ref 0.0–5.0)
HCT: 37.8 % — ABNORMAL LOW (ref 39.0–52.0)
Hemoglobin: 12.5 g/dL — ABNORMAL LOW (ref 13.0–17.0)
Lymphocytes Relative: 26 % (ref 12.0–46.0)
Lymphs Abs: 1.1 10*3/uL (ref 0.7–4.0)
MCHC: 33.1 g/dL (ref 30.0–36.0)
MCV: 92.6 fl (ref 78.0–100.0)
Monocytes Absolute: 0.5 10*3/uL (ref 0.1–1.0)
Monocytes Relative: 12.4 % — ABNORMAL HIGH (ref 3.0–12.0)
Neutro Abs: 2.4 10*3/uL (ref 1.4–7.7)
Neutrophils Relative %: 58.8 % (ref 43.0–77.0)
Platelets: 215 10*3/uL (ref 150.0–400.0)
RBC: 4.08 Mil/uL — ABNORMAL LOW (ref 4.22–5.81)
RDW: 14.1 % (ref 11.5–15.5)
WBC: 4.1 10*3/uL (ref 4.0–10.5)

## 2020-05-15 ENCOUNTER — Other Ambulatory Visit: Payer: PPO

## 2020-07-03 ENCOUNTER — Ambulatory Visit (INDEPENDENT_AMBULATORY_CARE_PROVIDER_SITE_OTHER): Payer: PPO

## 2020-07-03 VITALS — Ht 67.5 in | Wt 139.0 lb

## 2020-07-03 DIAGNOSIS — Z0000 Encounter for general adult medical examination without abnormal findings: Secondary | ICD-10-CM

## 2020-07-03 NOTE — Patient Instructions (Addendum)
Rodney Branch , Thank you for taking time to come for your Medicare Wellness Visit. I appreciate your ongoing commitment to your health goals. Please review the following plan we discussed and let me know if I can assist you in the future.   These are the goals we discussed: Goals      Patient Stated   .  Increase physical activity (pt-stated)      Continue to walk for exercise to the mailbox        This is a list of the screening recommended for you and due dates:  Health Maintenance  Topic Date Due  . Flu Shot  06/28/2020  . Tetanus Vaccine  08/29/2028  . COVID-19 Vaccine  Completed  . Pneumonia vaccines  Completed    Immunizations Immunization History  Administered Date(s) Administered  . H1N1 12/04/2008  . Influenza Split 08/18/2011  . Influenza Whole 08/30/2007, 08/27/2008, 08/31/2010  . Influenza, High Dose Seasonal PF 07/08/2019  . Influenza,inj,Quad PF,6+ Mos 08/14/2015  . Influenza-Unspecified 07/29/2014, 09/11/2017, 09/28/2018, 07/08/2019  . PFIZER SARS-COV-2 Vaccination 01/20/2020, 02/10/2020  . Pneumococcal Conjugate-13 04/17/2014  . Pneumococcal Polysaccharide-23 08/28/2009  . Td 12/21/2006  . Tdap 09/06/2017, 08/29/2018  . Zoster 12/25/2006   Keep all routine maintenance appointments.   Cpe 04/30/21   Advanced directives: End of life planning; Advance aging; Advanced directives discussed.  Copy of current HCPOA/Living Will requested.    Conditions/risks identified: none new.   Follow up in one year for your annual wellness visit.   Preventive Care 42 Years and Older, Male Preventive care refers to lifestyle choices and visits with your health care provider that can promote health and wellness. What does preventive care include?  A yearly physical exam. This is also called an annual well check.  Dental exams once or twice a year.  Routine eye exams. Ask your health care provider how often you should have your eyes checked.  Personal lifestyle  choices, including:  Daily care of your teeth and gums.  Regular physical activity.  Eating a healthy diet.  Avoiding tobacco and drug use.  Limiting alcohol use.  Practicing safe sex.  Taking low doses of aspirin every day.  Taking vitamin and mineral supplements as recommended by your health care provider. What happens during an annual well check? The services and screenings done by your health care provider during your annual well check will depend on your age, overall health, lifestyle risk factors, and family history of disease. Counseling  Your health care provider may ask you questions about your:  Alcohol use.  Tobacco use.  Drug use.  Emotional well-being.  Home and relationship well-being.  Sexual activity.  Eating habits.  History of falls.  Memory and ability to understand (cognition).  Work and work Statistician. Screening  You may have the following tests or measurements:  Height, weight, and BMI.  Blood pressure.  Lipid and cholesterol levels. These may be checked every 5 years, or more frequently if you are over 22 years old.  Skin check.  Lung cancer screening. You may have this screening every year starting at age 40 if you have a 30-pack-year history of smoking and currently smoke or have quit within the past 15 years.  Fecal occult blood test (FOBT) of the stool. You may have this test every year starting at age 69.  Flexible sigmoidoscopy or colonoscopy. You may have a sigmoidoscopy every 5 years or a colonoscopy every 10 years starting at age 82.  Prostate cancer screening. Recommendations  will vary depending on your family history and other risks.  Hepatitis C blood test.  Hepatitis B blood test.  Sexually transmitted disease (STD) testing.  Diabetes screening. This is done by checking your blood sugar (glucose) after you have not eaten for a while (fasting). You may have this done every 1-3 years.  Abdominal aortic aneurysm  (AAA) screening. You may need this if you are a current or former smoker.  Osteoporosis. You may be screened starting at age 59 if you are at high risk. Talk with your health care provider about your test results, treatment options, and if necessary, the need for more tests. Vaccines  Your health care provider may recommend certain vaccines, such as:  Influenza vaccine. This is recommended every year.  Tetanus, diphtheria, and acellular pertussis (Tdap, Td) vaccine. You may need a Td booster every 10 years.  Zoster vaccine. You may need this after age 31.  Pneumococcal 13-valent conjugate (PCV13) vaccine. One dose is recommended after age 47.  Pneumococcal polysaccharide (PPSV23) vaccine. One dose is recommended after age 29. Talk to your health care provider about which screenings and vaccines you need and how often you need them. This information is not intended to replace advice given to you by your health care provider. Make sure you discuss any questions you have with your health care provider. Document Released: 12/11/2015 Document Revised: 08/03/2016 Document Reviewed: 09/15/2015 Elsevier Interactive Patient Education  2017 [place] Prevention in the Home Falls can cause injuries. They can happen to people of all ages. There are many things you can do to make your home safe and to help prevent falls. What can I do on the outside of my home?  Regularly fix the edges of walkways and driveways and fix any cracks.  Remove anything that might make you trip as you walk through a door, such as a raised step or threshold.  Trim any bushes or trees on the path to your home.  Use bright outdoor lighting.  Clear any walking paths of anything that might make someone trip, such as rocks or tools.  Regularly check to see if handrails are loose or broken. Make sure that both sides of any steps have handrails.  Any raised decks and porches should have guardrails on the  edges.  Have any leaves, snow, or ice cleared regularly.  Use sand or salt on walking paths during winter.  Clean up any spills in your garage right away. This includes oil or grease spills. What can I do in the bathroom?  Use night lights.  Install grab bars by the toilet and in the tub and shower. Do not use towel bars as grab bars.  Use non-skid mats or decals in the tub or shower.  If you need to sit down in the shower, use a plastic, non-slip stool.  Keep the floor dry. Clean up any water that spills on the floor as soon as it happens.  Remove soap buildup in the tub or shower regularly.  Attach bath mats securely with double-sided non-slip rug tape.  Do not have throw rugs and other things on the floor that can make you trip. What can I do in the bedroom?  Use night lights.  Make sure that you have a light by your bed that is easy to reach.  Do not use any sheets or blankets that are too big for your bed. They should not hang down onto the floor.  Have a firm chair that  has side arms. You can use this for support while you get dressed.  Do not have throw rugs and other things on the floor that can make you trip. What can I do in the kitchen?  Clean up any spills right away.  Avoid walking on wet floors.  Keep items that you use a lot in easy-to-reach places.  If you need to reach something above you, use a strong step stool that has a grab bar.  Keep electrical cords out of the way.  Do not use floor polish or wax that makes floors slippery. If you must use wax, use non-skid floor wax.  Do not have throw rugs and other things on the floor that can make you trip. What can I do with my stairs?  Do not leave any items on the stairs.  Make sure that there are handrails on both sides of the stairs and use them. Fix handrails that are broken or loose. Make sure that handrails are as long as the stairways.  Check any carpeting to make sure that it is firmly  attached to the stairs. Fix any carpet that is loose or worn.  Avoid having throw rugs at the top or bottom of the stairs. If you do have throw rugs, attach them to the floor with carpet tape.  Make sure that you have a light switch at the top of the stairs and the bottom of the stairs. If you do not have them, ask someone to add them for you. What else can I do to help prevent falls?  Wear shoes that:  Do not have high heels.  Have rubber bottoms.  Are comfortable and fit you well.  Are closed at the toe. Do not wear sandals.  If you use a stepladder:  Make sure that it is fully opened. Do not climb a closed stepladder.  Make sure that both sides of the stepladder are locked into place.  Ask someone to hold it for you, if possible.  Clearly mark and make sure that you can see:  Any grab bars or handrails.  First and last steps.  Where the edge of each step is.  Use tools that help you move around (mobility aids) if they are needed. These include:  Canes.  Walkers.  Scooters.  Crutches.  Turn on the lights when you go into a dark area. Replace any light bulbs as soon as they burn out.  Set up your furniture so you have a clear path. Avoid moving your furniture around.  If any of your floors are uneven, fix them.  If there are any pets around you, be aware of where they are.  Review your medicines with your doctor. Some medicines can make you feel dizzy. This can increase your chance of falling. Ask your doctor what other things that you can do to help prevent falls. This information is not intended to replace advice given to you by your health care provider. Make sure you discuss any questions you have with your health care provider. Document Released: 09/10/2009 Document Revised: 04/21/2016 Document Reviewed: 12/19/2014 Elsevier Interactive Patient Education  2017 [employer].
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
## 2020-08-23 ENCOUNTER — Other Ambulatory Visit: Payer: Self-pay | Admitting: Family Medicine

## 2020-10-16 ENCOUNTER — Other Ambulatory Visit: Payer: Self-pay

## 2020-10-16 ENCOUNTER — Encounter: Payer: Self-pay | Admitting: Family Medicine

## 2020-10-16 ENCOUNTER — Telehealth (INDEPENDENT_AMBULATORY_CARE_PROVIDER_SITE_OTHER): Payer: PPO | Admitting: Family Medicine

## 2020-10-16 VITALS — Ht 67.0 in | Wt 139.0 lb

## 2020-10-16 DIAGNOSIS — M542 Cervicalgia: Secondary | ICD-10-CM

## 2020-10-16 DIAGNOSIS — K219 Gastro-esophageal reflux disease without esophagitis: Secondary | ICD-10-CM

## 2020-10-16 DIAGNOSIS — G8929 Other chronic pain: Secondary | ICD-10-CM | POA: Diagnosis not present

## 2020-10-16 MED ORDER — OMEPRAZOLE 20 MG PO CPDR: 20.0000 mg | DELAYED_RELEASE_CAPSULE | Freq: Every day | ORAL | 2 refills | Status: DC

## 2020-10-16 NOTE — Assessment & Plan Note (Signed)
Likely arthritis.  We will obtain an x-ray to evaluate for underlying causes.
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
## 2020-10-16 NOTE — Assessment & Plan Note (Signed)
Well-controlled.  Continue omeprazole 20 mg once daily.  Refill given.

## 2020-10-19 ENCOUNTER — Other Ambulatory Visit: Payer: PPO

## 2020-10-19 ENCOUNTER — Other Ambulatory Visit: Payer: Self-pay

## 2020-10-19 ENCOUNTER — Ambulatory Visit (INDEPENDENT_AMBULATORY_CARE_PROVIDER_SITE_OTHER): Payer: PPO

## 2020-10-19 DIAGNOSIS — M542 Cervicalgia: Secondary | ICD-10-CM

## 2020-10-19 DIAGNOSIS — G8929 Other chronic pain: Secondary | ICD-10-CM

## 2020-10-28 ENCOUNTER — Ambulatory Visit (INDEPENDENT_AMBULATORY_CARE_PROVIDER_SITE_OTHER): Payer: PPO | Admitting: Psychiatry

## 2020-10-28 ENCOUNTER — Other Ambulatory Visit: Payer: Self-pay

## 2020-10-28 ENCOUNTER — Encounter: Payer: Self-pay | Admitting: Psychiatry

## 2020-10-28 DIAGNOSIS — F319 Bipolar disorder, unspecified: Secondary | ICD-10-CM | POA: Diagnosis not present

## 2020-10-28 DIAGNOSIS — G250 Essential tremor: Secondary | ICD-10-CM

## 2020-10-28 DIAGNOSIS — F5105 Insomnia due to other mental disorder: Secondary | ICD-10-CM

## 2020-10-28 MED ORDER — QUETIAPINE FUMARATE 400 MG PO TABS: 800.0000 mg | ORAL_TABLET | Freq: Every day | ORAL | 3 refills | Status: DC

## 2020-10-28 MED ORDER — PAROXETINE HCL 40 MG PO TABS: 40.0000 mg | ORAL_TABLET | Freq: Every morning | ORAL | 3 refills | Status: DC

## 2020-10-28 MED ORDER — PRIMIDONE 50 MG PO TABS: 150.0000 mg | ORAL_TABLET | Freq: Every day | ORAL | 3 refills | Status: DC
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
## 2020-11-13 ENCOUNTER — Other Ambulatory Visit: Payer: PPO

## 2020-11-13 ENCOUNTER — Ambulatory Visit: Payer: PPO | Admitting: Oncology

## 2020-11-24 ENCOUNTER — Other Ambulatory Visit: Payer: Self-pay | Admitting: Family Medicine

## 2020-11-24 DIAGNOSIS — K219 Gastro-esophageal reflux disease without esophagitis: Secondary | ICD-10-CM

## 2020-11-25 ENCOUNTER — Other Ambulatory Visit: Payer: Self-pay | Admitting: Family Medicine

## 2020-11-25 DIAGNOSIS — K219 Gastro-esophageal reflux disease without esophagitis: Secondary | ICD-10-CM

## 2020-12-21 ENCOUNTER — Telehealth: Payer: Self-pay | Admitting: Family Medicine

## 2020-12-21 NOTE — Telephone Encounter (Signed)
Patient dropped off handicapp renewal form. Form is up front in Dr. Ellen Henri color folder. Please call patient when ready to pick up.

## 2020-12-23 NOTE — Telephone Encounter (Signed)
Form has been received and placed in Dr. Tharon Aquas To Be Signed tray.

## 2020-12-28 NOTE — Telephone Encounter (Signed)
See my message below 

## 2020-12-28 NOTE — Telephone Encounter (Signed)
Signed.  Please fill in our information and make available for pickup.

## 2020-12-30 NOTE — Telephone Encounter (Signed)
I called the patient and informed him that his handicap form is ready for pickup.  Rodney Branch,cma

## 2021-02-22 ENCOUNTER — Other Ambulatory Visit: Payer: Self-pay | Admitting: Family Medicine

## 2021-02-22 DIAGNOSIS — K219 Gastro-esophageal reflux disease without esophagitis: Secondary | ICD-10-CM

## 2021-04-22 ENCOUNTER — Other Ambulatory Visit: Payer: Self-pay

## 2021-04-30 ENCOUNTER — Encounter: Payer: Self-pay | Admitting: Family Medicine

## 2021-04-30 ENCOUNTER — Other Ambulatory Visit: Payer: Self-pay

## 2021-04-30 ENCOUNTER — Ambulatory Visit (INDEPENDENT_AMBULATORY_CARE_PROVIDER_SITE_OTHER): Payer: PPO | Admitting: Family Medicine

## 2021-04-30 VITALS — BP 110/70 | HR 101 | Temp 98.2°F | Ht 67.5 in | Wt 141.6 lb

## 2021-04-30 DIAGNOSIS — E782 Mixed hyperlipidemia: Secondary | ICD-10-CM | POA: Diagnosis not present

## 2021-04-30 DIAGNOSIS — Z0000 Encounter for general adult medical examination without abnormal findings: Secondary | ICD-10-CM | POA: Diagnosis not present

## 2021-04-30 DIAGNOSIS — D709 Neutropenia, unspecified: Secondary | ICD-10-CM

## 2021-04-30 DIAGNOSIS — Z85820 Personal history of malignant melanoma of skin: Secondary | ICD-10-CM

## 2021-04-30 LAB — CBC WITH DIFFERENTIAL/PLATELET
Basophils Absolute: 0 10*3/uL (ref 0.0–0.1)
Basophils Relative: 0.6 % (ref 0.0–3.0)
Eosinophils Absolute: 0.1 10*3/uL (ref 0.0–0.7)
Eosinophils Relative: 2.1 % (ref 0.0–5.0)
HCT: 37.4 % — ABNORMAL LOW (ref 39.0–52.0)
Hemoglobin: 12.4 g/dL — ABNORMAL LOW (ref 13.0–17.0)
Lymphocytes Relative: 18.8 % (ref 12.0–46.0)
Lymphs Abs: 0.6 10*3/uL — ABNORMAL LOW (ref 0.7–4.0)
MCHC: 33.3 g/dL (ref 30.0–36.0)
MCV: 90.7 fl (ref 78.0–100.0)
Monocytes Absolute: 0.4 10*3/uL (ref 0.1–1.0)
Monocytes Relative: 12.4 % — ABNORMAL HIGH (ref 3.0–12.0)
Neutro Abs: 2.1 10*3/uL (ref 1.4–7.7)
Neutrophils Relative %: 66.1 % (ref 43.0–77.0)
Platelets: 214 10*3/uL (ref 150.0–400.0)
RBC: 4.12 Mil/uL — ABNORMAL LOW (ref 4.22–5.81)
RDW: 15.3 % (ref 11.5–15.5)
WBC: 3.1 10*3/uL — ABNORMAL LOW (ref 4.0–10.5)

## 2021-04-30 NOTE — Assessment & Plan Note (Signed)
Given his age and lack of cardiovascular history there is no reason for further lipid panels at this time.

## 2021-04-30 NOTE — Progress Notes (Signed)
[name], MD Phone: [phone]  Rodney Branch is a 87 y.o. male who presents today for CPE.  Diet: Pasta, chicken, corn, potatoes, some green beans and peas, no soda or sweet tea Exercise: Exercise is limited by arthritis though he does try to walk each day. Colonoscopy: Aged out Prostate cancer screening: Aged out Family history-  Prostate cancer: No  Colon cancer: No Vaccines-   Flu: Out of season  Tetanus: Up-to-date  Shingles: Due  COVID19: Has received 1 booster  Pneumonia: Up-to-date Tobacco use: No Alcohol use: No Illicit Drug use: No Dentist: Yes Ophthalmology: Yes The patient has a difficult time hearing.  He has had this checked previously and declines hearing aids. Reports some memory difficulty with forgetting what he went into a room for.  He notes no other issues with his memory.   Active Ambulatory Problems    Diagnosis Date Noted  . Mood disorder ([place]) 12/24/2007  . Benign familial tremor 01/01/2008  . GERD 05/20/2009  . BPH without obstruction/lower urinary tract symptoms 12/24/2007  . Knee osteoarthritis 11/11/2014  . Constipation 05/14/2015  . History of melanoma 03/24/2016  . Mixed hyperlipidemia 11/15/2016  . Normocytic anemia 11/15/2016  . Tremor 10/19/2018  . Neutropenia ([place]) 04/24/2020  . Chronic neck pain 10/16/2020  . Routine general medical examination at a health care facility 04/30/2021   Resolved Ambulatory Problems    Diagnosis Date Noted  . Anxiety state 12/11/2007  . INSOMNIA, CHRONIC 05/20/2009  . HYPERTENSION 12/24/2007  . DUPUYTREN'S CONTRACTURE 07/12/2010  . SYNCOPE 05/20/2009  . Other abnormal glucose 12/21/2007  . ALLERGY 12/29/2008  . Medicare annual wellness visit, subsequent 04/04/2011  . Visual hallucinations 09/22/2011  . Dementia ([place]) 09/22/2011  . Sinusitis, acute 09/24/2012  . Arthritis of knee 11/11/2014  . Memory deficit 12/02/2014  . Rectal bleeding 04/24/2015  . Fecal impaction ([place]) 04/24/2015   . Benign fibroma of prostate 03/24/2016  . Clinical depression 12/11/2014  . Essential hypertension 03/24/2016  . Decreased potassium in the blood 12/16/2014  . Encounter to establish care 03/24/2016  . Chronic rhinitis 11/15/2016   Past Medical History:  Diagnosis Date  . Arthritis   . Benign prostatic hypertrophy 1996  . Depression 1996  . GERD (gastroesophageal reflux disease)   . Malignant melanoma ([place]) 2015  . Psychiatric hospitalization 12/1994, 11/2014  . Tremor, essential     Family History  Problem Relation Age of Onset  . Depression Mother 29       alzheimers//long history  . Alzheimer's disease Mother   . Cancer Father 57       cancer of stomach  . Alcohol abuse Neg Hx   . Diabetes Neg Hx   . Stroke Neg Hx   . CAD Neg Hx     Social History   Socioeconomic History  . Marital status: Married    Spouse name: Not on file  . Number of children: 3  . Years of education: Not on file  . Highest education level: Not on file  Occupational History  . Occupation: Research scientist (life sciences)    Comment: Retired  . 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 Smoking status: Never Smoker  . Smokeless tobacco: Never Used  Vaping Use  . Vaping Use: Never used  Substance and Sexual Activity  . Alcohol use: Not Currently    Alcohol/week: 0.0 standard drinks  . Drug use: No  . Sexual activity: Not on file  Other Topics  Concern  . Not on file  Social History Narrative   Has living will   Lives with wife in [place]   Wife, then son [name], is his health care POA   Would accept resuscitation attempts but no prolonged ventilation   Would consider feeding tube   Occupation: retired, was Research scientist (life sciences)   Activity: bowling 3x/wk   Diet: good water, fruits/vegetables daily      [name] local son: [name] (and wife [name])- [phone]   [name] son in [place]: [name]- [phone]   Social Determinants of Health   Financial Resource Strain: Ruth   .  Difficulty of Paying Living Expenses: Not hard at all  Food Insecurity: No Food Insecurity  . Worried About Charity fundraiser in the Last Year: Never true  . Ran Out of Food in the Last Year: Never true  Transportation Needs: No Transportation Needs  . Lack of Transportation (Medical): No  . Lack of Transportation (Non-Medical): No  Physical Activity: Not on file  Stress: No Stress Concern Present  . Feeling of Stress : Not at all  Social Connections: Unknown  . Frequency of Communication with Friends and Family: Not on file  . Frequency of Social Gatherings with Friends and Family: Not on file  . Attends Religious Services: Not on file  . Active Member of Clubs or Organizations: Not on file  . Attends Archivist Meetings: Not on file  . Marital Status: Married  Human resources officer Violence: Not At Risk  . Fear of Current or Ex-Partner: No  . Emotionally Abused: No  . Physically Abused: No  . Sexually Abused: No    ROS  General:  Negative for nexplained weight loss, fever Skin: Negative for new or changing mole, sore that won't heal HEENT: Positive for trouble hearing, ringing in ears, negative for trouble seeing, mouth sores, hoarseness, change in voice, dysphagia. CV:  Negative for chest pain, dyspnea, edema, palpitations Resp: Negative for cough, dyspnea, hemoptysis GI: Positive for constipation, negative for nausea, vomiting, diarrhea, abdominal pain, melena, hematochezia. GU: Negative for dysuria, incontinence, urinary hesitance, hematuria, vaginal or penile discharge, polyuria, sexual difficulty, lumps in testicle or breasts MSK: Positive for left knee pain, negative for muscle cramps or aches, other joint pain or swelling Neuro: Negative for headaches, weakness, numbness, dizziness, passing out/fainting Psych: Positive for memory problems, negative for depression, anxiety  Objective  Physical Exam Vitals:   04/30/21 1007  BP: 110/70  Pulse: (!) 101  Temp:  98.2 F (36.8 C)  SpO2: 96%    BP Readings from Last 3 Encounters:  04/30/21 110/70  04/24/20 125/70  11/15/19 92/60   Wt Readings from Last 3 Encounters:  04/30/21 141 lb 9.6 oz (64.2 kg)  10/16/20 139 lb (63 kg)  07/03/20 139 lb (63 kg)    Physical Exam Constitutional:      General: He is not in acute distress.    Appearance: He is not diaphoretic.  HENT:     Head: Normocephalic and atraumatic.  Eyes:     Conjunctiva/sclera: Conjunctivae normal.     Pupils: Pupils are equal, round, and reactive to light.  Cardiovascular:     Rate and Rhythm: Normal rate and regular rhythm.     Heart sounds: Normal heart sounds.  Pulmonary:     Effort: Pulmonary effort is normal.     Breath sounds: Normal breath sounds.  Abdominal:     General: Bowel sounds are normal. There is no distension.  Palpations: Abdomen is soft.     Tenderness: There is no abdominal tenderness. There is no guarding or rebound.  Musculoskeletal:     Right lower leg: No edema.     Left lower leg: No edema.  Skin:    General: Skin is warm and dry.  Neurological:     Mental Status: He is alert.  Psychiatric:        Mood and Affect: Mood normal.   Numerous hyperpigmented skin lesions on his back   Assessment/Plan:   Problem List Items Addressed This Visit    History of melanoma    The patient reports he has not seen dermatology in a couple of years.  He saw Dr. Nehemiah Massed previously.  We will refer back to them.  I discussed the importance of seeing dermatology with a history of melanoma.      Relevant Orders   Ambulatory referral to Dermatology   Mixed hyperlipidemia    Given his age and lack of cardiovascular history there is no reason for further lipid panels at this time.      Neutropenia ([place])   Relevant Orders   CBC w/Diff   Comp Met (CMET)   Routine general medical examination at a health care facility - Primary    Physical exam completed.  Encouraged exercise as he is able to with his  arthritis.  Discussed adding in more vegetables to his diet.  Patient has aged out of prostate cancer and colon cancer screening.  I did encourage him to get the Shingrix vaccine.  I encouraged him to get the second COVID booster once he has been at least 4 months after his first [place] booster.  I did encourage him to consider hearing aids.  He has very minimal memory issues that are likely related to aging at this time.  Lab work as outlined.         Return in about 1 year (around 04/30/2022) for CPE.  This visit occurred during the SARS-CoV-2 public health emergency.  Safety protocols were in place, including screening questions prior to the visit, additional usage of staff PPE, and extensive cleaning of exam room while observing appropriate contact time as indicated for disinfecting solutions.    [name], MD [place]

## 2021-04-30 NOTE — Assessment & Plan Note (Addendum)
The patient reports he has not seen dermatology in a couple of years.  He saw Dr. Nehemiah Massed previously.  We will refer back to them.  I discussed the importance of seeing dermatology with a history of melanoma.

## 2021-04-30 NOTE — Assessment & Plan Note (Signed)
Physical exam completed.  Encouraged exercise as he is able to with his arthritis.  Discussed adding in more vegetables to his diet.  Patient has aged out of prostate cancer and colon cancer screening.  I did encourage him to get the Shingrix vaccine.  I encouraged him to get the second COVID booster once he has been at least 4 months after his first [place] booster.  I did encourage him to consider hearing aids.  He has very minimal memory issues that are likely related to aging at this time.  Lab work as outlined.

## 2021-04-30 NOTE — Patient Instructions (Signed)
Nice to see you. We will get lab work today. Please try to add in more vegetables he is try to walk for exercise.

## 2021-05-03 LAB — COMPREHENSIVE METABOLIC PANEL
ALT: 9 U/L (ref 0–53)
AST: 13 U/L (ref 0–37)
Albumin: 4 g/dL (ref 3.5–5.2)
Alkaline Phosphatase: 93 U/L (ref 39–117)
BUN: 23 mg/dL (ref 6–23)
CO2: 26 mEq/L (ref 19–32)
Calcium: 8.8 mg/dL (ref 8.4–10.5)
Chloride: 106 mEq/L (ref 96–112)
Creatinine, Ser: 1.02 mg/dL (ref 0.40–1.50)
GFR: 65.84 mL/min (ref >60.00)
Glucose, Bld: 105 mg/dL — ABNORMAL HIGH (ref 70–99)
Potassium: 3.7 mEq/L (ref 3.5–5.1)
Sodium: 141 mEq/L (ref 135–145)
Total Bilirubin: 0.3 mg/dL (ref 0.2–1.2)
Total Protein: 6.3 g/dL (ref 6.0–8.3)

## 2021-05-11 NOTE — Progress Notes (Signed)
Left message for patient to return call back for lab results.

## 2021-06-15 ENCOUNTER — Other Ambulatory Visit: Payer: Self-pay | Admitting: Family Medicine

## 2021-06-15 DIAGNOSIS — N400 Enlarged prostate without lower urinary tract symptoms: Secondary | ICD-10-CM

## 2021-07-06 ENCOUNTER — Ambulatory Visit (INDEPENDENT_AMBULATORY_CARE_PROVIDER_SITE_OTHER): Payer: PPO

## 2021-07-06 VITALS — Ht 67.5 in | Wt 141.0 lb

## 2021-07-06 DIAGNOSIS — Z0000 Encounter for general adult medical examination without abnormal findings: Secondary | ICD-10-CM | POA: Diagnosis not present
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
## 2021-07-06 NOTE — Patient Instructions (Addendum)
  Mr. Accomando , Thank you for taking time to come for your Medicare Wellness Visit. I appreciate your ongoing commitment to your health goals. Please review the following plan we discussed and let me know if I can assist you in the future.   These are the goals we discussed:  Goals       Patient Stated     Increase physical activity (pt-stated)      Continue to walk for exercise to the mailbox         This is a list of the screening recommended for you and due dates:  Health Maintenance  Topic Date Due   COVID-19 Vaccine (3 - Pfizer risk series) 07/22/2021*   Flu Shot  08/22/2021*   Zoster (Shingles) Vaccine (1 of 2) 10/06/2021*   Tetanus Vaccine  08/29/2028   Pneumonia vaccines  Completed   HPV Vaccine  Aged Out  *Topic was postponed. The date shown is not the original due date.

## 2021-07-19 ENCOUNTER — Other Ambulatory Visit: Payer: Self-pay

## 2021-07-19 ENCOUNTER — Encounter: Payer: Self-pay | Admitting: Family Medicine

## 2021-07-19 ENCOUNTER — Ambulatory Visit (INDEPENDENT_AMBULATORY_CARE_PROVIDER_SITE_OTHER): Payer: PPO | Admitting: Family Medicine

## 2021-07-19 DIAGNOSIS — G8929 Other chronic pain: Secondary | ICD-10-CM

## 2021-07-19 DIAGNOSIS — M542 Cervicalgia: Secondary | ICD-10-CM | POA: Diagnosis not present

## 2021-07-19 MED ORDER — MELOXICAM 7.5 MG PO TABS: 7.5000 mg | ORAL_TABLET | Freq: Every day | ORAL | 0 refills | Status: DC

## 2021-07-19 NOTE — Progress Notes (Signed)
[name], MD Phone: [phone]  Rodney Branch is a 87 y.o. male who presents today for same-day visit.  Chronic neck pain: This is an ongoing issue though it has worsened recently.  It bothers him a couple days a week.  Currently he has no neck pain.  Notes if he lifts something it seems to get worse.  It is on the right side.  He has been taking Advil and Tylenol intermittently and using a heating pad.  No radiation, numbness, or weakness.  Social History   Tobacco Use  Smoking Status Never  Smokeless Tobacco Never    Current Outpatient Medications on File Prior to Visit  Medication Sig Dispense Refill   Cholecalciferol (VITAMIN D) 50 MCG (2000 UT) CAPS Take 1 capsule by mouth daily.     doxazosin (CARDURA) 8 MG tablet Take 1 tablet by mouth once daily 90 tablet 0   FLUAD QUADRIVALENT 0.5 ML injection      omeprazole (PRILOSEC) 20 MG capsule Take 1 capsule by mouth once daily 90 capsule 0   PARoxetine (PAXIL) 40 MG tablet Take 1 tablet (40 mg total) by mouth every morning. 90 tablet 3   polyethylene glycol powder (GLYCOLAX/MIRALAX) 17 GM/SCOOP powder Take 17 g by mouth daily as needed. 850 g 0   primidone (MYSOLINE) 50 MG tablet Take 3 tablets (150 mg total) by mouth at bedtime. 270 tablet 3   QUEtiapine (SEROQUEL) 400 MG tablet Take 2 tablets (800 mg total) by mouth at bedtime. 180 tablet 3   vitamin B-12 (CYANOCOBALAMIN) 1000 MCG tablet Take 1,000 mcg by mouth daily.     No current facility-administered medications on file prior to visit.     ROS see history of present illness  Objective  Physical Exam Vitals:   07/19/21 1601  BP: 130/80  Pulse: 83  Temp: 98.9 F (37.2 C)  SpO2: 98%    BP Readings from Last 3 Encounters:  07/19/21 130/80  04/30/21 110/70  04/24/20 125/70   Wt Readings from Last 3 Encounters:  07/19/21 139 lb 9.6 oz (63.3 kg)  07/06/21 141 lb (64 kg)  04/30/21 141 lb 9.6 oz (64.2 kg)    Physical Exam Constitutional:      General:  He is not in acute distress.    Appearance: He is not diaphoretic.  Pulmonary:     Effort: Pulmonary effort is normal.  Musculoskeletal:     Comments: No midline spine tenderness, no midline spine step-off, no muscular neck tenderness  Neurological:     Mental Status: He is alert.     Comments: 5/5 strength in bilateral biceps, triceps, grip, quads, hamstrings, plantar and dorsiflexion, sensation to light touch intact in bilateral UE and LE     Assessment/Plan: Please see individual problem list.  Problem List Items Addressed This Visit     Chronic neck pain    This is related to arthritis.  Prior x-ray with degenerative changes noted.  We will treat with meloxicam to use on an as-needed basis.  If he needs this on a daily basis he will let us know and we can refer to pain management to consider injections.  He will have his kidney function checked in 3 weeks.  I did discuss the risk to his kidneys with persistent use of meloxicam.  He will take the meloxicam with food.  If he has an upset stomach with this he will let us know.      Relevant Medications   meloxicam (MOBIC) 7.5  MG tablet   Other Relevant Orders   Basic Metabolic Panel (BMET)    Return in about 3 weeks (around 08/09/2021) for Labs, 3 months with PCP to follow-up on chronic neck pain.  This visit occurred during the SARS-CoV-2 public health emergency.  Safety protocols were in place, including screening questions prior to the visit, additional usage of staff PPE, and extensive cleaning of exam room while observing appropriate contact time as indicated for disinfecting solutions.    [name], MD [place]

## 2021-07-19 NOTE — Patient Instructions (Signed)
Nice to see you. Please take the meloxicam only if needed to help with your neck pain. You need to take this with food.  If it upsets your stomach you need to let us know. We will see you back in 3 weeks for labs to check your kidney function.

## 2021-07-19 NOTE — Assessment & Plan Note (Addendum)
This is related to arthritis.  Prior x-ray with degenerative changes noted.  We will treat with meloxicam to use on an as-needed basis.  If he needs this on a daily basis he will let us know and we can refer to pain management to consider injections.  He will have his kidney function checked in 3 weeks.  I did discuss the risk to his kidneys with persistent use of meloxicam.  He will take the meloxicam with food.  If he has an upset stomach with this he will let us know.

## 2021-08-09 ENCOUNTER — Other Ambulatory Visit: Payer: PPO

## 2021-09-01 ENCOUNTER — Ambulatory Visit: Payer: PPO | Admitting: Dermatology

## 2021-09-01 ENCOUNTER — Other Ambulatory Visit: Payer: Self-pay

## 2021-09-01 ENCOUNTER — Encounter: Payer: Self-pay | Admitting: Dermatology

## 2021-09-01 DIAGNOSIS — Z85820 Personal history of malignant melanoma of skin: Secondary | ICD-10-CM | POA: Diagnosis not present

## 2021-09-01 DIAGNOSIS — C4359 Malignant melanoma of other part of trunk: Secondary | ICD-10-CM | POA: Diagnosis not present

## 2021-09-01 DIAGNOSIS — D492 Neoplasm of unspecified behavior of bone, soft tissue, and skin: Secondary | ICD-10-CM

## 2021-09-01 DIAGNOSIS — L578 Other skin changes due to chronic exposure to nonionizing radiation: Secondary | ICD-10-CM | POA: Diagnosis not present

## 2021-09-01 NOTE — Patient Instructions (Addendum)
Wound Care Instructions  Cleanse wound gently with soap and water once a day then pat dry with clean gauze. Apply a thing coat of Petrolatum (petroleum jelly, "Vaseline") over the wound (unless you have an allergy to this). We recommend that you use a new, sterile tube of Vaseline. Do not pick or remove scabs. Do not remove the yellow or white "healing tissue" from the base of the wound.  Cover the wound with fresh, clean, nonstick gauze and secure with paper tape. You may use Band-Aids in place of gauze and tape if the would is small enough, but would recommend trimming much of the tape off as there is often too much. Sometimes Band-Aids can irritate the skin.  You should call the office for your biopsy report after 1 week if you have not already been contacted.  If you experience any problems, such as abnormal amounts of bleeding, swelling, significant bruising, significant pain, or evidence of infection, please call the office immediately.  FOR ADULT SURGERY PATIENTS: If you need something for pain relief you may take 1 extra strength Tylenol (acetaminophen) AND 2 Ibuprofen (200mg each) together every 4 hours as needed for pain. (do not take these if you are allergic to them or if you have a reason you should not take them.) Typically, you may only need pain medication for 1 to 3 days.   If you have any questions or concerns for your doctor, please call our main line at [phone] and press option 4 to reach your doctor's medical assistant. If no one answers, please leave a voicemail as directed and we will return your call as soon as possible. Messages left after 4 pm will be answered the following business day.   You may also send us a message via MyChart. We typically respond to MyChart messages within 1-2 business days.  For prescription refills, please ask your pharmacy to contact our office. Our fax number is [phone].  If you have an urgent issue when the clinic is closed that  cannot wait until the next business day, you can page your doctor at the number below.    Please note that while we do our best to be available for urgent issues outside of office hours, we are not available 24/7.   If you have an urgent issue and are unable to reach us, you may choose to seek medical care at your doctor's office, retail clinic, urgent care center, or emergency room.  If you have a medical emergency, please immediately call 911 or go to the emergency department.  Pager Numbers  - Dr. Kowalski: [phone]  - Dr. Moye: [phone]  - Dr. Stewart: [phone]  In the event of inclement weather, please call our main line at [phone] for an update on the status of any delays or closures.  Dermatology Medication Tips: Please keep the boxes that topical medications come in in order to help keep track of the instructions about where and how to use these. Pharmacies typically print the medication instructions only on the boxes and not directly on the medication tubes.   If your medication is too expensive, please contact our office at [phone] option 4 or send us a message through MyChart.   We are unable to tell what your co-pay for medications will be in advance as this is different depending on your insurance coverage. However, we may be able to find a substitute medication at lower cost or fill out paperwork to get insurance to cover a needed   medication.   If a prior authorization is required to get your medication covered by your insurance company, please allow us 1-2 business days to complete this process.  Drug prices often vary depending on where the prescription is filled and some pharmacies may offer cheaper prices.  The website www.goodrx.com contains coupons for medications through different pharmacies. The prices here do not account for what the cost may be with help from insurance (it may be cheaper with your insurance), but the website can give you the  price if you did not use any insurance.  - You can print the associated coupon and take it with your prescription to the pharmacy.  - You may also stop by our office during regular business hours and pick up a GoodRx coupon card.  - If you need your prescription sent electronically to a different pharmacy, notify our office through [place] MyChart or by phone at [phone] option 4.   

## 2021-09-01 NOTE — Progress Notes (Signed)
   New Patient Visit  Subjective  Rodney Branch is a 87 y.o. male who presents for the following: lesion (Dur: couple of months. Lesion on right upper back. Pink and gray. Non tender. Denies itch. Hx MM on right abdomen 2015.).  Wife with patient.  Referred by Dr. [name].  Objective  Well appearing patient in no apparent distress; mood and affect are within normal limits.  Review of Systems: No other skin or systemic complaints except as noted in HPI or Assessment and Plan.   A focused examination was performed including face and back. Relevant physical exam findings are noted in the Assessment and Plan.  Right Upper Back 1.2 cm Firm linear red plaque     Assessment & Plan  Neoplasm of skin Right Upper Back  Skin / nail biopsy Type of biopsy: tangential   Informed consent: discussed and consent obtained   Timeout: patient name, date of birth, surgical site, and procedure verified   Procedure prep:  Patient was prepped and draped in usual sterile fashion Prep type:  Isopropyl alcohol Anesthesia: the lesion was anesthetized in a standard fashion   Anesthetic:  1% lidocaine w/ epinephrine 1-100,000 buffered w/ 8.4% NaHCO3 Instrument used: flexible razor blade   Hemostasis achieved with: pressure, aluminum chloride and electrodesiccation   Outcome: patient tolerated procedure well   Post-procedure details: sterile dressing applied and wound care instructions given   Dressing type: bandage and petrolatum    Specimen 1 - Surgical pathology Differential Diagnosis: R/O BCC vs scar  Check Margins: No  Okay to text wife or leave voicemail with results. [phone].  Return for pending pathology results.  Actinic Damage - chronic, secondary to cumulative UV radiation exposure/sun exposure over time - diffuse scaly erythematous macules with underlying dyspigmentation - Recommend daily broad spectrum sunscreen SPF 30+ to sun-exposed areas, reapply every 2 hours as  needed.  - Recommend staying in the shade or wearing long sleeves, sun glasses (UVA+UVB protection) and wide brim hats (4-inch brim around the entire circumference of the hat). - Call for new or changing lesions.  History of Melanoma right abdomen in 2015 - No evidence of recurrence today - Recommend regular full body skin exams - Recommend daily broad spectrum sunscreen SPF 30+ to sun-exposed areas, reapply every 2 hours as needed.  - Call if any new or changing lesions are noted between office visits  I, Emelia Salisbury, CMA, am acting as scribe for Sarina Ser, MD. Documentation: I have reviewed the above documentation for accuracy and completeness, and I agree with the above.  Sarina Ser, MD

## 2021-09-07 ENCOUNTER — Telehealth: Payer: Self-pay

## 2021-09-07 ENCOUNTER — Encounter: Payer: Self-pay | Admitting: Dermatology

## 2021-09-07 NOTE — Telephone Encounter (Signed)
Called pt wife, scheduled pt to come into the office tomorrow Oct 12 at 2:30 to discuss biopsy results and treatment plan

## 2021-09-07 NOTE — Telephone Encounter (Signed)
-----   Message from Ralene Bathe, MD sent at 09/07/2021  2:35 PM EDT ----- Diagnosis Skin , right upper back MALIGNANT MELANOMA MELANOMA TABLE (AJCC 8TH EDITION#) PROCEDURE: SHAVE SPECIMEN ANATOMIC SITE: RIGHT UPPER BACK HISTOLOGIC TYPE: NODULAR (FOCAL JUNCTIONAL COMPONENT) BRESLOW'S DEPTH/MAXIMUM TUMOR THICKNESS: 1.6MM CLARK/ANATOMIC LEVEL: IV MARGINS PERIPHERAL MARGINS: FREE DEEP MARGIN: INVOLVED ULCERATION: ABSENT SATELLITOSIS: ABSENT MITOTIC INDEX: 5/MM2 LYMPHO-VASCULAR INVASION: ABSENT NEUROTROPISM: ABSENT TUMOR-INFILTRATING LYMPHOCYTES: BRISK TUMOR REGRESSION: FOCAL, PARTIAL (SEE DESCRIPTION) LYMPH NODES (IF APPLICABLE): N/A PATHOLOGIC STAGE: PT2A COMMENT: A COMPLETE RE-EXCISION IS RECOMMENDED. PLEASE SEE DESCRIPTION. #  Malignant melanoma - invasive Breslow 1.6 mm Mitoses 5 per square millimeter Needs Wide local excision and Lymphoscintigraphy and Sentinel Lymph node biopsy Recommend [facility] Melanoma clinic referral - Dr Molli Hazard al.  Bonne Dolores to call pt several times and went to voicemail. Please contact pt and advise and schedule to come for discussion ASAP

## 2021-09-08 ENCOUNTER — Encounter: Payer: Self-pay | Admitting: Dermatology

## 2021-09-08 ENCOUNTER — Other Ambulatory Visit: Payer: Self-pay

## 2021-09-08 ENCOUNTER — Ambulatory Visit: Payer: PPO | Admitting: Dermatology

## 2021-09-08 DIAGNOSIS — C4359 Malignant melanoma of other part of trunk: Secondary | ICD-10-CM | POA: Diagnosis not present

## 2021-09-08 DIAGNOSIS — C439 Malignant melanoma of skin, unspecified: Secondary | ICD-10-CM

## 2021-09-08 NOTE — Patient Instructions (Signed)

## 2021-09-08 NOTE — Progress Notes (Signed)
   Follow-Up Visit   Subjective  Rodney Branch is a 87 y.o. male who presents for the following: discuss pathology results (Bx proven MM - patient here today to discuss pathology results and treatment options.).  The following portions of the chart were reviewed this encounter and updated as appropriate:   Tobacco  Allergies  Meds  Problems  Med Hx  Surg Hx  Fam Hx     Review of Systems:  No other skin or systemic complaints except as noted in HPI or Assessment and Plan.  Objective  Well appearing patient in no apparent distress; mood and affect are within normal limits.  A focused examination was performed including the back. Relevant physical exam findings are noted in the Assessment and Plan.  R upper back Healing biopsy site.   Assessment & Plan  Melanoma of skin ([place]) R upper back  Bx proven MM - Malignant melanoma - invasive Breslow 1.6 mm Mitoses 5 per square millimeter  Needs Wide local excision and Lymphoscintigraphy and Sentinel Lymph node biopsy Recommend [facility] Melanoma clinic referral - Dr Molli Hazard al.  Discussed diagnosis in detail including significance of melanoma diagnosis which can be potentially lethal.  Discussed treatment recommendations in detail advising that treatment recommendations are based on longitudinal studies and retrospective studies and are nationwide protocols.  Advised there is always potential for recurrence even after definitive treatment.  After definitive treatment, we recommend total-body skin exams every 3 months for a year; then every 4 months for a year; then every 6 months for 3 years.  At 5 years post treatment, if all looks good we would recommend at least yearly total-body skin exams for the rest of your life.  The patient was given time for questions and these were answered.  We recommend frequent self skin examinations; photoprotection with sunscreen, sun protective clothing, hats, sunglasses and sun avoidance.  If the patient  notices any new or changing skin lesions the patient should return to the office immediately for evaluation.   Related Procedures Ambulatory referral to Surgical Oncology  Return in about 3 months (around 12/09/2021) for TBSE.  [name], CMA, am acting as scribe for Sarina Ser, MD . Documentation: I have reviewed the above documentation for accuracy and completeness, and I agree with the above.  Sarina Ser, MD

## 2021-09-16 ENCOUNTER — Other Ambulatory Visit: Payer: Self-pay | Admitting: Family Medicine

## 2021-09-16 DIAGNOSIS — N400 Enlarged prostate without lower urinary tract symptoms: Secondary | ICD-10-CM

## 2021-09-17 ENCOUNTER — Telehealth: Payer: Self-pay | Admitting: Family Medicine

## 2021-09-17 NOTE — Telephone Encounter (Signed)
The cardura was already sent to the pharmacy. He needs to call Dr Alveria Apley office regarding the surgery referral as they placed this and should be able to see why he has not heard from the surgeon yet.

## 2021-09-17 NOTE — Telephone Encounter (Signed)
Patients wife and patient has been notified and number has been given to contact [facility] office.

## 2021-09-17 NOTE — Telephone Encounter (Signed)
The patient went to Dr. Nehemiah Massed informed the patient that he had waited to long and he needs to see a surgeon. They waited to see when the surgeon would call they have not heard from the surgeon's office . They are concerned that the cancer would spread if they wait any longer. They have waited a week and 3 days with no response. Dr. Nehemiah Massed wanted them to get a surgeon right away.    Refill needed on his doxazosin (CARDURA) 8 MG tablet

## 2021-09-28 DIAGNOSIS — D485 Neoplasm of uncertain behavior of skin: Secondary | ICD-10-CM | POA: Diagnosis not present

## 2021-09-29 DIAGNOSIS — C4359 Malignant melanoma of other part of trunk: Secondary | ICD-10-CM | POA: Diagnosis not present

## 2021-09-30 ENCOUNTER — Telehealth: Payer: Self-pay | Admitting: Family Medicine

## 2021-09-30 NOTE — Telephone Encounter (Signed)
Please cancel request below. Patient's wife explained to patient that Dr Caryl Bis can not fill those prescriptions.

## 2021-09-30 NOTE — Telephone Encounter (Signed)
Patient came into the office and would like Dr Caryl Bis  to start filling his QUEtiapine (SEROQUEL) 400 MG tablet, primidone (MYSOLINE) 50 MG tablet, and PARoxetine (PAXIL) 40 MG tablet. Patient does not want to drive to [facility] to have them filled. He states that he will soon need a refill on his PARoxetine (PAXIL) 40 MG tablet. At this time he is good with the other medications.

## 2021-09-30 NOTE — Telephone Encounter (Signed)
noted 

## 2021-10-27 ENCOUNTER — Encounter: Payer: Self-pay | Admitting: Family Medicine

## 2021-10-27 ENCOUNTER — Other Ambulatory Visit: Payer: Self-pay

## 2021-10-27 ENCOUNTER — Ambulatory Visit (INDEPENDENT_AMBULATORY_CARE_PROVIDER_SITE_OTHER): Payer: PPO | Admitting: Family Medicine

## 2021-10-27 DIAGNOSIS — C439 Malignant melanoma of skin, unspecified: Secondary | ICD-10-CM | POA: Diagnosis not present

## 2021-10-27 DIAGNOSIS — G8929 Other chronic pain: Secondary | ICD-10-CM | POA: Diagnosis not present

## 2021-10-27 DIAGNOSIS — M542 Cervicalgia: Secondary | ICD-10-CM

## 2021-10-27 NOTE — Assessment & Plan Note (Signed)
The patient will keep his appointment for his procedure tomorrow.  I encouraged him to discuss follow-up plans with the surgeon while they are at their visit tomorrow.  The surgeon should be able to clarify whether or not the lymph node follow-up will be based on exam or further imaging studies.  I also discussed that they would likely send the tissue off for pathology to review to evaluate for margins.
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
## 2021-10-27 NOTE — Assessment & Plan Note (Signed)
Chronic issue.  Generally stable.  He only occasionally uses Advil 400 mg once daily.  Discussed that would be okay to continue and he could continue to use a heating pad.  He will monitor.

## 2021-10-28 ENCOUNTER — Ambulatory Visit: Payer: PPO | Admitting: Psychiatry

## 2021-10-28 DIAGNOSIS — C439 Malignant melanoma of skin, unspecified: Secondary | ICD-10-CM | POA: Diagnosis not present

## 2021-10-28 DIAGNOSIS — C4359 Malignant melanoma of other part of trunk: Secondary | ICD-10-CM | POA: Diagnosis not present

## 2021-11-02 ENCOUNTER — Telehealth: Payer: Self-pay | Admitting: Family Medicine

## 2021-11-02 NOTE — Telephone Encounter (Signed)
Patient came into office wanted Dr Caryl Bis fill out renew handicap form but does not have form with them

## 2021-11-04 NOTE — Telephone Encounter (Signed)
Patient came into office wanted Dr Caryl Bis fill out renew handicap form but does not have form with them  form was placed in the sign basket.  Ashya Nicolaisen,cma

## 2021-11-05 NOTE — Telephone Encounter (Signed)
Can you see if he is able to walk more than 200 feet with out stopping? Does he use a cane or a walker?

## 2021-11-05 NOTE — Telephone Encounter (Signed)
I called the patient and he stated he cannot walk more than 200 feet without stopping,  he has braces on his legs because of his knees.  He uses a cane sometimes.  Cristie Mckinney,cma

## 2021-11-05 NOTE — Telephone Encounter (Signed)
Signed. Please make available for pick up.  

## 2021-11-08 NOTE — Telephone Encounter (Signed)
O called and LVM informing the patient that his handicap application was available for pickup.  Jaquavius Hudler,cma

## 2021-11-09 ENCOUNTER — Encounter: Payer: Self-pay | Admitting: Family Medicine

## 2021-11-09 DIAGNOSIS — C4359 Malignant melanoma of other part of trunk: Secondary | ICD-10-CM | POA: Diagnosis not present

## 2021-11-09 NOTE — Telephone Encounter (Signed)
Patient already came and picked up his handicap form.  Kineta Fudala,cma

## 2021-11-23 ENCOUNTER — Other Ambulatory Visit: Payer: Self-pay | Admitting: Psychiatry

## 2021-11-23 DIAGNOSIS — F319 Bipolar disorder, unspecified: Secondary | ICD-10-CM

## 2021-11-24 NOTE — Telephone Encounter (Signed)
90 day ok?

## 2021-11-24 NOTE — Telephone Encounter (Signed)
[name] wife called requesting refill for Quetiapine 400 mg 2 tabs @ bedtime. @ [facility]. [address]

## 2021-11-30 ENCOUNTER — Telehealth: Payer: Self-pay | Admitting: Family Medicine

## 2021-11-30 NOTE — Telephone Encounter (Signed)
Patient had a handicapp renewal form completed by office on 11/02/2021. Patient came into office today and stated that DOT has not received his renewal form or check. Office does not have a copy of form. Another form needs to be signed. Form handed to [place].

## 2021-12-02 NOTE — Telephone Encounter (Signed)
I called and informed the patients wife that the handicap form was signed by another provider and it is at the front desk ready for pickup.  Goran Olden,cma

## 2021-12-08 ENCOUNTER — Ambulatory Visit (INDEPENDENT_AMBULATORY_CARE_PROVIDER_SITE_OTHER): Payer: PPO | Admitting: Psychiatry

## 2021-12-08 ENCOUNTER — Other Ambulatory Visit: Payer: Self-pay

## 2021-12-08 ENCOUNTER — Encounter: Payer: Self-pay | Admitting: Psychiatry

## 2021-12-08 DIAGNOSIS — G250 Essential tremor: Secondary | ICD-10-CM | POA: Diagnosis not present

## 2021-12-08 DIAGNOSIS — F319 Bipolar disorder, unspecified: Secondary | ICD-10-CM | POA: Diagnosis not present

## 2021-12-08 DIAGNOSIS — F5105 Insomnia due to other mental disorder: Secondary | ICD-10-CM

## 2021-12-08 MED ORDER — PRIMIDONE 50 MG PO TABS: 150.0000 mg | ORAL_TABLET | Freq: Every day | ORAL | 3 refills | Status: DC

## 2021-12-08 MED ORDER — PAROXETINE HCL 40 MG PO TABS: 40.0000 mg | ORAL_TABLET | Freq: Every morning | ORAL | 3 refills | Status: DC

## 2021-12-08 MED ORDER — QUETIAPINE FUMARATE 400 MG PO TABS: 800.0000 mg | ORAL_TABLET | Freq: Every day | ORAL | 3 refills | Status: DC

## 2021-12-08 NOTE — Progress Notes (Signed)
Rodney Branch [id number] August 27, 1931 88 y.o.  Subjective:   Patient ID:  Rodney Branch is a 88 y.o. (DOB Dec 06, 1930) male.  Chief Complaint:  Chief Complaint  Patient presents with   Follow-up    Bipolar I disorder ([place])    Medication Refill Associated symptoms include arthralgias and neck pain. Pertinent negatives include no weakness.  Rodney Branch presents to the office today for follow-up of bipolar disorder.   Seen with wife.  He reduced the paroxetine to 20 mg for 2 mos in 2019.  Felt worse, then increased back to 40 mg and he and his wife noticed.  Was also sleeping 2 much.  Cut out he wine was on 2 daily and falling asleep.  He stopped it too.  Don't even miss it.  Splits quetiapine 400 mg when goes to sleep and repeats when awakens and less daytime   seen December 2020.  No  Med changes.  10/28/20 appt with following noted: Very careful with Covid.  Mood has remained stable.  Sleep managed.  No mood swings. Works on [employer] and plays cards. Doing well with meds.  Staying asleep better with quetiapine at 800 mg HS instead of splitting pills. Mood great and anxiety under control.  Wife says he could be more patient.  No cycles of depression. Patient reports stable mood and denies depressed or irritable moodsand both he and his wife agree better with Paxil.[name]  Patient denies any recent difficulty with anxiety.  Patient denies difficulty with sleep initiation or maintenance. Denies appetite disturbance.  Patient reports that energy and motivation have been good.  Patient denies any difficulty with concentration.  Patient denies any suicidal ideation.  Wife agrees he's doing well. Tremor managed. Plan: Continue quetiapine 800 mg HS and paroxetine 40 mg daily.  12/08/2021 appointment with the following noted:  seen with wife Had melanoma on back that required extensive surgery.   Happy as hell.   Went off paroxetine in Dec 6 days and restarted.it.  Wife noticed he's  irritable. Neither noticed any mood problems. SE dryness Patient reports stable mood and denies depressed or irritable moods.  Patient denies any recent difficulty with anxiety.  Patient denies difficulty with sleep initiation or maintenance. Denies appetite disturbance.  Patient reports that energy and motivation have been good.  Patient denies any difficulty with concentration.  Patient denies any suicidal ideation.  Past Psychiatric Medication Trials:  Sertraline, paroxetine 40, quetiapine 800.  Olanzapine. Alprazolam.  Review of Systems:  Review of Systems  HENT:  Positive for rhinorrhea.   Cardiovascular:  Negative for palpitations.  Musculoskeletal:  Positive for arthralgias and neck pain. Negative for back pain.  Neurological:  Positive for tremors. Negative for weakness.       Falls unchanged  Psychiatric/Behavioral:  Negative for agitation, behavioral problems, confusion, decreased concentration, dysphoric mood, hallucinations, self-injury, sleep disturbance and suicidal ideas. The patient is not nervous/anxious and is not hyperactive.    Medications: I have reviewed the patient's current medications.  Current Outpatient Medications  Medication Sig Dispense Refill   doxazosin (CARDURA) 8 MG tablet Take 1 tablet by mouth once daily 90 tablet 1   FLUAD QUADRIVALENT 0.5 ML injection      omeprazole (PRILOSEC) 20 MG capsule Take 1 capsule by mouth once daily 90 capsule 0   polyethylene glycol powder (GLYCOLAX/MIRALAX) 17 GM/SCOOP powder Take 17 g by mouth daily as needed. 850 g 0   Cholecalciferol (VITAMIN D) 50 MCG (2000 UT) CAPS Take 1  capsule by mouth daily. (Patient not taking: Reported on 12/08/2021)     PARoxetine (PAXIL) 40 MG tablet Take 1 tablet (40 mg total) by mouth every morning. 90 tablet 3   primidone (MYSOLINE) 50 MG tablet Take 3 tablets (150 mg total) by mouth at bedtime. 270 tablet 3   QUEtiapine (SEROQUEL) 400 MG tablet Take 2 tablets (800 mg total) by mouth at  bedtime. 180 tablet 3   vitamin B-12 (CYANOCOBALAMIN) 1000 MCG tablet Take 1,000 mcg by mouth daily. (Patient not taking: Reported on 12/08/2021)     No current facility-administered medications for this visit.    Medication Side Effects: None except a little hangover   Allergies:  Allergies  Allergen Reactions   Metoprolol Tartrate Other (See Comments)    Hallucinations   Propranolol     Hallucinations (resolved upon cessation)    Past Medical History:  Diagnosis Date   Actinic keratosis 01/21/2016   R distal lat deltoid - bx proven   Arthritis    Benign prostatic hypertrophy 1996   Depression 1996   Psych Admit ([place]) 1996   Dysplastic nevus 09/16/2015   LUQA - moderate   Dysplastic nevus 09/16/2015   R side inf costal area - mod   Dysplastic nevus 02/15/2018   L upper back 4.0 cm lat to spine - mod   Dysplastic nevus 02/15/2018   R post flank sup - mod   Dysplastic nevus 02/15/2018   R post flank inf - mod to severe - excision 05/15/2018   Dysplastic nevus 02/15/2018   L post waistline/sacral - mod   Dysplastic nevus 09/12/2018   R lat periumbilical - mod   GERD (gastroesophageal reflux disease)    INSOMNIA, CHRONIC 05/20/2009   STOPPED AMBIEN 2/2 ABUSE   Malignant melanoma ([place]) 2015   R abd ([place])   Malignant melanoma ([place]) 09/01/2021   right upper back   Psychiatric hospitalization 12/1994, 11/2014   self referral/anxiety depression, IVC for irrational behavior   Tremor, essential     Family History  Problem Relation Age of Onset   Depression Mother 39       alzheimers//long history   Alzheimer's disease Mother    Cancer Father 14       cancer of stomach   Alcohol abuse Neg Hx    Diabetes Neg Hx    Stroke Neg Hx    CAD Neg Hx     Social History   Socioeconomic History   Marital status: Married    Spouse name: Not on file   Number of children: 3   Years of education: Not on file   Highest education level: Not on file  Occupational  History   Occupation: Research scientist (life sciences)    Comment: Retired   Occupation: [employer] course    Comment: retired also  Tobacco Use   Smoking status: Never   Smokeless tobacco: Never  Vaping Use   Vaping Use: Never used  Substance and Sexual Activity   Alcohol use: Not Currently    Alcohol/week: 0.0 standard drinks   Drug use: No   Sexual activity: Not on file  Other Topics Concern   Not on file  Social History Narrative   Has living will   Lives with wife in [place]   Wife, then son [name], is his health care POA   Would accept resuscitation attempts but no prolonged ventilation   Would consider feeding tube   Occupation: retired, was Research scientist (life sciences)   Activity: bowling  3x/wk   Diet: good water, fruits/vegetables daily      [name] local son: [name] (and wife [name])- [phone]   [name] son in [place]: [name]- [phone]   Social Determinants of Health   Financial Resource Strain: Low Risk    Difficulty of Paying Living Expenses: Not hard at all  Food Insecurity: No Food Insecurity   Worried About Charity fundraiser in the Last Year: Never true   Arboriculturist in the Last Year: Never true  Transportation Needs: No Transportation Needs   Lack of Transportation (Medical): No   Lack of Transportation (Non-Medical): No  Physical Activity: Insufficiently Active   Days of Exercise per Week: 2 days   Minutes of Exercise per Session: 20 min  Stress: No Stress Concern Present   Feeling of Stress : Not at all  Social Connections: Unknown   Frequency of Communication with Friends and Family: Not on file   Frequency of Social Gatherings with Friends and Family: Not on file   Attends Religious Services: Not on Electrical engineer or Organizations: Not on file   Attends Archivist Meetings: Not on file   Marital Status: Married  Human resources officer Violence: Not At Risk   Fear of Current or Ex-Partner: No   Emotionally Abused: No   Physically Abused: No    Sexually Abused: No    Past Medical History, Surgical history, Social history, and Family history were reviewed and updated as appropriate.   Please see review of systems for further details on the patient's review from today.   Objective:   Physical Exam:  There were no vitals taken for this visit.  Physical Exam Constitutional:      General: He is not in acute distress.    Appearance: He is well-developed.  Musculoskeletal:        General: No deformity.  Neurological:     Mental Status: He is alert and oriented to person, place, and time.     Motor: Tremor present.     Coordination: Coordination normal.     Gait: Gait normal.  Psychiatric:        Attention and Perception: He is attentive. He does not perceive auditory hallucinations.        Mood and Affect: Mood is not anxious or depressed. Affect is not labile, blunt, angry or inappropriate.        Speech: Speech normal. Speech is not slurred.        Behavior: Behavior normal.        Thought Content: Thought content normal. Thought content is not delusional. Thought content does not include homicidal or suicidal ideation. Thought content does not include suicidal plan.        Cognition and Memory: Cognition and memory normal.     Comments: Insight and judgment fair.  Does not really understand bipolar.. no mania. No auditory or visual hallucinations. No delusions.     Lab Review:     Component Value Date/Time   NA 141 04/30/2021 1029   NA 144 03/25/2016 0901   NA 143 12/03/2014 1036   K 3.7 04/30/2021 1029   K 3.4 (L) 12/03/2014 1036   CL 106 04/30/2021 1029   CL 111 (H) 12/03/2014 1036   CO2 26 04/30/2021 1029   CO2 24 12/03/2014 1036   GLUCOSE 105 (H) 04/30/2021 1029   GLUCOSE 103 (H) 12/03/2014 1036   BUN 23 04/30/2021 1029   BUN 20 03/25/2016 0901  BUN 16 12/03/2014 1036   CREATININE 1.02 04/30/2021 1029   CREATININE 1.13 12/03/2014 1036   CALCIUM 8.8 04/30/2021 1029   CALCIUM 8.3 (L) 12/03/2014 1036    PROT 6.3 04/30/2021 1029   PROT 6.3 03/25/2016 0901   PROT 6.8 12/03/2014 1036   ALBUMIN 4.0 04/30/2021 1029   ALBUMIN 4.2 03/25/2016 0901   ALBUMIN 3.7 12/03/2014 1036   AST 13 04/30/2021 1029   AST 23 12/03/2014 1036   ALT 9 04/30/2021 1029   ALT 22 12/03/2014 1036   ALKPHOS 93 04/30/2021 1029   ALKPHOS 94 12/03/2014 1036   BILITOT 0.3 04/30/2021 1029   BILITOT 0.3 03/25/2016 0901   BILITOT 0.8 12/03/2014 1036   GFRNONAA 71 03/25/2016 0901   GFRNONAA >60 12/03/2014 1036   GFRAA 82 03/25/2016 0901   GFRAA >60 12/03/2014 1036       Component Value Date/Time   WBC 3.1 (L) 04/30/2021 1029   RBC 4.12 (L) 04/30/2021 1029   HGB 12.4 (L) 04/30/2021 1029   HGB 12.4 (L) 03/25/2016 0901   HCT 37.4 (L) 04/30/2021 1029   HCT 37.2 (L) 03/25/2016 0901   PLT 214.0 04/30/2021 1029   PLT 278 03/25/2016 0901   MCV 90.7 04/30/2021 1029   MCV 91 03/25/2016 0901   MCV 96 12/03/2014 1036   MCH 30.2 11/15/2019 0926   MCHC 33.3 04/30/2021 1029   RDW 15.3 04/30/2021 1029   RDW 13.5 03/25/2016 0901   RDW 13.5 12/03/2014 1036   LYMPHSABS 0.6 (L) 04/30/2021 1029   LYMPHSABS 0.9 03/25/2016 0901   MONOABS 0.4 04/30/2021 1029   EOSABS 0.1 04/30/2021 1029   EOSABS 0.1 03/25/2016 0901   BASOSABS 0.0 04/30/2021 1029   BASOSABS 0.0 03/25/2016 0901    No results found for: POCLITH, LITHIUM   No results found for: PHENYTOIN, PHENOBARB, VALPROATE, CBMZ   .res Assessment: Plan:    Bipolar I disorder ([place]) - Plan: PARoxetine (PAXIL) 40 MG tablet, QUEtiapine (SEROQUEL) 400 MG tablet  Insomnia due to mental condition  Benign essential tremor - Plan: primidone (MYSOLINE) 50 MG tablet   Bipolar managed per pt and his wife.  Worse with paroxetine reduction and better with 40 mg daily.  No problems since here with mood and anxietyf.  Disc risk SSRI causing cycling but he's having none.  Tolerating meds.  No change indicated.  Failed reducition in meds.  Disc SE in detail and SSRI withdrawal  sx.  Discussed potential metabolic side effects associated with atypical antipsychotics, as well as potential risk for movement side effects. Advised pt to contact office if movement side effects occur.  Continue quetiapine 800 mg HS and paroxetine 40 mg daily.  Discussed potential metabolic side effects associated with atypical antipsychotics, as well as potential risk for movement side effects. Advised pt to contact office if movement side effects occur.   Fall precautions.   FU 12 mos bc is stable for awhile.  Lynder Parents, MD, DFAPA   Please see After Visit Summary for patient specific instructions.  Future Appointments  Date Time Provider [place]  12/14/2021  1:30 PM Ralene Bathe, MD [facility] None  05/02/2022  1:15 PM Leone Haven, MD [facility]    No orders of the defined types were placed in this encounter.     -------------------------------

## 2021-12-09 DIAGNOSIS — H6123 Impacted cerumen, bilateral: Secondary | ICD-10-CM | POA: Diagnosis not present

## 2021-12-09 DIAGNOSIS — H93293 Other abnormal auditory perceptions, bilateral: Secondary | ICD-10-CM | POA: Diagnosis not present

## 2021-12-14 ENCOUNTER — Other Ambulatory Visit: Payer: Self-pay

## 2021-12-14 ENCOUNTER — Ambulatory Visit: Payer: PPO | Admitting: Dermatology

## 2021-12-14 ENCOUNTER — Encounter: Payer: Self-pay | Admitting: Dermatology

## 2021-12-14 DIAGNOSIS — L814 Other melanin hyperpigmentation: Secondary | ICD-10-CM | POA: Diagnosis not present

## 2021-12-14 DIAGNOSIS — Z85820 Personal history of malignant melanoma of skin: Secondary | ICD-10-CM | POA: Diagnosis not present

## 2021-12-14 DIAGNOSIS — D229 Melanocytic nevi, unspecified: Secondary | ICD-10-CM | POA: Diagnosis not present

## 2021-12-14 DIAGNOSIS — L578 Other skin changes due to chronic exposure to nonionizing radiation: Secondary | ICD-10-CM | POA: Diagnosis not present

## 2021-12-14 DIAGNOSIS — R591 Generalized enlarged lymph nodes: Secondary | ICD-10-CM

## 2021-12-14 DIAGNOSIS — D485 Neoplasm of uncertain behavior of skin: Secondary | ICD-10-CM

## 2021-12-14 DIAGNOSIS — Z1283 Encounter for screening for malignant neoplasm of skin: Secondary | ICD-10-CM

## 2021-12-14 DIAGNOSIS — L821 Other seborrheic keratosis: Secondary | ICD-10-CM | POA: Diagnosis not present

## 2021-12-14 DIAGNOSIS — D225 Melanocytic nevi of trunk: Secondary | ICD-10-CM

## 2021-12-14 DIAGNOSIS — D1800 Hemangioma unspecified site: Secondary | ICD-10-CM | POA: Diagnosis not present

## 2021-12-14 NOTE — Patient Instructions (Addendum)

## 2021-12-14 NOTE — Progress Notes (Signed)
Follow-Up Visit   Subjective  Rodney Branch is a 88 y.o. male who presents for the following: Annual Exam (Hx MM - excised by Dr. Freddi Che in Dec 2022, wife noticed an inflamed area near excision site and would like that checked today.). The patient presents for Total-Body Skin Exam (TBSE) for skin cancer screening and mole check.  The patient has spots, moles and lesions to be evaluated, some may be new or changing.  The following portions of the chart were reviewed this encounter and updated as appropriate:   Tobacco   Allergies   Meds   Problems   Med Hx   Surg Hx   Fam Hx      Review of Systems:  No other skin or systemic complaints except as noted in HPI or Assessment and Plan.  Objective  Well appearing patient in no apparent distress; mood and affect are within normal limits.  A full examination was performed including scalp, head, eyes, ears, nose, lips, neck, chest, axillae, abdomen, back, buttocks, bilateral upper extremities, bilateral lower extremities, hands, feet, fingers, toes, fingernails, and toenails. All findings within normal limits unless otherwise noted below.  Right Axilla Peanut sized soft swelling.   Supra umbilical Irregular brown macule 1.1 cm        Assessment & Plan  Lymphadenopathy Right Axilla Soft and nontender peanut size. May be associated with inflammation from recent melanoma excision or from patient's arthritis. Not concerning today.  If enlarges or does not resolve, will consider biopsy as may be associated with patient's recent melanoma.[name]  Neoplasm of uncertain behavior of skin Supra umbilical Epidermal / dermal shaving  Lesion diameter (cm):  1.1 Informed consent: discussed and consent obtained   Timeout: patient name, date of birth, surgical site, and procedure verified   Procedure prep:  Patient was prepped and draped in usual sterile fashion Prep type:  Isopropyl alcohol Anesthesia: the lesion was anesthetized in a standard fashion    Anesthetic:  1% lidocaine w/ epinephrine 1-100,000 buffered w/ 8.4% NaHCO3 Instrument used: flexible razor blade   Hemostasis achieved with: pressure, aluminum chloride and electrodesiccation   Outcome: patient tolerated procedure well   Post-procedure details: sterile dressing applied and wound care instructions given   Dressing type: bandage and petrolatum    Specimen 1 - Surgical pathology Differential Diagnosis: D48.5 r/o dysplastic nevus  Check Margins: No  Lentigines - Scattered tan macules - Due to sun exposure - Benign-appearing, observe - Recommend daily broad spectrum sunscreen SPF 30+ to sun-exposed areas, reapply every 2 hours as needed. - Call for any changes  Seborrheic Keratoses - Stuck-on, waxy, tan-brown papules and/or plaques  - Benign-appearing - Discussed benign etiology and prognosis. - Observe - Call for any changes  Melanocytic Nevi - Tan-brown and/or pink-flesh-colored symmetric macules and papules - Benign appearing on exam today - Observation - Call clinic for new or changing moles - Recommend daily use of broad spectrum spf 30+ sunscreen to sun-exposed areas.   Hemangiomas - Red papules - Discussed benign nature - Observe - Call for any changes  Actinic Damage - Chronic condition, secondary to cumulative UV/sun exposure - diffuse scaly erythematous macules with underlying dyspigmentation - Recommend daily broad spectrum sunscreen SPF 30+ to sun-exposed areas, reapply every 2 hours as needed.  - Staying in the shade or wearing long sleeves, sun glasses (UVA+UVB protection) and wide brim hats (4-inch brim around the entire circumference of the hat) are also recommended for sun protection.  - Call for new  or changing lesions.  History of Melanoma - R upper back -status post [facility] - No evidence of recurrence today - No lymphadenopathy - Recommend regular full body skin exams - Recommend  daily broad spectrum sunscreen SPF 30+ to sun-exposed areas, reapply every 2 hours as needed.  - Call if any new or changing lesions are noted between office visits  Skin cancer screening performed today.  Return in about 3 months (around 03/14/2022) for TBSE.  [name], CMA, am acting as scribe for Sarina Ser, MD . Documentation: I have reviewed the above documentation for accuracy and completeness, and I agree with the above.  Sarina Ser, MD

## 2021-12-16 ENCOUNTER — Telehealth: Payer: Self-pay

## 2021-12-16 NOTE — Telephone Encounter (Signed)
-----   Message from Ralene Bathe, MD sent at 12/15/2021  6:00 PM EST ----- Diagnosis Skin , supra umbilical DYSPLASTIC NEVUS WITH MODERATE TO SEVERE ATYPIA, CLOSE TO MARGIN, SEE DESCRIPTION  Moderate to severe dysplastic Margins clear, but "close to margin" Recheck next visit May need additional procedure

## 2021-12-16 NOTE — Telephone Encounter (Signed)
Spoke with pt and wife and informed them of results. They had no concerns.

## 2021-12-16 NOTE — Telephone Encounter (Signed)
Left message on voicemail to return my call.  

## 2022-01-19 ENCOUNTER — Telehealth: Payer: Self-pay | Admitting: Psychiatry

## 2022-01-19 NOTE — Telephone Encounter (Signed)
I addressed this with patient already.

## 2022-01-19 NOTE — Telephone Encounter (Signed)
Patient's spouse Thea Gist stating she picked up sleeping med for [place] and it was only a quantity of 89. She is inquiring if Dr Clovis Pu changed the dosing because she usually  pick 90. # [phone] please advise.

## 2022-03-14 ENCOUNTER — Other Ambulatory Visit: Payer: Self-pay | Admitting: Family Medicine

## 2022-03-14 DIAGNOSIS — N400 Enlarged prostate without lower urinary tract symptoms: Secondary | ICD-10-CM

## 2022-03-22 ENCOUNTER — Ambulatory Visit: Payer: PPO | Admitting: Dermatology

## 2022-03-22 DIAGNOSIS — D229 Melanocytic nevi, unspecified: Secondary | ICD-10-CM | POA: Diagnosis not present

## 2022-03-22 DIAGNOSIS — D1800 Hemangioma unspecified site: Secondary | ICD-10-CM

## 2022-03-22 DIAGNOSIS — L578 Other skin changes due to chronic exposure to nonionizing radiation: Secondary | ICD-10-CM | POA: Diagnosis not present

## 2022-03-22 DIAGNOSIS — Z1283 Encounter for screening for malignant neoplasm of skin: Secondary | ICD-10-CM | POA: Diagnosis not present

## 2022-03-22 DIAGNOSIS — Z85820 Personal history of malignant melanoma of skin: Secondary | ICD-10-CM | POA: Diagnosis not present

## 2022-03-22 DIAGNOSIS — Z86018 Personal history of other benign neoplasm: Secondary | ICD-10-CM

## 2022-03-22 DIAGNOSIS — L821 Other seborrheic keratosis: Secondary | ICD-10-CM

## 2022-03-22 DIAGNOSIS — L814 Other melanin hyperpigmentation: Secondary | ICD-10-CM | POA: Diagnosis not present

## 2022-03-22 NOTE — Progress Notes (Signed)
? ?Follow-Up Visit ?  ?Subjective  ?Rodney Branch is a 88 y.o. male who presents for the following: Annual Exam (3 months f/u hx of Melanoma on the right upper back, excised 11/09/2021 at [facility] ). Hx of Dysplastic nevus ?The patient presents for Total-Body Skin Exam (TBSE) for skin cancer screening and mole check.  The patient has spots, moles and lesions to be evaluated, some may be new or changing and the patient has concerns that these could be cancer.  ? ?The following portions of the chart were reviewed this encounter and updated as appropriate:  ? Tobacco  Allergies  Meds  Problems  Med Hx  Surg Hx  Fam Hx   ?  ?Review of Systems:  No other skin or systemic complaints except as noted in HPI or Assessment and Plan. ? ?Objective  ?Well appearing patient in no apparent distress; mood and affect are within normal limits. ? ?A full examination was performed including scalp, head, eyes, ears, nose, lips, neck, chest, axillae, abdomen, back, buttocks, bilateral upper extremities, bilateral lower extremities, hands, feet, fingers, toes, fingernails, and toenails. All findings within normal limits unless otherwise noted below. ? ?Right Upper Back, Right abdomen ?Well healed scar with no evidence of recurrence, no lymphadenopathy.  ? ? ?Assessment & Plan  ?History of melanoma ?Right Upper Back, Right abdomen ? ?History of Melanoma ?Right upper back 2022 ?Right abdomen 2015 ?- No evidence of recurrence today ?- No lymphadenopathy ?- Recommend regular full body skin exams ?- Recommend daily broad spectrum sunscreen SPF 30+ to sun-exposed areas, reapply every 2 hours as needed.  ?- Call if any new or changing lesions are noted between office visits  ? ?Skin cancer screening ? ?Lentigines ?- Scattered tan macules ?- Due to sun exposure ?- Benign-appearing, observe ?- Recommend daily broad spectrum sunscreen SPF 30+ to sun-exposed areas, reapply every 2 hours as needed. ?- Call for any changes ? ?Seborrheic  Keratoses ?- Stuck-on, waxy, tan-brown papules and/or plaques  ?- Benign-appearing ?- Discussed benign etiology and prognosis. ?- Observe ?- Call for any changes ? ?Melanocytic Nevi ?- Tan-brown and/or pink-flesh-colored symmetric macules and papules ?- Benign appearing on exam today ?- Observation ?- Call clinic for new or changing moles ?- Recommend daily use of broad spectrum spf 30+ sunscreen to sun-exposed areas.  ? ?Hemangiomas ?- Red papules ?- Discussed benign nature ?- Observe ?- Call for any changes ? ?Actinic Damage ?- Chronic condition, secondary to cumulative UV/sun exposure ?- diffuse scaly erythematous macules with underlying dyspigmentation ?- Recommend daily broad spectrum sunscreen SPF 30+ to sun-exposed areas, reapply every 2 hours as needed.  ?- Staying in the shade or wearing long sleeves, sun glasses (UVA+UVB protection) and wide brim hats (4-inch brim around the entire circumference of the hat) are also recommended for sun protection.  ?- Call for new or changing lesions. ? ?History of Dysplastic Nevi ?Multiple see history  ?- No evidence of recurrence today ?- Recommend regular full body skin exams ?- Recommend daily broad spectrum sunscreen SPF 30+ to sun-exposed areas, reapply every 2 hours as needed.  ?- Call if any new or changing lesions are noted between office visits  ? ?Skin cancer screening performed today.  ? ?Return in about 3 months (around 06/21/2022) for TBSE, hx of Melanoma . ? ?I, Marye Round, CMA, am acting as scribe for Sarina Ser, MD .  ?Documentation: I have reviewed the above documentation for accuracy and completeness, and I agree with the above. ? ?Sarina Ser,  MD ? ?

## 2022-03-22 NOTE — Patient Instructions (Signed)

## 2022-03-29 ENCOUNTER — Encounter: Payer: Self-pay | Admitting: Dermatology

## 2022-05-02 ENCOUNTER — Encounter: Payer: PPO | Admitting: Family Medicine

## 2022-06-08 ENCOUNTER — Encounter: Payer: Self-pay | Admitting: Family Medicine

## 2022-06-08 ENCOUNTER — Ambulatory Visit (INDEPENDENT_AMBULATORY_CARE_PROVIDER_SITE_OTHER): Payer: PPO | Admitting: Family Medicine

## 2022-06-08 VITALS — BP 110/80 | HR 90 | Temp 98.7°F | Ht 67.0 in | Wt 133.6 lb

## 2022-06-08 DIAGNOSIS — Z0000 Encounter for general adult medical examination without abnormal findings: Secondary | ICD-10-CM | POA: Diagnosis not present

## 2022-06-08 DIAGNOSIS — D709 Neutropenia, unspecified: Secondary | ICD-10-CM

## 2022-06-08 LAB — CBC WITH DIFFERENTIAL/PLATELET
Basophils Absolute: 0 10*3/uL (ref 0.0–0.1)
Basophils Relative: 0.8 % (ref 0.0–3.0)
Eosinophils Absolute: 0.1 10*3/uL (ref 0.0–0.7)
Eosinophils Relative: 1.7 % (ref 0.0–5.0)
HCT: 37.5 % — ABNORMAL LOW (ref 39.0–52.0)
Hemoglobin: 12.2 g/dL — ABNORMAL LOW (ref 13.0–17.0)
Lymphocytes Relative: 26.2 % (ref 12.0–46.0)
Lymphs Abs: 0.9 10*3/uL (ref 0.7–4.0)
MCHC: 32.7 g/dL (ref 30.0–36.0)
MCV: 92.3 fl (ref 78.0–100.0)
Monocytes Absolute: 0.4 10*3/uL (ref 0.1–1.0)
Monocytes Relative: 11.6 % (ref 3.0–12.0)
Neutro Abs: 2 10*3/uL (ref 1.4–7.7)
Neutrophils Relative %: 59.7 % (ref 43.0–77.0)
Platelets: 245 10*3/uL (ref 150.0–400.0)
RBC: 4.06 Mil/uL — ABNORMAL LOW (ref 4.22–5.81)
RDW: 14.1 % (ref 11.5–15.5)
WBC: 3.4 10*3/uL — ABNORMAL LOW (ref 4.0–10.5)

## 2022-06-08 LAB — COMPREHENSIVE METABOLIC PANEL
ALT: 9 U/L (ref 0–53)
AST: 16 U/L (ref 0–37)
Albumin: 4.1 g/dL (ref 3.5–5.2)
Alkaline Phosphatase: 114 U/L (ref 39–117)
BUN: 20 mg/dL (ref 6–23)
CO2: 27 mEq/L (ref 19–32)
Calcium: 9 mg/dL (ref 8.4–10.5)
Chloride: 104 mEq/L (ref 96–112)
Creatinine, Ser: 1.05 mg/dL (ref 0.40–1.50)
GFR: 63.1 mL/min (ref >60.00)
Glucose, Bld: 78 mg/dL (ref 70–99)
Potassium: 4.1 mEq/L (ref 3.5–5.1)
Sodium: 137 mEq/L (ref 135–145)
Total Bilirubin: 0.5 mg/dL (ref 0.2–1.2)
Total Protein: 6.8 g/dL (ref 6.0–8.3)

## 2022-06-08 NOTE — Assessment & Plan Note (Signed)
Physical exam completed.  I encouraged him to get a few more fruits and vegetables.  I did encourage him to walk more for exercise.  I advised that the updated COVID booster was available and he can get this at the pharmacy if he desired.  Discussed Shingrix vaccine and advised he can get that at the pharmacy if he desires.  I encouraged him to see the eye doctor once yearly.  Age-appropriate lab work as outlined below.

## 2022-06-08 NOTE — Progress Notes (Signed)
Rodney Rumps, MD Phone: [phone]  Rodney Branch is a 88 y.o. male who presents today for CPE.  Diet: Likes to eat [place] and goulash.  Has chicken once a week.  Eats squash and corn though not much other vegetables.  Does have 1.5 cookies daily.  Drinks orange soda. Exercise: Does not exercise much, does walk some Colonoscopy: aged out Prostate cancer screening: aged out Family history-  Prostate cancer: no  Colon cancer: no Vaccines-   Flu: UTD  Tetanus: UTD  Shingles: due  COVID19: x3, no bivalent booster  Pneumonia: UTD Tobacco use: no Alcohol use: no Illicit Drug use: no Dentist: 1x/year Ophthalmology: due for   Active Ambulatory Problems    Diagnosis Date Noted   Mood disorder ([place]) 12/24/2007   Benign familial tremor 01/01/2008   GERD 05/20/2009   BPH without obstruction/lower urinary tract symptoms 12/24/2007   Knee osteoarthritis 11/11/2014   Constipation 05/14/2015   History of melanoma 03/24/2016   Mixed hyperlipidemia 11/15/2016   Normocytic anemia 11/15/2016   Tremor 10/19/2018   Neutropenia ([place]) 04/24/2020   Chronic neck pain 10/16/2020   Routine general medical examination at a health care facility 04/30/2021   Melanoma of skin ([place]) 10/27/2021   Resolved Ambulatory Problems    Diagnosis Date Noted   Anxiety state 12/11/2007   INSOMNIA, CHRONIC 05/20/2009   HYPERTENSION 12/24/2007   DUPUYTREN'S CONTRACTURE 07/12/2010   SYNCOPE 05/20/2009   Other abnormal glucose 12/21/2007   ALLERGY 12/29/2008   Medicare annual wellness visit, subsequent 04/04/2011   Visual hallucinations 09/22/2011   Dementia ([place]) 09/22/2011   Sinusitis, acute 09/24/2012   Arthritis of knee 11/11/2014   Memory deficit 12/02/2014   Rectal bleeding 04/24/2015   Fecal impaction ([place]) 04/24/2015   Benign fibroma of prostate 03/24/2016   Clinical depression 12/11/2014   Essential hypertension 03/24/2016   Decreased potassium in the blood 12/16/2014   Encounter to  establish care 03/24/2016   Chronic rhinitis 11/15/2016   Past Medical History:  Diagnosis Date   Actinic keratosis 01/21/2016   Arthritis    Benign prostatic hypertrophy 1996   Depression 1996   Dysplastic nevus 09/16/2015   Dysplastic nevus 09/16/2015   Dysplastic nevus 02/15/2018   Dysplastic nevus 02/15/2018   Dysplastic nevus 02/15/2018   Dysplastic nevus 02/15/2018   Dysplastic nevus 09/12/2018   Dysplastic nevus 12/14/2021   GERD (gastroesophageal reflux disease)    Malignant melanoma ([place]) 2015   Malignant melanoma ([place]) 09/01/2021   Psychiatric hospitalization 12/1994, 11/2014   Tremor, essential     Family History  Problem Relation Age of Onset   Depression Mother 64       alzheimers//long history   Alzheimer's disease Mother    Cancer Father 27       cancer of stomach   Alcohol abuse Neg Hx    Diabetes Neg Hx    Stroke Neg Hx    CAD Neg Hx     Social History   Socioeconomic History   Marital status: Married    Spouse name: Not on file   Number of children: 3   Years of education: Not on file   Highest education level: Not on file  Occupational History   Occupation: Research scientist (life sciences)    Comment: Retired   Occupation: [employer] course    Comment: retired also  Tobacco Use   Smoking status: Never   Smokeless tobacco: Never  Vaping Use   Vaping Use: Never used  Substance and Sexual Activity  Alcohol use: Not Currently    Alcohol/week: 0.0 standard drinks of alcohol   Drug use: No   Sexual activity: Not on file  Other Topics Concern   Not on file  Social History Narrative   Has living will   Lives with wife in [place]   Wife, then son [name], is his health care POA   Would accept resuscitation attempts but no prolonged ventilation   Would consider feeding tube   Occupation: retired, was Research scientist (life sciences)   Activity: bowling 3x/wk   Diet: good water, fruits/vegetables daily      [name] local son: [name] (and wife [name])- [phone]   [name]  son in [place]: [name]- [phone]   Social Determinants of Health   Financial Resource Strain: [place]  (07/06/2021)   Overall Financial Resource Strain (CARDIA)    Difficulty of Paying Living Expenses: Not hard at all  Food Insecurity: No [place] (07/06/2021)   Hunger Vital Sign    Worried About Running Out of Food in the Last Year: Never true    [place] in the Last Year: Never true  Transportation Needs: No Transportation Needs (07/06/2021)   PRAPARE - Hydrologist (Medical): No    Lack of Transportation (Non-Medical): No  Physical Activity: Insufficiently Active (07/06/2021)   Exercise Vital Sign    Days of Exercise per Week: 2 days    Minutes of Exercise per Session: 20 min  Stress: No Stress Concern Present (07/06/2021)   [place]    Feeling of Stress : Not at all  Social Connections: Unknown (07/06/2021)   Social Connection and Isolation Panel [NHANES]    Frequency of Communication with Friends and Family: Not on file    Frequency of Social Gatherings with Friends and Family: Not on file    Attends Religious Services: Not on file    Active Member of Clubs or Organizations: Not on file    Attends Archivist Meetings: Not on file    Marital Status: Married  Intimate Partner Violence: Not At Risk (07/06/2021)   Humiliation, Afraid, Rape, and Kick questionnaire    Fear of Current or Ex-Partner: No    Emotionally Abused: No    Physically Abused: No    Sexually Abused: No    ROS  General:  Negative for nexplained weight loss, fever Skin: Negative for new or changing mole, sore that won't heal HEENT: Positive for trouble hearing (chronic), ringing in ears (chronic), negative for trouble seeing, mouth sores, hoarseness, change in voice, dysphagia. CV:  Negative for chest pain, dyspnea, edema, palpitations Resp: Negative for cough, dyspnea, hemoptysis GI: Negative for  nausea, vomiting, diarrhea, constipation, abdominal pain, melena, hematochezia. GU: Negative for dysuria, incontinence, urinary hesitance, hematuria, vaginal or penile discharge, polyuria, sexual difficulty, lumps in testicle or breasts MSK: Negative for muscle cramps or aches, joint pain or swelling Neuro: Negative for headaches, weakness, numbness, dizziness, passing out/fainting Psych: Negative for depression, anxiety, memory problems  Objective  Physical Exam Vitals:   06/08/22 1337  BP: 110/80  Pulse: 90  Temp: 98.7 F (37.1 C)  SpO2: 99%    BP Readings from Last 3 Encounters:  06/08/22 110/80  07/19/21 130/80  04/30/21 110/70   Wt Readings from Last 3 Encounters:  06/08/22 133 lb 9.6 oz (60.6 kg)  10/27/21 139 lb (63 kg)  07/19/21 139 lb 9.6 oz (63.3 kg)    Physical Exam Constitutional:  General: He is not in acute distress.    Appearance: He is not diaphoretic.  HENT:     Head: Normocephalic and atraumatic.  Eyes:     Conjunctiva/sclera: Conjunctivae normal.     Pupils: Pupils are equal, round, and reactive to light.  Cardiovascular:     Rate and Rhythm: Normal rate and regular rhythm.     Heart sounds: Normal heart sounds.  Pulmonary:     Effort: Pulmonary effort is normal.     Breath sounds: Normal breath sounds.  Abdominal:     General: Bowel sounds are normal. There is no distension.     Palpations: Abdomen is soft.     Tenderness: There is no abdominal tenderness.  Musculoskeletal:     Right lower leg: No edema.     Left lower leg: No edema.  Lymphadenopathy:     Cervical: No cervical adenopathy.  Skin:    General: Skin is warm and dry.  Neurological:     Mental Status: He is alert.      Assessment/Plan:   Problem List Items Addressed This Visit     Neutropenia ([place])   Relevant Orders   Comp Met (CMET)   CBC w/Diff   Routine general medical examination at a health care facility - Primary    Physical exam completed.  I encouraged  him to get a few more fruits and vegetables.  I did encourage him to walk more for exercise.  I advised that the updated COVID booster was available and he can get this at the pharmacy if he desired.  Discussed Shingrix vaccine and advised he can get that at the pharmacy if he desires.  I encouraged him to see the eye doctor once yearly.  Age-appropriate lab work as outlined below.       Return in about 1 year (around 06/09/2023) for CPE.   Rodney Rumps, MD [place]

## 2022-06-13 ENCOUNTER — Other Ambulatory Visit: Payer: Self-pay | Admitting: Family Medicine

## 2022-06-13 DIAGNOSIS — N400 Enlarged prostate without lower urinary tract symptoms: Secondary | ICD-10-CM

## 2022-06-23 ENCOUNTER — Ambulatory Visit: Payer: PPO | Admitting: Dermatology

## 2022-06-23 DIAGNOSIS — D1800 Hemangioma unspecified site: Secondary | ICD-10-CM | POA: Diagnosis not present

## 2022-06-23 DIAGNOSIS — L821 Other seborrheic keratosis: Secondary | ICD-10-CM | POA: Diagnosis not present

## 2022-06-23 DIAGNOSIS — Z1283 Encounter for screening for malignant neoplasm of skin: Secondary | ICD-10-CM | POA: Diagnosis not present

## 2022-06-23 DIAGNOSIS — L814 Other melanin hyperpigmentation: Secondary | ICD-10-CM | POA: Diagnosis not present

## 2022-06-23 DIAGNOSIS — L570 Actinic keratosis: Secondary | ICD-10-CM

## 2022-06-23 DIAGNOSIS — L578 Other skin changes due to chronic exposure to nonionizing radiation: Secondary | ICD-10-CM | POA: Diagnosis not present

## 2022-06-23 DIAGNOSIS — Z86018 Personal history of other benign neoplasm: Secondary | ICD-10-CM | POA: Diagnosis not present

## 2022-06-23 DIAGNOSIS — D229 Melanocytic nevi, unspecified: Secondary | ICD-10-CM | POA: Diagnosis not present

## 2022-06-23 DIAGNOSIS — Z85820 Personal history of malignant melanoma of skin: Secondary | ICD-10-CM

## 2022-06-23 DIAGNOSIS — L219 Seborrheic dermatitis, unspecified: Secondary | ICD-10-CM | POA: Diagnosis not present

## 2022-06-23 DIAGNOSIS — L820 Inflamed seborrheic keratosis: Secondary | ICD-10-CM

## 2022-06-23 MED ORDER — HYDROCORTISONE 2.5 % EX CREA: TOPICAL_CREAM | CUTANEOUS | 4 refills | Status: DC

## 2022-06-23 NOTE — Patient Instructions (Addendum)
Cryotherapy Aftercare  Wash gently with soap and water everyday.   Apply Vaseline and Band-Aid daily until healed.    Start Hydrocortisone 2.5% cream 3 days a week, Monday, Wednesday and Fridays to scaly rash on chest and behind ears     Due to recent changes in healthcare laws, you may see results of your pathology and/or laboratory studies on MyChart before the doctors have had a chance to review them. We understand that in some cases there may be results that are confusing or concerning to you. Please understand that not all results are received at the same time and often the doctors may need to interpret multiple results in order to provide you with the best plan of care or course of treatment. Therefore, we ask that you please give [place] 2 business days to thoroughly review all your results before contacting the office for clarification. Should we see a critical lab result, you will be contacted sooner.   If You Need Anything After Your Visit  If you have any questions or concerns for your doctor, please call our main line at [phone] and press option 4 to reach your doctor's medical assistant. If no one answers, please leave a voicemail as directed and we will return your call as soon as possible. Messages left after 4 pm will be answered the following business day.   You may also send [place] a message via [place]. We typically respond to MyChart messages within 1-2 business days.  For prescription refills, please ask your pharmacy to contact our office. Our fax number is [phone].  If you have an urgent issue when the clinic is closed that cannot wait until the next business day, you can page your doctor at the number below.    Please note that while we do our best to be available for urgent issues outside of office hours, we are not available 24/7.   If you have an urgent issue and are unable to reach [place], you may choose to seek medical care at your doctor's office, retail clinic,  urgent care center, or emergency room.  If you have a medical emergency, please immediately call 911 or go to the emergency department.  Pager Numbers  - Dr. Nehemiah Massed: [phone]  - Dr. Laurence Ferrari: [phone]  - Dr. Nicole Kindred: [phone]  In the event of inclement weather, please call our main line at [phone] for an update on the status of any delays or closures.  Dermatology Medication Tips: Please keep the boxes that topical medications come in in order to help keep track of the instructions about where and how to use these. Pharmacies typically print the medication instructions only on the boxes and not directly on the medication tubes.   If your medication is too expensive, please contact our office at [phone] option 4 or send [place] a message through [place].   We are unable to tell what your co-pay for medications will be in advance as this is different depending on your insurance coverage. However, we may be able to find a substitute medication at lower cost or fill out paperwork to get insurance to cover a needed medication.   If a prior authorization is required to get your medication covered by your insurance company, please allow [place] 1-2 business days to complete this process.  Drug prices often vary depending on where the prescription is filled and some pharmacies may offer cheaper prices.  The website www.goodrx.com contains coupons for medications through different pharmacies. The prices here do  not account for what the cost may be with help from insurance (it may be cheaper with your insurance), but the website can give you the price if you did not use any insurance.  - You can print the associated coupon and take it with your prescription to the pharmacy.  - You may also stop by our office during regular business hours and pick up a GoodRx coupon card.  - If you need your prescription sent electronically to a different pharmacy, notify our office through [facility] or by phone at [phone] option 4.     Si Usted Necesita Algo Despus de Su Visita  Tambin puede enviarnos un mensaje a travs de Pharmacist, community. Por lo general respondemos a los mensajes de MyChart en el transcurso de 1 a 2 das hbiles.  Para renovar recetas, por favor pida a su farmacia que se ponga en contacto con nuestra oficina. Harland Dingwall de fax es [place] [phone].  Si tiene un asunto urgente cuando la clnica est cerrada y que no puede esperar hasta el siguiente da hbil, puede llamar/localizar a su doctor(a) al nmero que aparece a continuacin.   Por favor, tenga en cuenta que aunque hacemos todo lo posible para estar disponibles para asuntos urgentes fuera del horario de [place], no estamos disponibles las 24 horas del da, los 7 das de la [place].   Si tiene un problema urgente y no puede comunicarse con nosotros, puede optar por buscar atencin mdica  en el consultorio de su doctor(a), en una clnica privada, en un centro de atencin urgente o en una sala de emergencias.  Si tiene Engineering geologist, por favor llame inmediatamente al 911 o vaya a la sala de emergencias.  Nmeros de bper  - Dr. Nehemiah Massed: [phone]  - Dra. Moye: [phone]  - Dra. Nicole Kindred: [phone]  En caso de inclemencias del [place], por favor llame a Johnsie Kindred principal al [phone] para una actualizacin sobre el [place] de cualquier retraso o cierre.  Consejos para la medicacin en dermatologa: Por favor, guarde las cajas en las que vienen los medicamentos de uso tpico para ayudarle a seguir las instrucciones sobre dnde y cmo usarlos. Las farmacias generalmente imprimen las instrucciones del medicamento slo en las cajas y no directamente en los tubos del [place].   Si su medicamento es muy caro, por favor, pngase en contacto con Zigmund Daniel llamando al [phone] y presione la opcin 4 o envenos un mensaje a travs de Pharmacist, community.   No podemos decirle cul  ser su copago por los medicamentos por adelantado ya que esto es diferente dependiendo de la cobertura de su seguro. Sin embargo, es posible que podamos encontrar un medicamento sustituto a Electrical engineer un formulario para que el seguro cubra el medicamento que se considera necesario.   Si se requiere una autorizacin previa para que su compaa de seguros [place] su medicamento, por favor permtanos de 1 a 2 das hbiles para completar este proceso.  Los precios de los medicamentos varan con frecuencia dependiendo del Environmental consultant de dnde se surte la receta y alguna farmacias pueden ofrecer precios ms baratos.  El sitio web www.goodrx.com tiene cupones para medicamentos de Airline pilot. Los precios aqu no tienen en cuenta lo que podra costar con la ayuda del seguro (puede ser ms barato con su seguro), pero el sitio web puede darle el precio si no utiliz Research scientist (physical sciences).  - Puede imprimir el cupn correspondiente y llevarlo con su receta a la farmacia.  -  Tambin puede pasar por nuestra oficina durante el horario de atencin regular y Charity fundraiser una tarjeta de cupones de GoodRx.  - Si necesita que su receta se enve electrnicamente a una farmacia diferente, informe a nuestra oficina a travs de MyChart de [place] o por telfono llamando al [phone] y presione la opcin 4.

## 2022-06-23 NOTE — Progress Notes (Signed)
Follow-Up Visit   Subjective  Rodney Branch is a 88 y.o. male who presents for the following: Total body skin exam (Hx of Melanoma R upper back 09/01/2021, R abdomen 2015, hx of Dysplastic nevi, hx of AKs).  Patient accompanied by wife who contributes to history.  The patient presents for Total-Body Skin Exam (TBSE) for skin cancer screening and mole check.  The patient has spots, moles and lesions to be evaluated, some may be new or changing and the patient has concerns that these could be cancer.   The following portions of the chart were reviewed this encounter and updated as appropriate:   Tobacco  Allergies  Meds  Problems  Med Hx  Surg Hx  Fam Hx     Review of Systems:  No other skin or systemic complaints except as noted in HPI or Assessment and Plan.  Objective  Well appearing patient in no apparent distress; mood and affect are within normal limits.  A full examination was performed including scalp, head, eyes, ears, nose, lips, neck, chest, axillae, abdomen, back, buttocks, bilateral upper extremities, bilateral lower extremities, hands, feet, fingers, toes, fingernails, and toenails. All findings within normal limits unless otherwise noted below.  Right Upper Back, R abdomen Well healed scars with no evidence of recurrence, no lymphadenopathy.   R forehead x 1, R ear x 1 (2) Pink scaly macules  R ear x 1 Stuck on waxy paps with erythema  chest and post auricular Pink patches with greasy scale chest, post auriculars   Assessment & Plan   Lentigines - Scattered tan macules - Due to sun exposure - Benign-appearing, observe - Recommend daily broad spectrum sunscreen SPF 30+ to sun-exposed areas, reapply every 2 hours as needed. - Call for any changes  Seborrheic Keratoses - Stuck-on, waxy, tan-brown papules and/or plaques  - Benign-appearing - Discussed benign etiology and prognosis. - Observe - Call for any changes  Melanocytic Nevi - Tan-brown  and/or pink-flesh-colored symmetric macules and papules - Benign appearing on exam today - Observation - Call clinic for new or changing moles - Recommend daily use of broad spectrum spf 30+ sunscreen to sun-exposed areas.   Hemangiomas - Red papules - Discussed benign nature - Observe - Call for any changes  Actinic Damage - Chronic condition, secondary to cumulative UV/sun exposure - diffuse scaly erythematous macules with underlying dyspigmentation - Recommend daily broad spectrum sunscreen SPF 30+ to sun-exposed areas, reapply every 2 hours as needed.  - Staying in the shade or wearing long sleeves, sun glasses (UVA+UVB protection) and wide brim hats (4-inch brim around the entire circumference of the hat) are also recommended for sun protection.  - Call for new or changing lesions.  Skin cancer screening performed today.  History of melanoma Right Upper Back, R abdomen R upper back 09/01/2021, R abdomen 2015 Clear. Observe for recurrence. Call clinic for new or changing lesions.  Recommend regular skin exams, daily broad-spectrum spf 30+ sunscreen use, and photoprotection.    AK (actinic keratosis) (2) R forehead x 1, R ear x 1 Destruction of lesion - R forehead x 1, R ear x 1 Complexity: simple   Destruction method: cryotherapy   Informed consent: discussed and consent obtained   Timeout:  patient name, date of birth, surgical site, and procedure verified Lesion destroyed using liquid nitrogen: Yes   Region frozen until ice ball extended beyond lesion: Yes   Outcome: patient tolerated procedure well with no complications   Post-procedure details: wound care  instructions given    Inflamed seborrheic keratosis R ear x 1 Symptomatic, irritating, patient would like treated. Destruction of lesion - R ear x 1 Complexity: simple   Destruction method: cryotherapy   Informed consent: discussed and consent obtained   Timeout:  patient name, date of birth, surgical site, and  procedure verified Lesion destroyed using liquid nitrogen: Yes   Region frozen until ice ball extended beyond lesion: Yes   Outcome: patient tolerated procedure well with no complications   Post-procedure details: wound care instructions given    Seborrheic dermatitis chest and post auricular Seborrheic Dermatitis  -  is a chronic persistent rash characterized by pinkness and scaling most commonly of the mid face but also can occur on the scalp (dandruff), ears; mid chest, mid back and groin.  It tends to be exacerbated by stress and cooler weather.  People who have neurologic disease may experience new onset or exacerbation of existing seborrheic dermatitis.  The condition is not curable but treatable and can be controlled.  Start HC 2.5% cr 3d/wk to chest and post auriculars  Topical steroids (such as triamcinolone, fluocinolone, fluocinonide, mometasone, clobetasol, halobetasol, betamethasone, hydrocortisone) can cause thinning and lightening of the skin if they are used for too long in the same area. Your physician has selected the right strength medicine for your problem and area affected on the body. Please use your medication only as directed by your physician to prevent side effects.    hydrocortisone 2.5 % cream - chest and post auricular Apply topically 3 (three) times a week. Apply to scaly rash on chest and behind ears 3 days a week, Monday, Wednesday and Fridays  History of Dysplastic Nevi - No evidence of recurrence today - Recommend regular full body skin exams - Recommend daily broad spectrum sunscreen SPF 30+ to sun-exposed areas, reapply every 2 hours as needed.  - Call if any new or changing lesions are noted between office visits  - multiple  Return in about 3 months (around 09/23/2022) for Hx of Melanoma, Hx of Dysplastic nevi, Hx of AKs.  I, [name], RMA, am acting as scribe for Rodney Ser, MD . Documentation: I have reviewed the above documentation for  accuracy and completeness, and I agree with the above.  Rodney Ser, MD

## 2022-06-26 ENCOUNTER — Encounter: Payer: Self-pay | Admitting: Dermatology

## 2022-07-06 ENCOUNTER — Telehealth: Payer: Self-pay | Admitting: Family Medicine

## 2022-07-06 NOTE — Telephone Encounter (Signed)
Copied from [place] [phone]. Topic: Medicare AWV >> Jul 06, 2022  9:29 AM Devoria Glassing wrote: Reason for CRM: Left message for patient to schedule Annual Wellness Visit.  Please schedule with Nurse Health Advisor Denisa O'Brien-Blaney, LPN at [facility]. This appt can be telephone or office visit.  Please call [phone] ask for [facility]

## 2022-07-07 ENCOUNTER — Telehealth: Payer: Self-pay

## 2022-07-07 ENCOUNTER — Ambulatory Visit (INDEPENDENT_AMBULATORY_CARE_PROVIDER_SITE_OTHER): Payer: PPO

## 2022-07-07 VITALS — Ht 67.0 in | Wt 133.0 lb

## 2022-07-07 DIAGNOSIS — Z0000 Encounter for general adult medical examination without abnormal findings: Secondary | ICD-10-CM
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
## 2022-07-07 NOTE — Telephone Encounter (Signed)
Unable to connect with patient for scheduled AWV. Patient phone line picks up but is muted. Nurse attempts to call the only phone line available x2.

## 2022-07-07 NOTE — Patient Instructions (Addendum)
  Rodney Branch , Thank you for taking time to come for your Medicare Wellness Visit. I appreciate your ongoing commitment to your health goals. Please review the following plan we discussed and let me know if I can assist you in the future.   These are the goals we discussed:  Goals       Patient Stated     Increase physical activity (pt-stated)      Continue to walk for exercise to the mailbox         This is a list of the screening recommended for you and due dates:  Health Maintenance  Topic Date Due   Flu Shot  06/28/2022   COVID-19 Vaccine (3 - Pfizer risk series) 07/23/2022*   Zoster (Shingles) Vaccine (1 of 2) 10/07/2022*   Tetanus Vaccine  08/29/2028   Pneumonia Vaccine  Completed   HPV Vaccine  Aged Out  *Topic was postponed. The date shown is not the original due date.

## 2022-07-07 NOTE — Telephone Encounter (Signed)
Patient and his wife, Fraser Din, returned Denisa's call.  I transferred call to [place].

## 2022-09-14 ENCOUNTER — Other Ambulatory Visit: Payer: Self-pay | Admitting: Family

## 2022-09-14 DIAGNOSIS — N400 Enlarged prostate without lower urinary tract symptoms: Secondary | ICD-10-CM

## 2022-09-19 ENCOUNTER — Telehealth: Payer: Self-pay

## 2022-09-19 ENCOUNTER — Other Ambulatory Visit: Payer: Self-pay

## 2022-09-19 DIAGNOSIS — N400 Enlarged prostate without lower urinary tract symptoms: Secondary | ICD-10-CM

## 2022-09-19 MED ORDER — DOXAZOSIN MESYLATE 8 MG PO TABS: 8.0000 mg | ORAL_TABLET | Freq: Every day | ORAL | 1 refills | Status: DC

## 2022-09-19 NOTE — Telephone Encounter (Signed)
Medication sent to [facility].  Tish Begin,cma

## 2022-09-19 NOTE — Telephone Encounter (Signed)
Patient's wife, Dorothy Landgrebe, called to state patient needs a refill for his doxazosin (CARDURA) 8 MG tablet.  Mardene Celeste states patient is just about out of this medication.  Mardene Celeste states patient's preferred pharmacy is [facility] on [address].

## 2022-09-27 ENCOUNTER — Ambulatory Visit: Payer: PPO | Admitting: Dermatology

## 2022-09-27 DIAGNOSIS — Z86018 Personal history of other benign neoplasm: Secondary | ICD-10-CM

## 2022-09-27 DIAGNOSIS — Z1283 Encounter for screening for malignant neoplasm of skin: Secondary | ICD-10-CM

## 2022-09-27 DIAGNOSIS — L929 Granulomatous disorder of the skin and subcutaneous tissue, unspecified: Secondary | ICD-10-CM | POA: Diagnosis not present

## 2022-09-27 DIAGNOSIS — L578 Other skin changes due to chronic exposure to nonionizing radiation: Secondary | ICD-10-CM

## 2022-09-27 DIAGNOSIS — D229 Melanocytic nevi, unspecified: Secondary | ICD-10-CM | POA: Diagnosis not present

## 2022-09-27 DIAGNOSIS — Z872 Personal history of diseases of the skin and subcutaneous tissue: Secondary | ICD-10-CM | POA: Diagnosis not present

## 2022-09-27 DIAGNOSIS — L814 Other melanin hyperpigmentation: Secondary | ICD-10-CM

## 2022-09-27 DIAGNOSIS — Z85820 Personal history of malignant melanoma of skin: Secondary | ICD-10-CM | POA: Diagnosis not present

## 2022-09-27 DIAGNOSIS — L219 Seborrheic dermatitis, unspecified: Secondary | ICD-10-CM | POA: Diagnosis not present

## 2022-09-27 NOTE — Progress Notes (Unsigned)
Follow-Up Visit   Subjective  Rodney Branch is a 88 y.o. male who presents for the following: Total body skin exam (Hx of Melanoma R abdomen, R upper back, hx of Dysplastic Nevi, hx of AKs).  Patient accompanied by wife who contributes to history.  The patient presents for Total-Body Skin Exam (TBSE) for skin cancer screening and mole check.  The patient has spots, moles and lesions to be evaluated, some may be new or changing and the patient has concerns that these could be cancer.   The following portions of the chart were reviewed this encounter and updated as appropriate:   Tobacco  Allergies  Meds  Problems  Med Hx  Surg Hx  Fam Hx     Review of Systems:  No other skin or systemic complaints except as noted in HPI or Assessment and Plan.  Objective  Well appearing patient in no apparent distress; mood and affect are within normal limits.  A full examination was performed including scalp, head, eyes, ears, nose, lips, neck, chest, axillae, abdomen, back, buttocks, bilateral upper extremities, bilateral lower extremities, hands, feet, fingers, toes, fingernails, and toenails. All findings within normal limits unless otherwise noted below.  R abdomen, Right Upper Back Well healed scars with no evidence of recurrence, no lymphadenopathy.   R post ear Induration R post ear   Assessment & Plan   Lentigines - Scattered tan macules - Due to sun exposure - Benign-appearing, observe - Recommend daily broad spectrum sunscreen SPF 30+ to sun-exposed areas, reapply every 2 hours as needed. - Call for any changes  Seborrheic Keratoses - Stuck-on, waxy, tan-brown papules and/or plaques  - Benign-appearing - Discussed benign etiology and prognosis. - Observe - Call for any changes  Melanocytic Nevi - Tan-brown and/or pink-flesh-colored symmetric macules and papules - Benign appearing on exam today - Observation - Call clinic for new or changing moles - Recommend daily  use of broad spectrum spf 30+ sunscreen to sun-exposed areas.   Hemangiomas - Red papules - Discussed benign nature - Observe - Call for any changes  Actinic Damage - Chronic condition, secondary to cumulative UV/sun exposure - diffuse scaly erythematous macules with underlying dyspigmentation - Recommend daily broad spectrum sunscreen SPF 30+ to sun-exposed areas, reapply every 2 hours as needed.  - Staying in the shade or wearing long sleeves, sun glasses (UVA+UVB protection) and wide brim hats (4-inch brim around the entire circumference of the hat) are also recommended for sun protection.  - Call for new or changing lesions.  Skin cancer screening performed today.   History of Dysplastic Nevi - No evidence of recurrence today - Recommend regular full body skin exams - Recommend daily broad spectrum sunscreen SPF 30+ to sun-exposed areas, reapply every 2 hours as needed.  - Call if any new or changing lesions are noted between office visits  - multiple  History of PreCancerous Actinic Keratosis  - site(s) of PreCancerous Actinic Keratosis clear today. - these may recur and new lesions may form requiring treatment to prevent transformation into skin cancer - observe for new or changing spots and contact [place] for appointment if occur - photoprotection with sun protective clothing; sunglasses and broad spectrum sunscreen with SPF of at least 30 + and frequent self skin exams recommended - yearly exams by a dermatologist recommended for persons with history of PreCancerous Actinic Keratoses   History of melanoma (2) R abdomen; Right Upper Back  R abdomen 2015 R upper back 08/2021  Clear. No Lymphadenopathy. Observe for recurrence. Call clinic for new or changing lesions.  Recommend regular skin exams, daily broad-spectrum spf 30+ sunscreen use, and photoprotection.    Granuloma Fissuratum of skin R post ear Associated with eyeglasses pressure Recommend pt have  glasses adjusted  Return in about 3 months (around 12/28/2022) for TBSE, Hx of Melanoma, Hx of Dysplastic nevi, Hx of AKs.  I, [name], RMA, am acting as scribe for Sarina Ser, MD .  Documentation: I have reviewed the above documentation for accuracy and completeness, and I agree with the above.  Sarina Ser, MD

## 2022-09-27 NOTE — Patient Instructions (Signed)
Due to recent changes in healthcare laws, you may see results of your pathology and/or laboratory studies on MyChart before the doctors have had a chance to review them. We understand that in some cases there may be results that are confusing or concerning to you. Please understand that not all results are received at the same time and often the doctors may need to interpret multiple results in order to provide you with the best plan of care or course of treatment. Therefore, we ask that you please give us 2 business days to thoroughly review all your results before contacting the office for clarification. Should we see a critical lab result, you will be contacted sooner.   If You Need Anything After Your Visit  If you have any questions or concerns for your doctor, please call our main line at [phone] and press option 4 to reach your doctor's medical assistant. If no one answers, please leave a voicemail as directed and we will return your call as soon as possible. Messages left after 4 pm will be answered the following business day.   You may also send us a message via MyChart. We typically respond to MyChart messages within 1-2 business days.  For prescription refills, please ask your pharmacy to contact our office. Our fax number is [phone].  If you have an urgent issue when the clinic is closed that cannot wait until the next business day, you can page your doctor at the number below.    Please note that while we do our best to be available for urgent issues outside of office hours, we are not available 24/7.   If you have an urgent issue and are unable to reach us, you may choose to seek medical care at your doctor's office, retail clinic, urgent care center, or emergency room.  If you have a medical emergency, please immediately call 911 or go to the emergency department.  Pager Numbers  - Dr. Kowalski: [phone]  - Dr. Moye: [phone]  - Dr. Stewart:  [phone]  In the event of inclement weather, please call our main line at [phone] for an update on the status of any delays or closures.  Dermatology Medication Tips: Please keep the boxes that topical medications come in in order to help keep track of the instructions about where and how to use these. Pharmacies typically print the medication instructions only on the boxes and not directly on the medication tubes.   If your medication is too expensive, please contact our office at [phone] option 4 or send us a message through MyChart.   We are unable to tell what your co-pay for medications will be in advance as this is different depending on your insurance coverage. However, we may be able to find a substitute medication at lower cost or fill out paperwork to get insurance to cover a needed medication.   If a prior authorization is required to get your medication covered by your insurance company, please allow us 1-2 business days to complete this process.  Drug prices often vary depending on where the prescription is filled and some pharmacies may offer cheaper prices.  The website www.goodrx.com contains coupons for medications through different pharmacies. The prices here do not account for what the cost may be with help from insurance (it may be cheaper with your insurance), but the website can give you the price if you did not use any insurance.  - You can print the associated coupon and take it with   your prescription to the pharmacy.  - You may also stop by our office during regular business hours and pick up a GoodRx coupon card.  - If you need your prescription sent electronically to a different pharmacy, notify our office through [facility] MyChart or by phone at [phone] option 4.     Si Usted Necesita Algo Despus de Su Visita  Tambin puede enviarnos un mensaje a travs de MyChart. Por lo general respondemos a los mensajes de MyChart en el transcurso de 1 a 2  das hbiles.  Para renovar recetas, por favor pida a su farmacia que se ponga en contacto con nuestra oficina. Nuestro nmero de fax es el [phone].  Si tiene un asunto urgente cuando la clnica est cerrada y que no puede esperar hasta el siguiente da hbil, puede llamar/localizar a su doctor(a) al nmero que aparece a continuacin.   Por favor, tenga en cuenta que aunque hacemos todo lo posible para estar disponibles para asuntos urgentes fuera del horario de oficina, no estamos disponibles las 24 horas del da, los 7 das de la semana.   Si tiene un problema urgente y no puede comunicarse con nosotros, puede optar por buscar atencin mdica  en el consultorio de su doctor(a), en una clnica privada, en un centro de atencin urgente o en una sala de emergencias.  Si tiene una emergencia mdica, por favor llame inmediatamente al 911 o vaya a la sala de emergencias.  Nmeros de bper  - Dr. Kowalski: [phone]  - Dra. Moye: [phone]  - Dra. Stewart: [phone]  En caso de inclemencias del tiempo, por favor llame a nuestra lnea principal al [phone] para una actualizacin sobre el estado de cualquier retraso o cierre.  Consejos para la medicacin en dermatologa: Por favor, guarde las cajas en las que vienen los medicamentos de uso tpico para ayudarle a seguir las instrucciones sobre dnde y cmo usarlos. Las farmacias generalmente imprimen las instrucciones del medicamento slo en las cajas y no directamente en los tubos del medicamento.   Si su medicamento es muy caro, por favor, pngase en contacto con nuestra oficina llamando al [phone] y presione la opcin 4 o envenos un mensaje a travs de MyChart.   No podemos decirle cul ser su copago por los medicamentos por adelantado ya que esto es diferente dependiendo de la cobertura de su seguro. Sin embargo, es posible que podamos encontrar un medicamento sustituto a menor costo o llenar un formulario para que el  seguro cubra el medicamento que se considera necesario.   Si se requiere una autorizacin previa para que su compaa de seguros cubra su medicamento, por favor permtanos de 1 a 2 das hbiles para completar este proceso.  Los precios de los medicamentos varan con frecuencia dependiendo del lugar de dnde se surte la receta y alguna farmacias pueden ofrecer precios ms baratos.  El sitio web www.goodrx.com tiene cupones para medicamentos de diferentes farmacias. Los precios aqu no tienen en cuenta lo que podra costar con la ayuda del seguro (puede ser ms barato con su seguro), pero el sitio web puede darle el precio si no utiliz ningn seguro.  - Puede imprimir el cupn correspondiente y llevarlo con su receta a la farmacia.  - Tambin puede pasar por nuestra oficina durante el horario de atencin regular y recoger una tarjeta de cupones de GoodRx.  - Si necesita que su receta se enve electrnicamente a una farmacia diferente, informe a nuestra oficina a travs de MyChart de [facility]   o por telfono llamando al [phone] y presione la opcin 4.  

## 2022-09-28 ENCOUNTER — Encounter: Payer: Self-pay | Admitting: Dermatology

## 2022-11-23 DIAGNOSIS — H43813 Vitreous degeneration, bilateral: Secondary | ICD-10-CM | POA: Diagnosis not present

## 2022-12-13 ENCOUNTER — Ambulatory Visit: Payer: PPO | Admitting: Psychiatry

## 2022-12-28 ENCOUNTER — Ambulatory Visit: Payer: PPO | Admitting: Dermatology

## 2022-12-28 VITALS — BP 116/77 | HR 96

## 2022-12-28 DIAGNOSIS — D229 Melanocytic nevi, unspecified: Secondary | ICD-10-CM | POA: Diagnosis not present

## 2022-12-28 DIAGNOSIS — Z85820 Personal history of malignant melanoma of skin: Secondary | ICD-10-CM

## 2022-12-28 DIAGNOSIS — D485 Neoplasm of uncertain behavior of skin: Secondary | ICD-10-CM

## 2022-12-28 DIAGNOSIS — L821 Other seborrheic keratosis: Secondary | ICD-10-CM | POA: Diagnosis not present

## 2022-12-28 DIAGNOSIS — Z79899 Other long term (current) drug therapy: Secondary | ICD-10-CM

## 2022-12-28 DIAGNOSIS — Z1283 Encounter for screening for malignant neoplasm of skin: Secondary | ICD-10-CM

## 2022-12-28 DIAGNOSIS — C44212 Basal cell carcinoma of skin of right ear and external auricular canal: Secondary | ICD-10-CM

## 2022-12-28 DIAGNOSIS — L219 Seborrheic dermatitis, unspecified: Secondary | ICD-10-CM

## 2022-12-28 DIAGNOSIS — L814 Other melanin hyperpigmentation: Secondary | ICD-10-CM | POA: Diagnosis not present

## 2022-12-28 DIAGNOSIS — L578 Other skin changes due to chronic exposure to nonionizing radiation: Secondary | ICD-10-CM

## 2022-12-28 DIAGNOSIS — Z86018 Personal history of other benign neoplasm: Secondary | ICD-10-CM

## 2022-12-28 DIAGNOSIS — C4491 Basal cell carcinoma of skin, unspecified: Secondary | ICD-10-CM

## 2022-12-28 HISTORY — DX: Basal cell carcinoma of skin, unspecified: C44.91

## 2022-12-28 MED ORDER — HYDROCORTISONE 2.5 % EX CREA: TOPICAL_CREAM | CUTANEOUS | 11 refills | Status: DC

## 2022-12-28 NOTE — Patient Instructions (Addendum)
Wound Care Instructions  Cleanse wound gently with soap and water once a day then pat dry with clean gauze. Apply a thin coat of Petrolatum (petroleum jelly, "Vaseline") over the wound (unless you have an allergy to this). We recommend that you use a new, sterile tube of Vaseline. Do not pick or remove scabs. Do not remove the yellow or white "healing tissue" from the base of the wound.  Cover the wound with fresh, clean, nonstick gauze and secure with paper tape. You may use Band-Aids in place of gauze and tape if the wound is small enough, but would recommend trimming much of the tape off as there is often too much. Sometimes Band-Aids can irritate the skin.  You should call the office for your biopsy report after 1 week if you have not already been contacted.  If you experience any problems, such as abnormal amounts of bleeding, swelling, significant bruising, significant pain, or evidence of infection, please call the office immediately.  FOR ADULT SURGERY PATIENTS: If you need something for pain relief you may take 1 extra strength Tylenol (acetaminophen) AND 2 Ibuprofen (200mg each) together every 4 hours as needed for pain. (do not take these if you are allergic to them or if you have a reason you should not take them.) Typically, you may only need pain medication for 1 to 3 days.     Due to recent changes in healthcare laws, you may see results of your pathology and/or laboratory studies on MyChart before the doctors have had a chance to review them. We understand that in some cases there may be results that are confusing or concerning to you. Please understand that not all results are received at the same time and often the doctors may need to interpret multiple results in order to provide you with the best plan of care or course of treatment. Therefore, we ask that you please give us 2 business days to thoroughly review all your results before contacting the office for clarification. Should  we see a critical lab result, you will be contacted sooner.   If You Need Anything After Your Visit  If you have any questions or concerns for your doctor, please call our main line at [phone] and press option 4 to reach your doctor's medical assistant. If no one answers, please leave a voicemail as directed and we will return your call as soon as possible. Messages left after 4 pm will be answered the following business day.   You may also send us a message via MyChart. We typically respond to MyChart messages within 1-2 business days.  For prescription refills, please ask your pharmacy to contact our office. Our fax number is [phone].  If you have an urgent issue when the clinic is closed that cannot wait until the next business day, you can page your doctor at the number below.    Please note that while we do our best to be available for urgent issues outside of office hours, we are not available 24/7.   If you have an urgent issue and are unable to reach us, you may choose to seek medical care at your doctor's office, retail clinic, urgent care center, or emergency room.  If you have a medical emergency, please immediately call 911 or go to the emergency department.  Pager Numbers  - Dr. Kowalski: [phone]  - Dr. Moye: [phone]  - Dr. Stewart: [phone]  In the event of inclement weather, please call our main line at   [phone] for an update on the status of any delays or closures.  Dermatology Medication Tips: Please keep the boxes that topical medications come in in order to help keep track of the instructions about where and how to use these. Pharmacies typically print the medication instructions only on the boxes and not directly on the medication tubes.   If your medication is too expensive, please contact our office at [phone] option 4 or send us a message through MyChart.   We are unable to tell what your co-pay for medications will be in  advance as this is different depending on your insurance coverage. However, we may be able to find a substitute medication at lower cost or fill out paperwork to get insurance to cover a needed medication.   If a prior authorization is required to get your medication covered by your insurance company, please allow us 1-2 business days to complete this process.  Drug prices often vary depending on where the prescription is filled and some pharmacies may offer cheaper prices.  The website www.goodrx.com contains coupons for medications through different pharmacies. The prices here do not account for what the cost may be with help from insurance (it may be cheaper with your insurance), but the website can give you the price if you did not use any insurance.  - You can print the associated coupon and take it with your prescription to the pharmacy.  - You may also stop by our office during regular business hours and pick up a GoodRx coupon card.  - If you need your prescription sent electronically to a different pharmacy, notify our office through [facility] MyChart or by phone at [phone] option 4.     Si Usted Necesita Algo Despus de Su Visita  Tambin puede enviarnos un mensaje a travs de MyChart. Por lo general respondemos a los mensajes de MyChart en el transcurso de 1 a 2 das hbiles.  Para renovar recetas, por favor pida a su farmacia que se ponga en contacto con nuestra oficina. Nuestro nmero de fax es el [phone].  Si tiene un asunto urgente cuando la clnica est cerrada y que no puede esperar hasta el siguiente da hbil, puede llamar/localizar a su doctor(a) al nmero que aparece a continuacin.   Por favor, tenga en cuenta que aunque hacemos todo lo posible para estar disponibles para asuntos urgentes fuera del horario de oficina, no estamos disponibles las 24 horas del da, los 7 das de la semana.   Si tiene un problema urgente y no puede comunicarse con nosotros, puede  optar por buscar atencin mdica  en el consultorio de su doctor(a), en una clnica privada, en un centro de atencin urgente o en una sala de emergencias.  Si tiene una emergencia mdica, por favor llame inmediatamente al 911 o vaya a la sala de emergencias.  Nmeros de bper  - Dr. Kowalski: [phone]  - Dra. Moye: [phone]  - Dra. Stewart: [phone]  En caso de inclemencias del tiempo, por favor llame a nuestra lnea principal al [phone] para una actualizacin sobre el estado de cualquier retraso o cierre.  Consejos para la medicacin en dermatologa: Por favor, guarde las cajas en las que vienen los medicamentos de uso tpico para ayudarle a seguir las instrucciones sobre dnde y cmo usarlos. Las farmacias generalmente imprimen las instrucciones del medicamento slo en las cajas y no directamente en los tubos del medicamento.   Si su medicamento es muy caro, por favor, pngase en contacto con   nuestra oficina llamando al [phone] y presione la opcin 4 o envenos un mensaje a travs de MyChart.   No podemos decirle cul ser su copago por los medicamentos por adelantado ya que esto es diferente dependiendo de la cobertura de su seguro. Sin embargo, es posible que podamos encontrar un medicamento sustituto a menor costo o llenar un formulario para que el seguro cubra el medicamento que se considera necesario.   Si se requiere una autorizacin previa para que su compaa de seguros cubra su medicamento, por favor permtanos de 1 a 2 das hbiles para completar este proceso.  Los precios de los medicamentos varan con frecuencia dependiendo del lugar de dnde se surte la receta y alguna farmacias pueden ofrecer precios ms baratos.  El sitio web www.goodrx.com tiene cupones para medicamentos de diferentes farmacias. Los precios aqu no tienen en cuenta lo que podra costar con la ayuda del seguro (puede ser ms barato con su seguro), pero el sitio web puede darle el  precio si no utiliz ningn seguro.  - Puede imprimir el cupn correspondiente y llevarlo con su receta a la farmacia.  - Tambin puede pasar por nuestra oficina durante el horario de atencin regular y recoger una tarjeta de cupones de GoodRx.  - Si necesita que su receta se enve electrnicamente a una farmacia diferente, informe a nuestra oficina a travs de MyChart de [facility] o por telfono llamando al [phone] y presione la opcin 4.  

## 2022-12-28 NOTE — Progress Notes (Signed)
Follow-Up Visit   Subjective  Rodney Branch is a 89 y.o. male who presents for the following: Annual Exam (Hx MM, dysplastic nevi ). The patient presents for Total-Body Skin Exam (TBSE) for skin cancer screening and mole check.  The patient has spots, moles and lesions to be evaluated, some may be new or changing and the patient has concerns that these could be cancer.  The following portions of the chart were reviewed this encounter and updated as appropriate:   Tobacco  Allergies  Meds  Problems  Med Hx  Surg Hx  Fam Hx     Review of Systems:  No other skin or systemic complaints except as noted in HPI or Assessment and Plan.  Objective  Well appearing patient in no apparent distress; mood and affect are within normal limits.  A full examination was performed including scalp, head, eyes, ears, nose, lips, neck, chest, axillae, abdomen, back, buttocks, bilateral upper extremities, bilateral lower extremities, hands, feet, fingers, toes, fingernails, and toenails. All findings within normal limits unless otherwise noted below.  Post auricular Pink patches with greasy scale.   Right Ear Posterior Ulceration 0.6 x 0.5 cm      Assessment & Plan  Seborrheic dermatitis Post auricular Seborrheic Dermatitis  -  is a chronic persistent rash characterized by pinkness and scaling most commonly of the mid face but also can occur on the scalp (dandruff), ears; mid chest, mid back and groin.  It tends to be exacerbated by stress and cooler weather.  People who have neurologic disease may experience new onset or exacerbation of existing seborrheic dermatitis.  The condition is not curable but treatable and can be controlled.  Continue HC 2.5% cream continue to aa's 3d/wk.  Related Medications hydrocortisone 2.5 % cream Apply topically 3 (three) times a week. Apply to scaly rash on chest and behind ears 3 days a week, Monday, Wednesday and Fridays  Neoplasm of uncertain behavior of  skin Right Ear Posterior Skin / nail biopsy Type of biopsy: tangential   Informed consent: discussed and consent obtained   Timeout: patient name, date of birth, surgical site, and procedure verified   Procedure prep:  Patient was prepped and draped in usual sterile fashion Prep type:  Isopropyl alcohol Anesthesia: the lesion was anesthetized in a standard fashion   Anesthetic:  1% lidocaine w/ epinephrine 1-100,000 buffered w/ 8.4% NaHCO3 Instrument used: flexible razor blade   Hemostasis achieved with: pressure, aluminum chloride and electrodesiccation   Outcome: patient tolerated procedure well   Post-procedure details: sterile dressing applied and wound care instructions given   Dressing type: bandage and petrolatum    Specimen 1 - Surgical pathology Differential Diagnosis: D48.5 r/o CA vs ulceration from glasses rubbing Check Margins: No  Lentigines - Scattered tan macules - Due to sun exposure - Benign-appearing, observe - Recommend daily broad spectrum sunscreen SPF 30+ to sun-exposed areas, reapply every 2 hours as needed. - Call for any changes  Seborrheic Keratoses - Stuck-on, waxy, tan-brown papules and/or plaques  - Benign-appearing - Discussed benign etiology and prognosis. - Observe - Call for any changes  Melanocytic Nevi - Tan-brown and/or pink-flesh-colored symmetric macules and papules - Benign appearing on exam today - Observation - Call clinic for new or changing moles - Recommend daily use of broad spectrum spf 30+ sunscreen to sun-exposed areas.   Hemangiomas - Red papules - Discussed benign nature - Observe - Call for any changes  Actinic Damage - Chronic condition, secondary to cumulative  UV/sun exposure - diffuse scaly erythematous macules with underlying dyspigmentation - Recommend daily broad spectrum sunscreen SPF 30+ to sun-exposed areas, reapply every 2 hours as needed.  - Staying in the shade or wearing long sleeves, sun glasses  (UVA+UVB protection) and wide brim hats (4-inch brim around the entire circumference of the hat) are also recommended for sun protection.  - Call for new or changing lesions.  Skin cancer screening performed today.  History of Dysplastic Nevi - No evidence of recurrence today - Recommend regular full body skin exams - Recommend daily broad spectrum sunscreen SPF 30+ to sun-exposed areas, reapply every 2 hours as needed.  - Call if any new or changing lesions are noted between office visits  History of Melanoma - No evidence of recurrence today - No lymphadenopathy - Recommend regular full body skin exams - Recommend daily broad spectrum sunscreen SPF 30+ to sun-exposed areas, reapply every 2 hours as needed.  - Call if any new or changing lesions are noted between office visits  Return in about 6 months (around 06/28/2023) for TBSE.  [name], CMA, am acting as scribe for Sarina Ser, MD . Documentation: I have reviewed the above documentation for accuracy and completeness, and I agree with the above.  Sarina Ser, MD

## 2022-12-31 ENCOUNTER — Encounter: Payer: Self-pay | Admitting: Dermatology

## 2023-01-04 ENCOUNTER — Ambulatory Visit
Admission: RE | Admit: 2023-01-04 | Discharge: 2023-01-04 | Disposition: A | Discharge status: Home / Self Care | Payer: PPO | Source: Ambulatory Visit | Attending: Nurse Practitioner | Admitting: Nurse Practitioner

## 2023-01-04 ENCOUNTER — Telehealth: Payer: Self-pay

## 2023-01-04 ENCOUNTER — Ambulatory Visit (INDEPENDENT_AMBULATORY_CARE_PROVIDER_SITE_OTHER): Payer: PPO | Admitting: Nurse Practitioner

## 2023-01-04 ENCOUNTER — Encounter: Payer: Self-pay | Admitting: Nurse Practitioner

## 2023-01-04 VITALS — BP 138/78 | HR 98 | Temp 99.7°F | Ht 67.0 in | Wt 140.8 lb

## 2023-01-04 DIAGNOSIS — M25551 Pain in right hip: Secondary | ICD-10-CM

## 2023-01-04 NOTE — Telephone Encounter (Signed)
Pts wife called back and she was informed to take pt to the ER. Per Dr. Biagio Quint she could start him on tylenol; for the pain but that he strongly suggested he go to the ER for advanced imaging on the hip and leg. Pts wife stated she will just wait until tomorrow because neither one of them have eaten anything today and they don't want to be sitting in the ER for 8-10 hours with out food.

## 2023-01-04 NOTE — Telephone Encounter (Signed)
Left voicemail for patient to return my call. 

## 2023-01-04 NOTE — Progress Notes (Signed)
Tomasita Morrow, NP-C Phone: [phone]  Rodney Branch is a 89 y.o. male who presents today for right hip pain.   Hip Pain: Patient complains of right hip pain. Onset of the symptoms was today. Inciting event: none. Current symptoms include is worse with weight bearing and radiates to groin and knee . Aggravating symptoms: any weight bearing and movement. He is unable to pick up or move his right leg . Pain has been constant since approximately 09:00 this morning. He rates his pain at 10/10. Patient has had no prior hip problems. Previous visits for this problem: none. Evaluation to date: none.  Treatment to date: none. Patient presents in a wheelchair as he is unable to ambulate due to the pain. Denies any recent injury or fall.   Social History   Tobacco Use  Smoking Status Never  Smokeless Tobacco Never    Current Outpatient Medications on File Prior to Visit  Medication Sig Dispense Refill   doxazosin (CARDURA) 8 MG tablet Take 1 tablet (8 mg total) by mouth daily. 90 tablet 1   FLUAD QUADRIVALENT 0.5 ML injection      hydrocortisone 2.5 % cream Apply topically 3 (three) times a week. Apply to scaly rash on chest and behind ears 3 days a week, Monday, Wednesday and Fridays 45 g 11   omeprazole (PRILOSEC) 20 MG capsule Take 1 capsule by mouth once daily 90 capsule 0   PARoxetine (PAXIL) 40 MG tablet Take 1 tablet (40 mg total) by mouth every morning. 90 tablet 3   polyethylene glycol powder (GLYCOLAX/MIRALAX) 17 GM/SCOOP powder Take 17 g by mouth daily as needed. 850 g 0   primidone (MYSOLINE) 50 MG tablet Take 3 tablets (150 mg total) by mouth at bedtime. 270 tablet 3   QUEtiapine (SEROQUEL) 400 MG tablet Take 2 tablets (800 mg total) by mouth at bedtime. 180 tablet 3   Cholecalciferol (VITAMIN D) 50 MCG (2000 UT) CAPS Take 1 capsule by mouth daily. (Patient not taking: Reported on 01/04/2023)     vitamin B-12 (CYANOCOBALAMIN) 1000 MCG tablet Take 1,000 mcg by mouth daily. (Patient not  taking: Reported on 01/04/2023)     No current facility-administered medications on file prior to visit.     ROS see history of present illness  Objective  Physical Exam Vitals:   01/04/23 1446 01/04/23 1506  BP: (!) 140/78 138/78  Pulse: 98   Temp: 99.7 F (37.6 C)   SpO2: 98%     BP Readings from Last 3 Encounters:  01/04/23 138/78  12/28/22 116/77  06/08/22 110/80   Wt Readings from Last 3 Encounters:  01/04/23 140 lb 12.8 oz (63.9 kg)  07/07/22 133 lb (60.3 kg)  06/08/22 133 lb 9.6 oz (60.6 kg)    Physical Exam Constitutional:      General: He is not in acute distress.    Appearance: Normal appearance.  HENT:     Head: Normocephalic.  Cardiovascular:     Rate and Rhythm: Normal rate and regular rhythm.     Heart sounds: Normal heart sounds.  Pulmonary:     Effort: Pulmonary effort is normal.     Breath sounds: Normal breath sounds.  Musculoskeletal:     Right hip: Tenderness (in joint, into groin) present. Decreased range of motion (unable to move on own). Decreased strength.     Left hip: Normal.  Skin:    General: Skin is warm and dry.  Neurological:     General: No focal deficit present.  Mental Status: He is alert.  Psychiatric:        Mood and Affect: Mood normal.        Behavior: Behavior normal.    Assessment/Plan: Please see individual problem list.  Right hip pain Assessment & Plan: Concern for fracture. [facility]ray at [facility]. Will contact patient with results.   Orders: -     DG HIP UNILAT W OR W/O PELVIS 2-3 VIEWS RIGHT; Future   Tomasita Morrow, NP-C [place]

## 2023-01-04 NOTE — Telephone Encounter (Signed)
-----   Message from Ralene Bathe, MD sent at 01/03/2023  6:48 PM EST ----- Diagnosis Skin , right ear posterior BASAL CELL CARCINOMA, NODULAR PATTERN  Cancer - BCC Schedule surgery

## 2023-01-04 NOTE — Telephone Encounter (Signed)
Pt spouse called stating that the xray is normal but the pt is still in pain and want pain killers. Pt spouse is going to give him tylenol

## 2023-01-04 NOTE — Assessment & Plan Note (Addendum)
Concern for fracture. X-ray at [facility]. Will contact patient with results.

## 2023-01-04 NOTE — Telephone Encounter (Signed)
This should have been sent to the provider that saw the patient.

## 2023-01-04 NOTE — Telephone Encounter (Signed)
Left detailed vm on phone stating that the xray was normal and that they can leave the hospital. I called the spouse and sons phone and no answer.

## 2023-01-05 ENCOUNTER — Telehealth: Payer: Self-pay

## 2023-01-05 NOTE — Telephone Encounter (Signed)
-----   Message from Ralene Bathe, MD sent at 01/03/2023  6:48 PM EST ----- Diagnosis Skin , right ear posterior BASAL CELL CARCINOMA, NODULAR PATTERN  Cancer - BCC Schedule surgery

## 2023-01-05 NOTE — Telephone Encounter (Signed)
Patient's spouse advised of information and surgery scheduled. aw

## 2023-01-05 NOTE — Telephone Encounter (Signed)
Noted. If he continues to have significant pain or worsening pain he needs to be reevaluated.

## 2023-01-05 NOTE — Telephone Encounter (Signed)
Pete Pelt, patient's wife, states she wanted to let Dr. [name] know that they did not go to the ED yesterday.  Fraser Din states they applied a heating pad to patient last night and it helped.  Fraser Din states a walker should be coming later today.  Fraser Din states patient seems to be getting better.

## 2023-01-05 NOTE — Telephone Encounter (Signed)
Agree with documentation. Please follow-up with the patient to make sure he was evaluated for this.

## 2023-01-07 ENCOUNTER — Other Ambulatory Visit: Payer: Self-pay | Admitting: Psychiatry

## 2023-01-07 DIAGNOSIS — F319 Bipolar disorder, unspecified: Secondary | ICD-10-CM

## 2023-01-31 ENCOUNTER — Ambulatory Visit: Payer: PPO | Admitting: Dermatology

## 2023-01-31 ENCOUNTER — Encounter: Payer: Self-pay | Admitting: Dermatology

## 2023-01-31 VITALS — BP 123/76 | HR 83

## 2023-01-31 DIAGNOSIS — C44212 Basal cell carcinoma of skin of right ear and external auricular canal: Secondary | ICD-10-CM | POA: Diagnosis not present

## 2023-01-31 MED ORDER — MUPIROCIN 2 % EX OINT: 1.0000 | TOPICAL_OINTMENT | Freq: Every day | CUTANEOUS | 0 refills | Status: DC

## 2023-01-31 NOTE — Patient Instructions (Addendum)

## 2023-01-31 NOTE — Progress Notes (Signed)
   Follow-Up Visit   Subjective  Rodney Branch is a 89 y.o. male who presents for the following: BCC bx proven (R ear posterior, pt presents for excision).  Patient accompanied by wife who contributes to history.  The following portions of the chart were reviewed this encounter and updated as appropriate:   Tobacco  Allergies  Meds  Problems  Med Hx  Surg Hx  Fam Hx     Review of Systems:  No other skin or systemic complaints except as noted in HPI or Assessment and Plan.  Objective  Well appearing patient in no apparent distress; mood and affect are within normal limits.  A focused examination was performed including right posterior ear. Relevant physical exam findings are noted in the Assessment and Plan.  R ear posterior Pink bx site 2.0 x 1.2   Assessment & Plan  Basal cell carcinoma (BCC) of skin of right ear R ear posterior  Skin excision  Lesion length (cm):  2 Lesion width (cm):  1.2 Margin per side (cm):  0.2 Total excision diameter (cm):  2.4 Informed consent: discussed and consent obtained   Timeout: patient name, date of birth, surgical site, and procedure verified   Procedure prep:  Patient was prepped and draped in usual sterile fashion Prep type:  Isopropyl alcohol and povidone-iodine Anesthesia: the lesion was anesthetized in a standard fashion   Anesthetic:  1% lidocaine w/ epinephrine 1-100,000 buffered w/ 8.4% NaHCO3 (3cc lido w/ epi) Instrument used comment:  #15c blade Hemostasis achieved with: pressure and Gelfoam   Hemostasis achieved with comment:  Electrocautery Outcome: patient tolerated procedure well with no complications   Post-procedure details: sterile dressing applied and wound care instructions given   Dressing type: bandage, pressure dressing and bacitracin (Mupirocin)   Additional details:  2ndary intention healing  mupirocin ointment (BACTROBAN) 2 % Apply 1 Application topically daily. Qd to excision site  Specimen 1 -  Surgical pathology Differential Diagnosis: Bx proven BCC  Check Margins: yes Pink bx site 2.0 x 1.2cm [id number] Tagged at superior 12:00 edge  BCC bx proven, excised today Start Mupirocin oint qd to excision site   Return in about 1 week (around 02/07/2023) for excision f/u.  I, [name], RMA, am acting as scribe for Sarina Ser, MD . Documentation: I have reviewed the above documentation for accuracy and completeness, and I agree with the above.  Sarina Ser, MD

## 2023-02-01 ENCOUNTER — Telehealth: Payer: Self-pay

## 2023-02-01 NOTE — Telephone Encounter (Signed)
Spoke to patients wife and patient is doing fine after yesterdays surgery.Rodney Branch

## 2023-02-03 ENCOUNTER — Telehealth: Payer: Self-pay | Admitting: Family Medicine

## 2023-02-03 DIAGNOSIS — N400 Enlarged prostate without lower urinary tract symptoms: Secondary | ICD-10-CM

## 2023-02-03 MED ORDER — DOXAZOSIN MESYLATE 8 MG PO TABS: 8.0000 mg | ORAL_TABLET | Freq: Every day | ORAL | 1 refills | Status: DC

## 2023-02-03 NOTE — Telephone Encounter (Signed)
Refill sent.

## 2023-02-03 NOTE — Telephone Encounter (Signed)
Pt wife called stating pt need a refill on doxazosin sent to [facility]

## 2023-02-04 ENCOUNTER — Encounter: Payer: Self-pay | Admitting: Dermatology

## 2023-02-06 ENCOUNTER — Encounter: Payer: Self-pay | Admitting: Dermatology

## 2023-02-06 ENCOUNTER — Ambulatory Visit (INDEPENDENT_AMBULATORY_CARE_PROVIDER_SITE_OTHER): Payer: PPO | Admitting: Dermatology

## 2023-02-06 VITALS — BP 88/56 | HR 97

## 2023-02-06 DIAGNOSIS — T148XXA Other injury of unspecified body region, initial encounter: Secondary | ICD-10-CM

## 2023-02-06 DIAGNOSIS — Z4802 Encounter for removal of sutures: Secondary | ICD-10-CM

## 2023-02-06 DIAGNOSIS — S01301A Unspecified open wound of right ear, initial encounter: Secondary | ICD-10-CM

## 2023-02-06 NOTE — Patient Instructions (Signed)
Due to recent changes in healthcare laws, you may see results of your pathology and/or laboratory studies on MyChart before the doctors have had a chance to review them. We understand that in some cases there may be results that are confusing or concerning to you. Please understand that not all results are received at the same time and often the doctors may need to interpret multiple results in order to provide you with the best plan of care or course of treatment. Therefore, we ask that you please give us 2 business days to thoroughly review all your results before contacting the office for clarification. Should we see a critical lab result, you will be contacted sooner.   If You Need Anything After Your Visit  If you have any questions or concerns for your doctor, please call our main line at [phone] and press option 4 to reach your doctor's medical assistant. If no one answers, please leave a voicemail as directed and we will return your call as soon as possible. Messages left after 4 pm will be answered the following business day.   You may also send us a message via MyChart. We typically respond to MyChart messages within 1-2 business days.  For prescription refills, please ask your pharmacy to contact our office. Our fax number is [phone].  If you have an urgent issue when the clinic is closed that cannot wait until the next business day, you can page your doctor at the number below.    Please note that while we do our best to be available for urgent issues outside of office hours, we are not available 24/7.   If you have an urgent issue and are unable to reach us, you may choose to seek medical care at your doctor's office, retail clinic, urgent care center, or emergency room.  If you have a medical emergency, please immediately call 911 or go to the emergency department.  Pager Numbers  - Dr. Kowalski: [phone]  - Dr. Moye: [phone]  - Dr. Stewart:  [phone]  In the event of inclement weather, please call our main line at [phone] for an update on the status of any delays or closures.  Dermatology Medication Tips: Please keep the boxes that topical medications come in in order to help keep track of the instructions about where and how to use these. Pharmacies typically print the medication instructions only on the boxes and not directly on the medication tubes.   If your medication is too expensive, please contact our office at [phone] option 4 or send us a message through MyChart.   We are unable to tell what your co-pay for medications will be in advance as this is different depending on your insurance coverage. However, we may be able to find a substitute medication at lower cost or fill out paperwork to get insurance to cover a needed medication.   If a prior authorization is required to get your medication covered by your insurance company, please allow us 1-2 business days to complete this process.  Drug prices often vary depending on where the prescription is filled and some pharmacies may offer cheaper prices.  The website www.goodrx.com contains coupons for medications through different pharmacies. The prices here do not account for what the cost may be with help from insurance (it may be cheaper with your insurance), but the website can give you the price if you did not use any insurance.  - You can print the associated coupon and take it with   your prescription to the pharmacy.  - You may also stop by our office during regular business hours and pick up a GoodRx coupon card.  - If you need your prescription sent electronically to a different pharmacy, notify our office through [facility] MyChart or by phone at [phone] option 4.     Si Usted Necesita Algo Despus de Su Visita  Tambin puede enviarnos un mensaje a travs de MyChart. Por lo general respondemos a los mensajes de MyChart en el transcurso de 1 a 2  das hbiles.  Para renovar recetas, por favor pida a su farmacia que se ponga en contacto con nuestra oficina. Nuestro nmero de fax es el [phone].  Si tiene un asunto urgente cuando la clnica est cerrada y que no puede esperar hasta el siguiente da hbil, puede llamar/localizar a su doctor(a) al nmero que aparece a continuacin.   Por favor, tenga en cuenta que aunque hacemos todo lo posible para estar disponibles para asuntos urgentes fuera del horario de oficina, no estamos disponibles las 24 horas del da, los 7 das de la semana.   Si tiene un problema urgente y no puede comunicarse con nosotros, puede optar por buscar atencin mdica  en el consultorio de su doctor(a), en una clnica privada, en un centro de atencin urgente o en una sala de emergencias.  Si tiene una emergencia mdica, por favor llame inmediatamente al 911 o vaya a la sala de emergencias.  Nmeros de bper  - Dr. Kowalski: [phone]  - Dra. Moye: [phone]  - Dra. Stewart: [phone]  En caso de inclemencias del tiempo, por favor llame a nuestra lnea principal al [phone] para una actualizacin sobre el estado de cualquier retraso o cierre.  Consejos para la medicacin en dermatologa: Por favor, guarde las cajas en las que vienen los medicamentos de uso tpico para ayudarle a seguir las instrucciones sobre dnde y cmo usarlos. Las farmacias generalmente imprimen las instrucciones del medicamento slo en las cajas y no directamente en los tubos del medicamento.   Si su medicamento es muy caro, por favor, pngase en contacto con nuestra oficina llamando al [phone] y presione la opcin 4 o envenos un mensaje a travs de MyChart.   No podemos decirle cul ser su copago por los medicamentos por adelantado ya que esto es diferente dependiendo de la cobertura de su seguro. Sin embargo, es posible que podamos encontrar un medicamento sustituto a menor costo o llenar un formulario para que el  seguro cubra el medicamento que se considera necesario.   Si se requiere una autorizacin previa para que su compaa de seguros cubra su medicamento, por favor permtanos de 1 a 2 das hbiles para completar este proceso.  Los precios de los medicamentos varan con frecuencia dependiendo del lugar de dnde se surte la receta y alguna farmacias pueden ofrecer precios ms baratos.  El sitio web www.goodrx.com tiene cupones para medicamentos de diferentes farmacias. Los precios aqu no tienen en cuenta lo que podra costar con la ayuda del seguro (puede ser ms barato con su seguro), pero el sitio web puede darle el precio si no utiliz ningn seguro.  - Puede imprimir el cupn correspondiente y llevarlo con su receta a la farmacia.  - Tambin puede pasar por nuestra oficina durante el horario de atencin regular y recoger una tarjeta de cupones de GoodRx.  - Si necesita que su receta se enve electrnicamente a una farmacia diferente, informe a nuestra oficina a travs de MyChart de    o por telfono llamando al [phone] y presione la opcin 4.  

## 2023-02-06 NOTE — Progress Notes (Unsigned)
   Follow-Up Visit   Subjective  Rodney Branch is a 89 y.o. male who presents for the following: OTHER (Patient here with wife today concerning infection at wound surgery site at right ear. Patient's wife states she has been changing dressings daily and noticed some bleeding and redness in area she was concerned about.).  The following portions of the chart were reviewed this encounter and updated as appropriate:  Tobacco  Allergies  Meds  Problems  Med Hx  Surg Hx  Fam Hx     Review of Systems: No other skin or systemic complaints except as noted in HPI or Assessment and Plan.  Objective  Well appearing patient in no apparent distress; mood and affect are within normal limits.  A focused examination was performed including right posterior ear. Relevant physical exam findings are noted in the Assessment and Plan.  right posterior ear Healing wound   Assessment & Plan  Open wound right posterior ear Normal appearing post op wound. No infection. Benign. Observe  Post op surgery for bx proven bcc - margins clear  Wound does not appear to be infected today.   Wound cleansed with purycyn, mupirocin 2 % ointment applied, and covered with non stick guaze and bandage tape. Patient instructed to leave covered until follow up  Return for keep follow up wednesday .  IRuthell Rummage, CMA, am acting as scribe for Sarina Ser, MD. Documentation: I have reviewed the above documentation for accuracy and completeness, and I agree with the above.  Sarina Ser, MD

## 2023-02-07 ENCOUNTER — Encounter: Payer: Self-pay | Admitting: Dermatology

## 2023-02-07 ENCOUNTER — Ambulatory Visit (INDEPENDENT_AMBULATORY_CARE_PROVIDER_SITE_OTHER): Payer: PPO | Admitting: Psychiatry

## 2023-02-07 ENCOUNTER — Encounter: Payer: Self-pay | Admitting: Psychiatry

## 2023-02-07 DIAGNOSIS — F319 Bipolar disorder, unspecified: Secondary | ICD-10-CM

## 2023-02-07 DIAGNOSIS — F5105 Insomnia due to other mental disorder: Secondary | ICD-10-CM

## 2023-02-07 DIAGNOSIS — G250 Essential tremor: Secondary | ICD-10-CM | POA: Diagnosis not present

## 2023-02-07 MED ORDER — QUETIAPINE FUMARATE 300 MG PO TABS: 600.0000 mg | ORAL_TABLET | Freq: Every day | ORAL | 0 refills | Status: DC

## 2023-02-07 MED ORDER — PAROXETINE HCL 30 MG PO TABS: 30.0000 mg | ORAL_TABLET | Freq: Every morning | ORAL | 0 refills | Status: DC

## 2023-02-07 NOTE — Progress Notes (Signed)
SCOUT VIENS [id number] 04-15-1931 89 y.o.  Subjective:   Patient ID:  Rodney Branch is a 89 y.o. (DOB [birth year]) male.  Chief Complaint:  Chief Complaint  Patient presents with   Follow-up    Medication Refill Associated symptoms include arthralgias and neck pain. Pertinent negatives include no weakness.   Rodney Branch presents to the office today for follow-up of bipolar disorder.   Seen with wife.  He reduced the paroxetine to 20 mg for 2 mos in 2019.  Felt worse, then increased back to 40 mg and he and his wife noticed.  Was also sleeping 2 much.  Cut out he wine was on 2 daily and falling asleep.  He stopped it too.  Don't even miss it.  Splits quetiapine 400 mg when goes to sleep and repeats when awakens and less daytime   seen December 2020.  No  Med changes.  10/28/20 appt with following noted: Very careful with Covid.  Mood has remained stable.  Sleep managed.  No mood swings. Works on [employer] and plays cards. Doing well with meds.  Staying asleep better with quetiapine at 800 mg HS instead of splitting pills. Mood great and anxiety under control.  Wife says he could be more patient.  No cycles of depression. Patient reports stable mood and denies depressed or irritable moodsand both he and his wife agree better with Paxil.[name]  Patient denies any recent difficulty with anxiety.  Patient denies difficulty with sleep initiation or maintenance. Denies appetite disturbance.  Patient reports that energy and motivation have been good.  Patient denies any difficulty with concentration.  Patient denies any suicidal ideation.  Wife agrees he's doing well. Tremor managed. Plan: Continue quetiapine 800 mg HS and paroxetine 40 mg daily.  12/08/2021 appointment with the following noted:  seen with wife Had melanoma on back that required extensive surgery.   Happy as hell.   Went off paroxetine in Dec 6 days and restarted.it.  Wife noticed he's irritable. Neither noticed any mood  problems. SE dryness Patient reports stable mood and denies depressed or irritable moods.  Patient denies any recent difficulty with anxiety.  Patient denies difficulty with sleep initiation or maintenance. Denies appetite disturbance.  Patient reports that energy and motivation have been good.  Patient denies any difficulty with concentration.  Patient denies any suicidal ideation. Plan: Continue quetiapine 800 mg HS and paroxetine 40 mg daily.  02/07/23 appt noted:  with wife W got concerned bc him sleeping too much and noticed a couple of weeks ago.  Talked with son.  Pt been combining bottles of pills.  She noticed he mixed up pills in the bottles.   He was confused and sleeping too much but that has cleared up.   Asks about which meds do what. No depression in years.   He doesn't want to reduce quetiapine bc fear of not sleeping.   Past Psychiatric Medication Trials:  Sertraline, paroxetine 40, quetiapine 800.  Olanzapine. Alprazolam.  Review of Systems:  Review of Systems  HENT:  Positive for rhinorrhea.   Cardiovascular:  Negative for palpitations.  Musculoskeletal:  Positive for arthralgias and neck pain. Negative for back pain.  Neurological:  Positive for tremors. Negative for weakness.       Falls unchanged  Psychiatric/Behavioral:  Negative for agitation, behavioral problems, confusion, decreased concentration, dysphoric mood, hallucinations, self-injury, sleep disturbance and suicidal ideas. The patient is not nervous/anxious and is not hyperactive.     Medications: I have reviewed  the patient's current medications.  Current Outpatient Medications  Medication Sig Dispense Refill   Cholecalciferol (VITAMIN D) 50 MCG (2000 UT) CAPS Take 1 capsule by mouth daily.     doxazosin (CARDURA) 8 MG tablet Take 1 tablet (8 mg total) by mouth daily. 90 tablet 1   FLUAD QUADRIVALENT 0.5 ML injection      hydrocortisone 2.5 % cream Apply topically 3 (three) times a week. Apply to scaly  rash on chest and behind ears 3 days a week, Monday, Wednesday and Fridays 45 g 11   mupirocin ointment (BACTROBAN) 2 % Apply 1 Application topically daily. Qd to excision site 22 g 0   omeprazole (PRILOSEC) 20 MG capsule Take 1 capsule by mouth once daily 90 capsule 0   polyethylene glycol powder (GLYCOLAX/MIRALAX) 17 GM/SCOOP powder Take 17 g by mouth daily as needed. 850 g 0   primidone (MYSOLINE) 50 MG tablet Take 3 tablets (150 mg total) by mouth at bedtime. 270 tablet 3   vitamin B-12 (CYANOCOBALAMIN) 1000 MCG tablet Take 1,000 mcg by mouth daily.     PARoxetine (PAXIL) 30 MG tablet Take 1 tablet (30 mg total) by mouth every morning. 90 tablet 0   QUEtiapine (SEROQUEL) 300 MG tablet Take 2 tablets (600 mg total) by mouth at bedtime. 180 tablet 0   No current facility-administered medications for this visit.    Medication Side Effects: None except a little hangover   Allergies:  Allergies  Allergen Reactions   Metoprolol Tartrate Other (See Comments)    Hallucinations   Propranolol     Hallucinations (resolved upon cessation)    Past Medical History:  Diagnosis Date   Actinic keratosis 01/21/2016   R distal lat deltoid - bx proven   Arthritis    Basal cell carcinoma 12/28/2022   R ear posterior - Excised 01/31/23   Benign prostatic hypertrophy 1996   Depression 1996   Psych Admit ([place]) 1996   Dysplastic nevus 09/16/2015   LUQA - moderate   Dysplastic nevus 09/16/2015   R side inf costal area - mod   Dysplastic nevus 02/15/2018   L upper back 4.0 cm lat to spine - mod   Dysplastic nevus 02/15/2018   R post flank sup - mod   Dysplastic nevus 02/15/2018   R post flank inf - mod to severe - excision 05/15/2018   Dysplastic nevus 02/15/2018   L post waistline/sacral - mod   Dysplastic nevus 09/12/2018   R lat periumbilical - mod   Dysplastic nevus [id number]   supra umbilical, mod-sev, margins clear   GERD (gastroesophageal reflux disease)    INSOMNIA, CHRONIC  05/20/2009   STOPPED AMBIEN 2/2 ABUSE   Malignant melanoma ([place]) 2015   R abd ([place])   Malignant melanoma ([place]) 09/01/2021   right upper back   Psychiatric hospitalization 12/1994, 11/2014   self referral/anxiety depression, IVC for irrational behavior   Tremor, essential     Family History  Problem Relation Age of Onset   Depression Mother 47       alzheimers//long history   Alzheimer's disease Mother    Cancer Father 5       cancer of stomach   Alcohol abuse Neg Hx    Diabetes Neg Hx    Stroke Neg Hx    CAD Neg Hx     Social History   Socioeconomic History   Marital status: Married    Spouse name: Not on file   Number of children: 3  Years of education: Not on file   Highest education level: Not on file  Occupational History   Occupation: Research scientist (life sciences)    Comment: Retired   Occupation: [employer] course    Comment: retired also  Tobacco Use   Smoking status: Never   Smokeless tobacco: Never  Vaping Use   Vaping Use: Never used  Substance and Sexual Activity   Alcohol use: Not Currently    Alcohol/week: 0.0 standard drinks of alcohol   Drug use: No   Sexual activity: Not on file  Other Topics Concern   Not on file  Social History Narrative   Has living will   Lives with wife in [place]   Wife, then son [name], is his health care POA   Would accept resuscitation attempts but no prolonged ventilation   Would consider feeding tube   Occupation: retired, was Research scientist (life sciences)   Activity: bowling 3x/wk   Diet: good water, fruits/vegetables daily      [name] local son: [name] (and wife [name])- [phone]   [name] son in [place]: [name]- [phone]   Social Determinants of Health   Financial Resource Strain: [place]  (07/06/2021)   Overall Financial Resource Strain (CARDIA)    Difficulty of Paying Living Expenses: Not hard at all  Food Insecurity: No [place] (07/07/2022)   Hunger Vital Sign    Worried About Running Out of Food in the Last Year:  Never true    [place] in the Last Year: Never true  Transportation Needs: No Transportation Needs (07/07/2022)   PRAPARE - Hydrologist (Medical): No    Lack of Transportation (Non-Medical): No  Physical Activity: Insufficiently Active (07/06/2021)   Exercise Vital Sign    Days of Exercise per Week: 2 days    Minutes of Exercise per Session: 20 min  Stress: No Stress Concern Present (07/06/2021)   [place]    Feeling of Stress : Not at all  Social Connections: Unknown (07/06/2021)   Social Connection and Isolation Panel [NHANES]    Frequency of Communication with Friends and Family: Not on file    Frequency of Social Gatherings with Friends and Family: Not on file    Attends Religious Services: Not on file    Active Member of Clubs or Organizations: Not on file    Attends Archivist Meetings: Not on file    Marital Status: Married  Intimate Partner Violence: Not At Risk (07/07/2022)   Humiliation, Afraid, Rape, and Kick questionnaire    Fear of Current or Ex-Partner: No    Emotionally Abused: No    Physically Abused: No    Sexually Abused: No    Past Medical History, Surgical history, Social history, and Family history were reviewed and updated as appropriate.   Please see review of systems for further details on the patient's review from today.   Objective:   Physical Exam:  There were no vitals taken for this visit.  Physical Exam Constitutional:      General: He is not in acute distress.    Appearance: He is well-developed.  Musculoskeletal:        General: No deformity.  Neurological:     Mental Status: He is alert and oriented to person, place, and time.     Motor: Tremor present.     Coordination: Coordination normal.     Gait: Gait normal.  Psychiatric:  Attention and Perception: He is attentive. He does not perceive auditory hallucinations.         Mood and Affect: Mood is not anxious or depressed. Affect is not labile, blunt, angry or inappropriate.        Speech: Speech normal. Speech is not slurred.        Behavior: Behavior normal.        Thought Content: Thought content normal. Thought content is not delusional. Thought content does not include homicidal or suicidal ideation. Thought content does not include suicidal plan.        Cognition and Memory: Cognition and memory normal.     Comments: Insight and judgment fair.  Does not really understand bipolar.. no mania. No auditory or visual hallucinations. No delusions.  oriented     Lab Review:     Component Value Date/Time   NA 137 06/08/2022 1354   NA 144 03/25/2016 0901   NA 143 12/03/2014 1036   K 4.1 06/08/2022 1354   K 3.4 (L) 12/03/2014 1036   CL 104 06/08/2022 1354   CL 111 (H) 12/03/2014 1036   CO2 27 06/08/2022 1354   CO2 24 12/03/2014 1036   GLUCOSE 78 06/08/2022 1354   GLUCOSE 103 (H) 12/03/2014 1036   BUN 20 06/08/2022 1354   BUN 20 03/25/2016 0901   BUN 16 12/03/2014 1036   CREATININE 1.05 06/08/2022 1354   CREATININE 1.13 12/03/2014 1036   CALCIUM 9.0 06/08/2022 1354   CALCIUM 8.3 (L) 12/03/2014 1036   PROT 6.8 06/08/2022 1354   PROT 6.3 03/25/2016 0901   PROT 6.8 12/03/2014 1036   ALBUMIN 4.1 06/08/2022 1354   ALBUMIN 4.2 03/25/2016 0901   ALBUMIN 3.7 12/03/2014 1036   AST 16 06/08/2022 1354   AST 23 12/03/2014 1036   ALT 9 06/08/2022 1354   ALT 22 12/03/2014 1036   ALKPHOS 114 06/08/2022 1354   ALKPHOS 94 12/03/2014 1036   BILITOT 0.5 06/08/2022 1354   BILITOT 0.3 03/25/2016 0901   BILITOT 0.8 12/03/2014 1036   GFRNONAA 71 03/25/2016 0901   GFRNONAA >60 12/03/2014 1036   GFRAA 82 03/25/2016 0901   GFRAA >60 12/03/2014 1036       Component Value Date/Time   WBC 3.4 (L) 06/08/2022 1354   RBC 4.06 (L) 06/08/2022 1354   HGB 12.2 (L) 06/08/2022 1354   HGB 12.4 (L) 03/25/2016 0901   HCT 37.5 (L) 06/08/2022 1354   HCT 37.2 (L) 03/25/2016  0901   PLT 245.0 06/08/2022 1354   PLT 278 03/25/2016 0901   MCV 92.3 06/08/2022 1354   MCV 91 03/25/2016 0901   MCV 96 12/03/2014 1036   MCH 30.2 11/15/2019 0926   MCHC 32.7 06/08/2022 1354   RDW 14.1 06/08/2022 1354   RDW 13.5 03/25/2016 0901   RDW 13.5 12/03/2014 1036   LYMPHSABS 0.9 06/08/2022 1354   LYMPHSABS 0.9 03/25/2016 0901   MONOABS 0.4 06/08/2022 1354   EOSABS 0.1 06/08/2022 1354   EOSABS 0.1 03/25/2016 0901   BASOSABS 0.0 06/08/2022 1354   BASOSABS 0.0 03/25/2016 0901    No results found for: "POCLITH", "LITHIUM"   No results found for: "PHENYTOIN", "PHENOBARB", "VALPROATE", "CBMZ"   .res Assessment: Plan:    Bipolar I disorder ([place]) - Plan: QUEtiapine (SEROQUEL) 300 MG tablet, PARoxetine (PAXIL) 30 MG tablet  Insomnia due to mental condition - Plan: QUEtiapine (SEROQUEL) 300 MG tablet  Benign essential tremor   Greater than 50% of 30 min face to face time with  patient was spent on counseling and coordination of care. We discussed Bipolar managed per pt and his wife.   Disc risk SSRI causing cycling but he's having none. Recently he was confused but per his wife he was mixing up his medications.  She is not exactly sure what he was taking incorrectly but did discover he was taking some of the medications incorrectly.  She has corrected what he is taking and he is no longer having confusion.  He is somewhat fearful about reducing any of the medications but due to his age it would be ideal to try to reduce the quetiapine and paroxetine if possible.  Tolerating meds.  No change indicated.  Failed reducition in meds in the past.  Disc SE in detail and SSRI withdrawal sx.  Discussed potential metabolic side effects associated with atypical antipsychotics, as well as potential risk for movement side effects. Advised pt to contact office if movement side effects occur.  BC age trial reduction of dose quetiapine 600 mg HS and paroxetine 30 mg daily.  Discussed potential  metabolic side effects associated with atypical antipsychotics, as well as potential risk for movement side effects. Advised pt to contact office if movement side effects occur. Disc fall risk and confusion risk if doesn't take exactly as RX  Fall precautions.   FU 3-4 mos.  He's reluctant to come in this soon bc son has to bring him.  Disc importance of verifying stability after med changes  Lynder Parents, MD, DFAPA   Please see After Visit Summary for patient specific instructions.  Future Appointments  Date Time Provider [place]  02/08/2023  2:30 PM Ralene Bathe, MD [facility] None  06/08/2023 11:30 AM Ralene Bathe, MD [facility] None  07/10/2023 12:45 PM [place] ADVISOR [id number]    No orders of the defined types were placed in this encounter.     -------------------------------

## 2023-02-07 NOTE — Patient Instructions (Signed)
Stop paroxetine 40 mg and start paroxetine 30 mg daily. Stop quetiapine 400 mg size and start quetiapine 300 mg tablets, 2 at night.

## 2023-02-08 ENCOUNTER — Ambulatory Visit (INDEPENDENT_AMBULATORY_CARE_PROVIDER_SITE_OTHER): Payer: PPO | Admitting: Dermatology

## 2023-02-08 ENCOUNTER — Other Ambulatory Visit: Payer: Self-pay | Admitting: Psychiatry

## 2023-02-08 ENCOUNTER — Telehealth: Payer: Self-pay | Admitting: Psychiatry

## 2023-02-08 VITALS — BP 121/71 | HR 90

## 2023-02-08 DIAGNOSIS — G250 Essential tremor: Secondary | ICD-10-CM

## 2023-02-08 DIAGNOSIS — Z4802 Encounter for removal of sutures: Secondary | ICD-10-CM

## 2023-02-08 DIAGNOSIS — Z85828 Personal history of other malignant neoplasm of skin: Secondary | ICD-10-CM

## 2023-02-08 MED ORDER — PRIMIDONE 50 MG PO TABS: 150.0000 mg | ORAL_TABLET | Freq: Every day | ORAL | 3 refills | Status: DC

## 2023-02-08 NOTE — Progress Notes (Signed)
   Follow-Up Visit   Subjective  Rodney Branch is a 89 y.o. male who presents for the following: hx of BCC margins free (R post ear, recheck excision site).  Patient accompanied by wife.  The following portions of the chart were reviewed this encounter and updated as appropriate:   Tobacco  Allergies  Meds  Problems  Med Hx  Surg Hx  Fam Hx     Review of Systems:  No other skin or systemic complaints except as noted in HPI or Assessment and Plan.  Objective  Well appearing patient in no apparent distress; mood and affect are within normal limits.  A focused examination was performed including right posterior ear. Relevant physical exam findings are noted in the Assessment and Plan.  Right Ear posterior Healing excision site   Assessment & Plan  History of basal cell carcinoma (BCC) Right Ear posterior  Margins clear, bx proven Healing excision site  Wound cleaned with Puracyn, Mupirocin applied followed by bandage.  Cont wound care qd to qod   Return in about 3 weeks (around 03/01/2023) for recheck excision site R post ear.  I, [name], RMA, am acting as scribe for Sarina Ser, MD . Documentation: I have reviewed the above documentation for accuracy and completeness, and I agree with the above.  Sarina Ser, MD

## 2023-02-08 NOTE — Telephone Encounter (Signed)
She said she wanted to get started on the lower dose

## 2023-02-08 NOTE — Telephone Encounter (Signed)
[name] wife, Mardene Celeste, LVM @ 9:56a.  She is on the [facility].  She said pt had a visit yesterday.  She got the written scripts for 2 of the medicines, but not the script for the Primidone.  [name] wife says he wants to hand the script to the pharmacist and she asked if it could be mailed to them.  If not, she said it would be ok to send it to   [place], [name] - [place] [address] [name] [address] Phone: [phone]  Fax: [phone]   Next appt 02/07/24

## 2023-02-08 NOTE — Patient Instructions (Signed)
Due to recent changes in healthcare laws, you may see results of your pathology and/or laboratory studies on MyChart before the doctors have had a chance to review them. We understand that in some cases there may be results that are confusing or concerning to you. Please understand that not all results are received at the same time and often the doctors may need to interpret multiple results in order to provide you with the best plan of care or course of treatment. Therefore, we ask that you please give us 2 business days to thoroughly review all your results before contacting the office for clarification. Should we see a critical lab result, you will be contacted sooner.   If You Need Anything After Your Visit  If you have any questions or concerns for your doctor, please call our main line at [phone] and press option 4 to reach your doctor's medical assistant. If no one answers, please leave a voicemail as directed and we will return your call as soon as possible. Messages left after 4 pm will be answered the following business day.   You may also send us a message via MyChart. We typically respond to MyChart messages within 1-2 business days.  For prescription refills, please ask your pharmacy to contact our office. Our fax number is [phone].  If you have an urgent issue when the clinic is closed that cannot wait until the next business day, you can page your doctor at the number below.    Please note that while we do our best to be available for urgent issues outside of office hours, we are not available 24/7.   If you have an urgent issue and are unable to reach us, you may choose to seek medical care at your doctor's office, retail clinic, urgent care center, or emergency room.  If you have a medical emergency, please immediately call 911 or go to the emergency department.  Pager Numbers  - Dr. Kowalski: [phone]  - Dr. Moye: [phone]  - Dr. Stewart:  [phone]  In the event of inclement weather, please call our main line at [phone] for an update on the status of any delays or closures.  Dermatology Medication Tips: Please keep the boxes that topical medications come in in order to help keep track of the instructions about where and how to use these. Pharmacies typically print the medication instructions only on the boxes and not directly on the medication tubes.   If your medication is too expensive, please contact our office at [phone] option 4 or send us a message through MyChart.   We are unable to tell what your co-pay for medications will be in advance as this is different depending on your insurance coverage. However, we may be able to find a substitute medication at lower cost or fill out paperwork to get insurance to cover a needed medication.   If a prior authorization is required to get your medication covered by your insurance company, please allow us 1-2 business days to complete this process.  Drug prices often vary depending on where the prescription is filled and some pharmacies may offer cheaper prices.  The website www.goodrx.com contains coupons for medications through different pharmacies. The prices here do not account for what the cost may be with help from insurance (it may be cheaper with your insurance), but the website can give you the price if you did not use any insurance.  - You can print the associated coupon and take it with   your prescription to the pharmacy.  - You may also stop by our office during regular business hours and pick up a GoodRx coupon card.  - If you need your prescription sent electronically to a different pharmacy, notify our office through [facility] MyChart or by phone at [phone] option 4.     Si Usted Necesita Algo Despus de Su Visita  Tambin puede enviarnos un mensaje a travs de MyChart. Por lo general respondemos a los mensajes de MyChart en el transcurso de 1 a 2  das hbiles.  Para renovar recetas, por favor pida a su farmacia que se ponga en contacto con nuestra oficina. Nuestro nmero de fax es el [phone].  Si tiene un asunto urgente cuando la clnica est cerrada y que no puede esperar hasta el siguiente da hbil, puede llamar/localizar a su doctor(a) al nmero que aparece a continuacin.   Por favor, tenga en cuenta que aunque hacemos todo lo posible para estar disponibles para asuntos urgentes fuera del horario de oficina, no estamos disponibles las 24 horas del da, los 7 das de la semana.   Si tiene un problema urgente y no puede comunicarse con nosotros, puede optar por buscar atencin mdica  en el consultorio de su doctor(a), en una clnica privada, en un centro de atencin urgente o en una sala de emergencias.  Si tiene una emergencia mdica, por favor llame inmediatamente al 911 o vaya a la sala de emergencias.  Nmeros de bper  - Dr. Kowalski: [phone]  - Dra. Moye: [phone]  - Dra. Stewart: [phone]  En caso de inclemencias del tiempo, por favor llame a nuestra lnea principal al [phone] para una actualizacin sobre el estado de cualquier retraso o cierre.  Consejos para la medicacin en dermatologa: Por favor, guarde las cajas en las que vienen los medicamentos de uso tpico para ayudarle a seguir las instrucciones sobre dnde y cmo usarlos. Las farmacias generalmente imprimen las instrucciones del medicamento slo en las cajas y no directamente en los tubos del medicamento.   Si su medicamento es muy caro, por favor, pngase en contacto con nuestra oficina llamando al [phone] y presione la opcin 4 o envenos un mensaje a travs de MyChart.   No podemos decirle cul ser su copago por los medicamentos por adelantado ya que esto es diferente dependiendo de la cobertura de su seguro. Sin embargo, es posible que podamos encontrar un medicamento sustituto a menor costo o llenar un formulario para que el  seguro cubra el medicamento que se considera necesario.   Si se requiere una autorizacin previa para que su compaa de seguros cubra su medicamento, por favor permtanos de 1 a 2 das hbiles para completar este proceso.  Los precios de los medicamentos varan con frecuencia dependiendo del lugar de dnde se surte la receta y alguna farmacias pueden ofrecer precios ms baratos.  El sitio web www.goodrx.com tiene cupones para medicamentos de diferentes farmacias. Los precios aqu no tienen en cuenta lo que podra costar con la ayuda del seguro (puede ser ms barato con su seguro), pero el sitio web puede darle el precio si no utiliz ningn seguro.  - Puede imprimir el cupn correspondiente y llevarlo con su receta a la farmacia.  - Tambin puede pasar por nuestra oficina durante el horario de atencin regular y recoger una tarjeta de cupones de GoodRx.  - Si necesita que su receta se enve electrnicamente a una farmacia diferente, informe a nuestra oficina a travs de MyChart de [place]   o por telfono llamando al [phone] y presione la opcin 4.  

## 2023-02-08 NOTE — Telephone Encounter (Signed)
Printed.  Will mail to pt per request.

## 2023-02-08 NOTE — Telephone Encounter (Signed)
I printed this per his request

## 2023-02-10 ENCOUNTER — Encounter: Payer: Self-pay | Admitting: Dermatology

## 2023-03-02 ENCOUNTER — Ambulatory Visit: Payer: PPO | Admitting: Dermatology

## 2023-03-02 DIAGNOSIS — L98491 Non-pressure chronic ulcer of skin of other sites limited to breakdown of skin: Secondary | ICD-10-CM | POA: Diagnosis not present

## 2023-03-02 DIAGNOSIS — L578 Other skin changes due to chronic exposure to nonionizing radiation: Secondary | ICD-10-CM

## 2023-03-02 DIAGNOSIS — L570 Actinic keratosis: Secondary | ICD-10-CM | POA: Diagnosis not present

## 2023-03-02 DIAGNOSIS — L821 Other seborrheic keratosis: Secondary | ICD-10-CM | POA: Diagnosis not present

## 2023-03-02 DIAGNOSIS — Z85828 Personal history of other malignant neoplasm of skin: Secondary | ICD-10-CM | POA: Diagnosis not present

## 2023-03-02 NOTE — Progress Notes (Signed)
   Follow-Up Visit   Subjective  Rodney Branch is a 89 y.o. male who presents for the following: Recheck previously excised BCC site of the R post ear. The patient has spots, moles and lesions to be evaluated, some may be new or changing and the patient has concerns that these could be cancer.  The following portions of the chart were reviewed this encounter and updated as appropriate: medications, allergies, medical history  Review of Systems:  No other skin or systemic complaints except as noted in HPI or Assessment and Plan.  Objective  Well appearing patient in no apparent distress; mood and affect are within normal limits. A focused examination was performed of the following areas: the face, neck, post ear. Relevant exam findings are noted in the Assessment and Plan.   Assessment & Plan   ACTINIC DAMAGE - chronic, secondary to cumulative UV radiation exposure/sun exposure over time - diffuse scaly erythematous macules with underlying dyspigmentation - Recommend daily broad spectrum sunscreen SPF 30+ to sun-exposed areas, reapply every 2 hours as needed.  - Recommend staying in the shade or wearing long sleeves, sun glasses (UVA+UVB protection) and wide brim hats (4-inch brim around the entire circumference of the hat). - Call for new or changing lesions.  HISTORY OF BASAL CELL CARCINOMA OF THE SKIN - R post ear normal appearing crust, healing well.Mechanical debridement performed today. Keep wound covered with Vaseline until completely healed. Recheck at follow up.  - No evidence of recurrence today - Recommend regular full body skin exams - Recommend daily broad spectrum sunscreen SPF 30+ to sun-exposed areas, reapply every 2 hours as needed.  - Call if any new or changing lesions are noted between office visits  SEBORRHEIC KERATOSIS - Stuck-on, waxy, tan-brown papules and/or plaques  - Benign-appearing - Discussed benign etiology and prognosis. - Observe - Call for any  changes  ACTINIC KERATOSIS Exam: Erythematous thin papules/macules with gritty scale  Actinic keratoses are precancerous spots that appear secondary to cumulative UV radiation exposure/sun exposure over time. They are chronic with expected duration over 1 year. A portion of actinic keratoses will progress to squamous cell carcinoma of the skin. It is not possible to reliably predict which spots will progress to skin cancer and so treatment is recommended to prevent development of skin cancer.  Recommend daily broad spectrum sunscreen SPF 30+ to sun-exposed areas, reapply every 2 hours as needed.  Recommend staying in the shade or wearing long sleeves, sun glasses (UVA+UVB protection) and wide brim hats (4-inch brim around the entire circumference of the hat). Call for new or changing lesions.  Treatment Plan:  Prior to procedure, discussed risks of blister formation, small wound, skin dyspigmentation, or rare scar following cryotherapy. Recommend Vaseline ointment to treated areas while healing.  Destruction Procedure Note Destruction method: cryotherapy   Informed consent: discussed and consent obtained   Lesion destroyed using liquid nitrogen: Yes   Outcome: patient tolerated procedure well with no complications   Post-procedure details: wound care instructions given   Locations: L forehead x 2 # of Lesions Treated: 2  Excoriation on the R forehead - Benign, observe. Will recheck at follow up, may bx if not resolved.   Return for appointment as scheduled.  Maylene Roes, CMA, am acting as scribe for Armida Sans, MD .  Documentation: I have reviewed the above documentation for accuracy and completeness, and I agree with the above.  Armida Sans, MD

## 2023-03-02 NOTE — Patient Instructions (Signed)
Due to recent changes in healthcare laws, you may see results of your pathology and/or laboratory studies on MyChart before the doctors have had a chance to review them. We understand that in some cases there may be results that are confusing or concerning to you. Please understand that not all results are received at the same time and often the doctors may need to interpret multiple results in order to provide you with the best plan of care or course of treatment. Therefore, we ask that you please give us 2 business days to thoroughly review all your results before contacting the office for clarification. Should we see a critical lab result, you will be contacted sooner.   If You Need Anything After Your Visit  If you have any questions or concerns for your doctor, please call our main line at [phone] and press option 4 to reach your doctor's medical assistant. If no one answers, please leave a voicemail as directed and we will return your call as soon as possible. Messages left after 4 pm will be answered the following business day.   You may also send us a message via MyChart. We typically respond to MyChart messages within 1-2 business days.  For prescription refills, please ask your pharmacy to contact our office. Our fax number is [phone].  If you have an urgent issue when the clinic is closed that cannot wait until the next business day, you can page your doctor at the number below.    Please note that while we do our best to be available for urgent issues outside of office hours, we are not available 24/7.   If you have an urgent issue and are unable to reach us, you may choose to seek medical care at your doctor's office, retail clinic, urgent care center, or emergency room.  If you have a medical emergency, please immediately call 911 or go to the emergency department.  Pager Numbers  - Dr. Kowalski: [phone]  - Dr. Moye: [phone]  - Dr. Stewart:  [phone]  In the event of inclement weather, please call our main line at [phone] for an update on the status of any delays or closures.  Dermatology Medication Tips: Please keep the boxes that topical medications come in in order to help keep track of the instructions about where and how to use these. Pharmacies typically print the medication instructions only on the boxes and not directly on the medication tubes.   If your medication is too expensive, please contact our office at [phone] option 4 or send us a message through MyChart.   We are unable to tell what your co-pay for medications will be in advance as this is different depending on your insurance coverage. However, we may be able to find a substitute medication at lower cost or fill out paperwork to get insurance to cover a needed medication.   If a prior authorization is required to get your medication covered by your insurance company, please allow us 1-2 business days to complete this process.  Drug prices often vary depending on where the prescription is filled and some pharmacies may offer cheaper prices.  The website www.goodrx.com contains coupons for medications through different pharmacies. The prices here do not account for what the cost may be with help from insurance (it may be cheaper with your insurance), but the website can give you the price if you did not use any insurance.  - You can print the associated coupon and take it with   your prescription to the pharmacy.  - You may also stop by our office during regular business hours and pick up a GoodRx coupon card.  - If you need your prescription sent electronically to a different pharmacy, notify our office through [facility] MyChart or by phone at [phone] option 4.     Si Usted Necesita Algo Despus de Su Visita  Tambin puede enviarnos un mensaje a travs de MyChart. Por lo general respondemos a los mensajes de MyChart en el transcurso de 1 a 2  das hbiles.  Para renovar recetas, por favor pida a su farmacia que se ponga en contacto con nuestra oficina. Nuestro nmero de fax es el [phone].  Si tiene un asunto urgente cuando la clnica est cerrada y que no puede esperar hasta el siguiente da hbil, puede llamar/localizar a su doctor(a) al nmero que aparece a continuacin.   Por favor, tenga en cuenta que aunque hacemos todo lo posible para estar disponibles para asuntos urgentes fuera del horario de oficina, no estamos disponibles las 24 horas del da, los 7 das de la semana.   Si tiene un problema urgente y no puede comunicarse con nosotros, puede optar por buscar atencin mdica  en el consultorio de su doctor(a), en una clnica privada, en un centro de atencin urgente o en una sala de emergencias.  Si tiene una emergencia mdica, por favor llame inmediatamente al 911 o vaya a la sala de emergencias.  Nmeros de bper  - Dr. Kowalski: [phone]  - Dra. Moye: [phone]  - Dra. Stewart: [phone]  En caso de inclemencias del tiempo, por favor llame a nuestra lnea principal al [phone] para una actualizacin sobre el estado de cualquier retraso o cierre.  Consejos para la medicacin en dermatologa: Por favor, guarde las cajas en las que vienen los medicamentos de uso tpico para ayudarle a seguir las instrucciones sobre dnde y cmo usarlos. Las farmacias generalmente imprimen las instrucciones del medicamento slo en las cajas y no directamente en los tubos del medicamento.   Si su medicamento es muy caro, por favor, pngase en contacto con nuestra oficina llamando al [phone] y presione la opcin 4 o envenos un mensaje a travs de MyChart.   No podemos decirle cul ser su copago por los medicamentos por adelantado ya que esto es diferente dependiendo de la cobertura de su seguro. Sin embargo, es posible que podamos encontrar un medicamento sustituto a menor costo o llenar un formulario para que el  seguro cubra el medicamento que se considera necesario.   Si se requiere una autorizacin previa para que su compaa de seguros cubra su medicamento, por favor permtanos de 1 a 2 das hbiles para completar este proceso.  Los precios de los medicamentos varan con frecuencia dependiendo del lugar de dnde se surte la receta y alguna farmacias pueden ofrecer precios ms baratos.  El sitio web www.goodrx.com tiene cupones para medicamentos de diferentes farmacias. Los precios aqu no tienen en cuenta lo que podra costar con la ayuda del seguro (puede ser ms barato con su seguro), pero el sitio web puede darle el precio si no utiliz ningn seguro.  - Puede imprimir el cupn correspondiente y llevarlo con su receta a la farmacia.  - Tambin puede pasar por nuestra oficina durante el horario de atencin regular y recoger una tarjeta de cupones de GoodRx.  - Si necesita que su receta se enve electrnicamente a una farmacia diferente, informe a nuestra oficina a travs de MyChart de [place]   o por telfono llamando al [phone] y presione la opcin 4.  

## 2023-03-04 ENCOUNTER — Encounter: Payer: Self-pay | Admitting: Dermatology

## 2023-05-10 ENCOUNTER — Telehealth: Payer: Self-pay | Admitting: Psychiatry

## 2023-05-10 DIAGNOSIS — F319 Bipolar disorder, unspecified: Secondary | ICD-10-CM

## 2023-05-10 DIAGNOSIS — F5105 Insomnia due to other mental disorder: Secondary | ICD-10-CM

## 2023-05-10 MED ORDER — QUETIAPINE FUMARATE 300 MG PO TABS: 600.0000 mg | ORAL_TABLET | Freq: Every day | ORAL | 0 refills | Status: DC

## 2023-05-10 NOTE — Telephone Encounter (Signed)
Pt requesting Rx for Quetiapine 300 mg 2/d #180. [facility]  [address] [place].

## 2023-05-10 NOTE — Telephone Encounter (Signed)
Sent!

## 2023-06-07 DIAGNOSIS — H903 Sensorineural hearing loss, bilateral: Secondary | ICD-10-CM | POA: Diagnosis not present

## 2023-06-07 DIAGNOSIS — H6123 Impacted cerumen, bilateral: Secondary | ICD-10-CM | POA: Diagnosis not present

## 2023-06-08 ENCOUNTER — Ambulatory Visit: Payer: PPO | Admitting: Dermatology

## 2023-06-08 ENCOUNTER — Encounter: Payer: Self-pay | Admitting: Dermatology

## 2023-06-08 DIAGNOSIS — Z86018 Personal history of other benign neoplasm: Secondary | ICD-10-CM

## 2023-06-08 DIAGNOSIS — Z85820 Personal history of malignant melanoma of skin: Secondary | ICD-10-CM | POA: Diagnosis not present

## 2023-06-08 DIAGNOSIS — Z85828 Personal history of other malignant neoplasm of skin: Secondary | ICD-10-CM | POA: Diagnosis not present

## 2023-06-08 DIAGNOSIS — Z1283 Encounter for screening for malignant neoplasm of skin: Secondary | ICD-10-CM | POA: Diagnosis not present

## 2023-06-08 DIAGNOSIS — D492 Neoplasm of unspecified behavior of bone, soft tissue, and skin: Secondary | ICD-10-CM

## 2023-06-08 DIAGNOSIS — W908XXA Exposure to other nonionizing radiation, initial encounter: Secondary | ICD-10-CM | POA: Diagnosis not present

## 2023-06-08 DIAGNOSIS — D1801 Hemangioma of skin and subcutaneous tissue: Secondary | ICD-10-CM

## 2023-06-08 DIAGNOSIS — L578 Other skin changes due to chronic exposure to nonionizing radiation: Secondary | ICD-10-CM | POA: Diagnosis not present

## 2023-06-08 DIAGNOSIS — L821 Other seborrheic keratosis: Secondary | ICD-10-CM | POA: Diagnosis not present

## 2023-06-08 DIAGNOSIS — L814 Other melanin hyperpigmentation: Secondary | ICD-10-CM

## 2023-06-08 DIAGNOSIS — D229 Melanocytic nevi, unspecified: Secondary | ICD-10-CM

## 2023-06-08 NOTE — Patient Instructions (Signed)
Recommend daily broad spectrum sunscreen SPF 30+ to sun-exposed areas, reapply every 2 hours as needed. Call for new or changing lesions.  Staying in the shade or wearing long sleeves, sun glasses (UVA+UVB protection) and wide brim hats (4-inch brim around the entire circumference of the hat) are also recommended for sun protection.      Pre-Operative Instructions  You are scheduled for a surgical procedure at [facility]. We recommend you read the following instructions. If you have any questions or concerns, please call the office at [phone].  Shower and wash the entire body with soap and water the day of your surgery paying special attention to cleansing at and around the planned surgery site.  Avoid aspirin or aspirin containing products at least fourteen (14) days prior to your surgical procedure and for at least one week (7 Days) after your surgical procedure. If you take aspirin on a regular basis for heart disease or history of stroke or for any other reason, we may recommend you continue taking aspirin but please notify us if you take this on a regular basis. Aspirin can cause more bleeding to occur during surgery as well as prolonged bleeding and bruising after surgery.   Avoid other nonsteroidal pain medications at least one week prior to surgery and at least one week prior to your surgery. These include medications such as Ibuprofen (Motrin, Advil and Nuprin), Naprosyn, Voltaren, Relafen, etc. If medications are used for therapeutic reasons, please inform us as they can cause increased bleeding or prolonged bleeding during and bruising after surgical procedures.   Please advise [place] if you are taking any "blood thinner" medications such as Coumadin or Dipyridamole or Plavix or similar medications. These cause increased bleeding and prolonged bleeding during procedures and bruising after surgical procedures. We may have to consider discontinuing these medications briefly prior  to and shortly after your surgery if safe to do so.   Please inform us of all medications you are currently taking. All medications that are taken regularly should be taken the day of surgery as you always do. Nevertheless, we need to be informed of what medications you are taking prior to surgery to know whether they will affect the procedure or cause any complications.   Please inform us of any medication allergies. Also inform us of whether you have allergies to Latex or rubber products or whether you have had any adverse reaction to Lidocaine or Epinephrine.  Please inform us of any prosthetic or artificial body parts such as artificial heart valve, joint replacements, etc., or similar condition that might require preoperative antibiotics.   We recommend avoidance of alcohol at least two weeks prior to surgery and continued avoidance for at least two weeks after surgery.   We recommend discontinuation of tobacco smoking at least two weeks prior to surgery and continued abstinence for at least two weeks after surgery.  Do not plan strenuous exercise, strenuous work or strenuous lifting for approximately four weeks after your surgery.   We request if you are unable to make your scheduled surgical appointment, please call us at least a week in advance or as soon as you are aware of a problem so that we can cancel or reschedule the appointment.   You MAY TAKE TYLENOL (acetaminophen) for pain as it is not a blood thinner.   PLEASE PLAN TO BE IN TOWN FOR TWO WEEKS FOLLOWING SURGERY, THIS IS IMPORTANT SO YOU CAN BE CHECKED FOR DRESSING CHANGES, SUTURE REMOVAL AND TO MONITOR FOR  POSSIBLE COMPLICATIONS.   Melanoma ABCDEs  Melanoma is the most dangerous type of skin cancer, and is the leading cause of death from skin disease.  You are more likely to develop melanoma if you: Have light-colored skin, light-colored eyes, or red or blond hair Spend a lot of time in the sun Tan regularly, either  outdoors or in a tanning bed Have had blistering sunburns, especially during childhood Have a close family member who has had a melanoma Have atypical moles or large birthmarks  Early detection of melanoma is key since treatment is typically straightforward and cure rates are extremely high if we catch it early.   The first sign of melanoma is often a change in a mole or a new dark spot.  The ABCDE system is a way of remembering the signs of melanoma.  A for asymmetry:  The two halves do not match. B for border:  The edges of the growth are irregular. C for color:  A mixture of colors are present instead of an even brown color. D for diameter:  Melanomas are usually (but not always) greater than 6mm - the size of a pencil eraser. E for evolution:  The spot keeps changing in size, shape, and color.  Please check your skin once per month between visits. You can use a small mirror in front and a large mirror behind you to keep an eye on the back side or your body.   If you see any new or changing lesions before your next follow-up, please call to schedule a visit.  Please continue daily skin protection including broad spectrum sunscreen SPF 30+ to sun-exposed areas, reapplying every 2 hours as needed when you're outdoors.   Staying in the shade or wearing long sleeves, sun glasses (UVA+UVB protection) and wide brim hats (4-inch brim around the entire circumference of the hat) are also recommended for sun protection.    Due to recent changes in healthcare laws, you may see results of your pathology and/or laboratory studies on MyChart before the doctors have had a chance to review them. We understand that in some cases there may be results that are confusing or concerning to you. Please understand that not all results are received at the same time and often the doctors may need to interpret multiple results in order to provide you with the best plan of care or course of treatment. Therefore, we  ask that you please give [place] 2 business days to thoroughly review all your results before contacting the office for clarification. Should we see a critical lab result, you will be contacted sooner.   If You Need Anything After Your Visit  If you have any questions or concerns for your doctor, please call our main line at [phone] and press option 4 to reach your doctor's medical assistant. If no one answers, please leave a voicemail as directed and we will return your call as soon as possible. Messages left after 4 pm will be answered the following business day.   You may also send [place] a message via MyChart. We typically respond to MyChart messages within 1-2 business days.  For prescription refills, please ask your pharmacy to contact our office. Our fax number is [phone].  If you have an urgent issue when the clinic is closed that cannot wait until the next business day, you can page your doctor at the number below.    Please note that while we do our best to be available for urgent issues  outside of office hours, we are not available 24/7.   If you have an urgent issue and are unable to reach [place], you may choose to seek medical care at your doctor's office, retail clinic, urgent care center, or emergency room.  If you have a medical emergency, please immediately call 911 or go to the emergency department.  Pager Numbers  - Dr. Gwen Pounds: [phone]  - Dr. Neale Burly: [phone]  - Dr. Roseanne Reno: [phone]  In the event of inclement weather, please call our main line at [phone] for an update on the status of any delays or closures.  Dermatology Medication Tips: Please keep the boxes that topical medications come in in order to help keep track of the instructions about where and how to use these. Pharmacies typically print the medication instructions only on the boxes and not directly on the medication tubes.   If your medication is too expensive, please contact our office  at [phone] option 4 or send [place] a message through MyChart.   We are unable to tell what your co-pay for medications will be in advance as this is different depending on your insurance coverage. However, we may be able to find a substitute medication at lower cost or fill out paperwork to get insurance to cover a needed medication.   If a prior authorization is required to get your medication covered by your insurance company, please allow [place] 1-2 business days to complete this process.  Drug prices often vary depending on where the prescription is filled and some pharmacies may offer cheaper prices.  The website www.goodrx.com contains coupons for medications through different pharmacies. The prices here do not account for what the cost may be with help from insurance (it may be cheaper with your insurance), but the website can give you the price if you did not use any insurance.  - You can print the associated coupon and take it with your prescription to the pharmacy.  - You may also stop by our office during regular business hours and pick up a GoodRx coupon card.  - If you need your prescription sent electronically to a different pharmacy, notify our office through [facility] or by phone at [phone] option 4.     Si Usted Necesita Algo Despus de Su Visita  Tambin puede enviarnos un mensaje a travs de Clinical cytogeneticist. Por lo general respondemos a los mensajes de MyChart en el transcurso de 1 a 2 das hbiles.  Para renovar recetas, por favor pida a su farmacia que se ponga en contacto con nuestra oficina. Annie Sable de fax es [place] [phone].  Si tiene un asunto urgente cuando la clnica est cerrada y que no puede esperar hasta el siguiente da hbil, puede llamar/localizar a su doctor(a) al nmero que aparece a continuacin.   Por favor, tenga en cuenta que aunque hacemos todo lo posible para estar disponibles para asuntos urgentes fuera del horario de [place], no estamos  disponibles las 24 horas del da, los 7 [address]  [address] de la [place].   Si tiene un problema urgente y no puede comunicarse con nosotros, puede optar por buscar atencin mdica  en el consultorio de su doctor(a), en una clnica privada, en un centro de atencin urgente o en una sala de emergencias.  Si tiene Engineer, drilling, por favor llame inmediatamente al 911 o vaya a la sala de emergencias.  Nmeros de bper  - Dr. Gwen Pounds: [phone]  - Dra. Moye: [phone]  - Dra. Roseanne Reno: [phone]  Angelia Mould  de inclemencias del tiempo, por favor llame a nuestra lnea principal al [phone] para una actualizacin sobre el [place] de cualquier retraso o cierre.  Consejos para la medicacin en dermatologa: Por favor, guarde las cajas en las que vienen los medicamentos de uso tpico para ayudarle a seguir las instrucciones sobre dnde y cmo usarlos. Las farmacias generalmente imprimen las instrucciones del medicamento slo en las cajas y no directamente en los tubos del [place].   Si su medicamento es muy caro, por favor, pngase en contacto con Rolm Gala llamando al [phone] y presione la opcin 4 o envenos un mensaje a travs de Clinical cytogeneticist.   No podemos decirle cul ser su copago por los medicamentos por adelantado ya que esto es diferente dependiendo de la cobertura de su seguro. Sin embargo, es posible que podamos encontrar un medicamento sustituto a Audiological scientist un formulario para que el seguro cubra el medicamento que se considera necesario.   Si se requiere una autorizacin previa para que su compaa de seguros [place] su medicamento, por favor permtanos de 1 a 2 das hbiles para completar [address]  Los precios de los medicamentos varan con frecuencia dependiendo del Environmental consultant de dnde se surte la receta y alguna farmacias pueden ofrecer precios ms baratos.  El sitio web www.goodrx.com tiene cupones para medicamentos de Health and safety inspector. Los precios aqu no  tienen en cuenta lo que podra costar con la ayuda del seguro (puede ser ms barato con su seguro), pero el sitio web puede darle el precio si no utiliz Tourist information centre manager.  - Puede imprimir el cupn correspondiente y llevarlo con su receta a la farmacia.  - Tambin puede pasar por nuestra oficina durante el horario de atencin regular y Education officer, museum una tarjeta de cupones de GoodRx.  - Si necesita que su receta se enve electrnicamente a una farmacia diferente, informe a nuestra oficina a travs de MyChart de [place] o por telfono llamando al [phone] y presione la opcin 4.

## 2023-06-08 NOTE — Progress Notes (Signed)
Follow-Up Visit   Subjective  Rodney Branch is a 89 y.o. male who presents for the following: Skin Cancer Screening and Full Body Skin Exam. HxMM, HxBCC, HxDN. C/O blister on toe. Wife tried to pop blister but only blood came out.   The patient presents for Total-Body Skin Exam (TBSE) for skin cancer screening and mole check. The patient has spots, moles and lesions to be evaluated, some may be new or changing and the patient may have concern these could be cancer.  Wife is with patient and contributes to history.   The following portions of the chart were reviewed this encounter and updated as appropriate: medications, allergies, medical history  Review of Systems:  No other skin or systemic complaints except as noted in HPI or Assessment and Plan.  Objective  Well appearing patient in no apparent distress; mood and affect are within normal limits.  A full examination was performed including scalp, head, eyes, ears, nose, lips, neck, chest, axillae, abdomen, back, buttocks, bilateral upper extremities, bilateral lower extremities, hands, feet, fingers, toes, fingernails, and toenails. All findings within normal limits unless otherwise noted below.   Relevant physical exam findings are noted in the Assessment and Plan.  Left great toe plantar surface 1.1 cm rubbery papule         Assessment & Plan   HISTORY OF MELANOMA. R abdomen, 2015. Right upper back 09/01/2021 - No evidence of recurrence today - No lymphadenopathy - Recommend regular full body skin exams - Recommend daily broad spectrum sunscreen SPF 30+ to sun-exposed areas, reapply every 2 hours as needed.  - Call if any new or changing lesions are noted between office visits   HISTORY OF BASAL CELL CARCINOMA OF THE SKIN. Right ear posterior, 01/31/2023 excised. - No evidence of recurrence today - Recommend regular full body skin exams - Recommend daily broad spectrum sunscreen SPF 30+ to sun-exposed areas, reapply  every 2 hours as needed.  - Call if any new or changing lesions are noted between office visits  HISTORY OF DYSPLASTIC NEVI. Multiple sites, see history.  No evidence of recurrence today Recommend regular full body skin exams Recommend daily broad spectrum sunscreen SPF 30+ to sun-exposed areas, reapply every 2 hours as needed.  Call if any new or changing lesions are noted between office visits   SKIN CANCER SCREENING PERFORMED TODAY.  ACTINIC DAMAGE - Chronic condition, secondary to cumulative UV/sun exposure - diffuse scaly erythematous macules with underlying dyspigmentation - Recommend daily broad spectrum sunscreen SPF 30+ to sun-exposed areas, reapply every 2 hours as needed.  - Staying in the shade or wearing long sleeves, sun glasses (UVA+UVB protection) and wide brim hats (4-inch brim around the entire circumference of the hat) are also recommended for sun protection.  - Call for new or changing lesions.  LENTIGINES, SEBORRHEIC KERATOSES, HEMANGIOMAS - Benign normal skin lesions - Benign-appearing - Call for any changes  MELANOCYTIC NEVI - Tan-brown and/or pink-flesh-colored symmetric macules and papules - Benign appearing on exam today - Observation - Call clinic for new or changing moles - Recommend daily use of broad spectrum spf 30+ sunscreen to sun-exposed areas.       Neoplasm of skin Left great toe plantar surface  Cyst vs other  Will schedule surgery time for removal.    Return in about 6 months (around 12/09/2023) for TBSE, HxMM/BCC/DN; , Cyst Excision, Next Available.  ILawson Radar, CMA, am acting as scribe for Armida Sans, MD.   Documentation: I have  reviewed the above documentation for accuracy and completeness, and I agree with the above.  Armida Sans, MD

## 2023-06-09 ENCOUNTER — Encounter: Payer: Self-pay | Admitting: Dermatology

## 2023-06-29 HISTORY — PX: TOE SURGERY: SHX1073

## 2023-07-04 ENCOUNTER — Ambulatory Visit: Payer: PPO | Admitting: Dermatology

## 2023-07-04 VITALS — BP 136/78

## 2023-07-04 DIAGNOSIS — D485 Neoplasm of uncertain behavior of skin: Secondary | ICD-10-CM

## 2023-07-04 DIAGNOSIS — D1801 Hemangioma of skin and subcutaneous tissue: Secondary | ICD-10-CM | POA: Diagnosis not present

## 2023-07-04 DIAGNOSIS — D1809 Hemangioma of other sites: Secondary | ICD-10-CM | POA: Diagnosis not present

## 2023-07-04 NOTE — Patient Instructions (Signed)
Wound Care Instructions  On the day following your surgery, you should begin doing daily dressing changes: Remove the old dressing and discard it. Cleanse the wound gently with tap water. This may be done in the shower or by placing a wet gauze pad directly on the wound and letting it soak for several minutes. It is important to gently remove any dried blood from the wound in order to encourage healing. This may be done by gently rolling a moistened Q-tip on the dried blood. Do not pick at the wound. If the wound should start to bleed, continue cleaning the wound, then place a moist gauze pad on the wound and hold pressure for a few minutes.  Make sure you then dry the skin surrounding the wound completely or the tape will not stick to the skin. Do not use cotton balls on the wound. After the wound is clean and dry, apply the ointment gently with a Q-tip. Cut a non-stick pad to fit the size of the wound. Lay the pad flush to the wound. If the wound is draining, you may want to reinforce it with a small amount of gauze on top of the non-stick pad for a little added compression to the area. Use the tape to seal the area completely. Select from the following with respect to your individual situation: If your wound has been stitched closed: continue the above steps 1-8 at least daily until your sutures are removed. If your wound has been left open to heal: continue steps 1-8 at least daily for the first 3-4 weeks. We would like for you to take a few extra precautions for at least the next week. Sleep with your head elevated on pillows if our wound is on your head. Do not bend over or lift heavy items to reduce the chance of elevated blood pressure to the wound Do not participate in particularly strenuous activities.   Below is a list of dressing supplies you might need.  Cotton-tipped applicators - Q-tips Gauze pads (2x2 and/or 4x4) - All-Purpose Sponges Non-stick dressing material - Telfa Tape -  Paper or Hypafix New and clean tube of petroleum jelly - Vaseline    Comments on Post-Operative Period Slight swelling and redness often appear around the wound. This is normal and will disappear within several days following the surgery. The healing wound will drain a brownish-red-yellow discharge during healing. This is a normal phase of wound healing. As the wound begins to heal, the drainage may increase in amount. Again, this drainage is normal. Notify us if the drainage becomes persistently bloody, excessively swollen, or intensely painful or develops a foul odor or red streaks.  If you should experience mild discomfort during the healing phase, you may take an aspirin-free medication such as Tylenol (acetaminophen). Notify us if the discomfort is severe or persistent. Avoid alcoholic beverages when taking pain medicine.  In Case of Wound Hemorrhage A wound hemorrhage is when the bandage suddenly becomes soaked with bright red blood and flows profusely. If this happens, sit down or lie down with your head elevated. If the wound has a dressing on it, do not remove the dressing. Apply pressure to the existing gauze. If the wound is not covered, use a gauze pad to apply pressure and continue applying the pressure for 20 minutes without peeking. DO NOT COVER THE WOUND WITH A LARGE TOWEL OR WASH CLOTH. Release your hand from the wound site but do not remove the dressing. If the bleeding has stopped,  gently clean around the wound. Leave the dressing in place for 24 hours if possible. This wait time allows the blood vessels to close off so that you do not spark a new round of bleeding by disrupting the newly clotted blood vessels with an immediate dressing change. If the bleeding does not subside, continue to hold pressure. If matters are out of your control, contact an After Hours clinic or go to the Emergency Room.    Due to recent changes in healthcare laws, you may see results of your pathology  and/or laboratory studies on MyChart before the doctors have had a chance to review them. We understand that in some cases there may be results that are confusing or concerning to you. Please understand that not all results are received at the same time and often the doctors may need to interpret multiple results in order to provide you with the best plan of care or course of treatment. Therefore, we ask that you please give [place] 2 business days to thoroughly review all your results before contacting the office for clarification. Should we see a critical lab result, you will be contacted sooner.   If You Need Anything After Your Visit  If you have any questions or concerns for your doctor, please call our main line at [phone] and press option 4 to reach your doctor's medical assistant. If no one answers, please leave a voicemail as directed and we will return your call as soon as possible. Messages left after 4 pm will be answered the following business day.   You may also send [place] a message via MyChart. We typically respond to MyChart messages within 1-2 business days.  For prescription refills, please ask your pharmacy to contact our office. Our fax number is [phone].  If you have an urgent issue when the clinic is closed that cannot wait until the next business day, you can page your doctor at the number below.    Please note that while we do our best to be available for urgent issues outside of office hours, we are not available 24/7.   If you have an urgent issue and are unable to reach [place], you may choose to seek medical care at your doctor's office, retail clinic, urgent care center, or emergency room.  If you have a medical emergency, please immediately call 911 or go to the emergency department.  Pager Numbers  - Dr. Gwen Pounds: [phone]  - Dr. Roseanne Reno: [phone]  In the event of inclement weather, please call our main line at [phone] for an update on the status of  any delays or closures.  Dermatology Medication Tips: Please keep the boxes that topical medications come in in order to help keep track of the instructions about where and how to use these. Pharmacies typically print the medication instructions only on the boxes and not directly on the medication tubes.   If your medication is too expensive, please contact our office at [phone] option 4 or send [place] a message through MyChart.   We are unable to tell what your co-pay for medications will be in advance as this is different depending on your insurance coverage. However, we may be able to find a substitute medication at lower cost or fill out paperwork to get insurance to cover a needed medication.   If a prior authorization is required to get your medication covered by your insurance company, please allow [place] 1-2 business days to complete this process.  Drug prices often vary  depending on where the prescription is filled and some pharmacies may offer cheaper prices.  The website www.goodrx.com contains coupons for medications through different pharmacies. The prices here do not account for what the cost may be with help from insurance (it may be cheaper with your insurance), but the website can give you the price if you did not use any insurance.  - You can print the associated coupon and take it with your prescription to the pharmacy.  - You may also stop by our office during regular business hours and pick up a GoodRx coupon card.  - If you need your prescription sent electronically to a different pharmacy, notify our office through [facility] or by phone at [phone] option 4.     Si Usted Necesita Algo Despus de Su Visita  Tambin puede enviarnos un mensaje a travs de Clinical cytogeneticist. Por lo general respondemos a los mensajes de MyChart en el transcurso de 1 a 2 das hbiles.  Para renovar recetas, por favor pida a su farmacia que se ponga en contacto con nuestra oficina. Annie Sable de fax es [place] [phone].  Si tiene un asunto urgente cuando la clnica est cerrada y que no puede esperar hasta el siguiente da hbil, puede llamar/localizar a su doctor(a) al nmero que aparece a continuacin.   Por favor, tenga en cuenta que aunque hacemos todo lo posible para estar disponibles para asuntos urgentes fuera del horario de [employer], no estamos disponibles las 24 horas del da, los 7 [address]  [address] de la [place].   Si tiene un problema urgente y no puede comunicarse con nosotros, puede optar por buscar atencin mdica  en el consultorio de su doctor(a), en una clnica privada, en un centro de atencin urgente o en una sala de emergencias.  Si tiene Engineer, drilling, por favor llame inmediatamente al 911 o vaya a la sala de emergencias.  Nmeros de bper  - Dr. Gwen Pounds: [phone]  - Dra. Roseanne Reno: [phone]  En caso de inclemencias del [place], por favor llame a Lacy Duverney principal al [phone] para una actualizacin sobre el [place] de cualquier retraso o cierre.  Consejos para la medicacin en dermatologa: Por favor, guarde las cajas en las que vienen los medicamentos de uso tpico para ayudarle a seguir las instrucciones sobre dnde y cmo usarlos. Las farmacias generalmente imprimen las instrucciones del medicamento slo en las cajas y no directamente en los tubos del [place].   Si su medicamento es muy caro, por favor, pngase en contacto con Rolm [place] llamando al [phone] y presione la opcin 4 o envenos un mensaje a travs de Clinical cytogeneticist.   No podemos decirle cul ser su copago por los medicamentos por adelantado ya que esto es diferente dependiendo de la cobertura de su seguro. Sin embargo, es posible que podamos encontrar un medicamento sustituto a Audiological scientist un formulario para que el seguro cubra el medicamento que se considera necesario.   Si se requiere una autorizacin previa para que su compaa de seguros [place] su  medicamento, por favor permtanos de 1 a 2 das hbiles para completar [address]  Los precios de los medicamentos varan con frecuencia dependiendo del Environmental consultant de dnde se surte la receta y alguna farmacias pueden ofrecer precios ms baratos.  El sitio web www.goodrx.com tiene cupones para medicamentos de Health and safety inspector. Los precios aqu no tienen en cuenta lo que podra costar con la ayuda del seguro (puede ser ms barato con su seguro), pero el sitio web puede  darle el precio si no utiliz [employer].  - Puede imprimir el cupn correspondiente y llevarlo con su receta a la farmacia.  - Tambin puede pasar por nuestra oficina durante el horario de atencin regular y Education officer, museum una tarjeta de cupones de GoodRx.  - Si necesita que su receta se enve electrnicamente a una farmacia diferente, informe a nuestra oficina a travs de MyChart de [place] o por telfono llamando al [phone] y presione la opcin 4.

## 2023-07-10 ENCOUNTER — Ambulatory Visit (INDEPENDENT_AMBULATORY_CARE_PROVIDER_SITE_OTHER): Payer: PPO | Admitting: *Deleted

## 2023-07-10 VITALS — Ht 67.0 in | Wt 143.0 lb

## 2023-07-10 DIAGNOSIS — Z0000 Encounter for general adult medical examination without abnormal findings: Secondary | ICD-10-CM | POA: Diagnosis not present
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
## 2023-07-10 NOTE — Patient Instructions (Signed)
Rodney Branch , Thank you for taking time to come for your Medicare Wellness Visit. I appreciate your ongoing commitment to your health goals. Please review the following plan we discussed and let me know if I can assist you in the future.   Referrals/Orders/Follow-Ups/Clinician Recommendations: None  This is a list of the screening recommended for you and due dates:  Health Maintenance  Topic Date Due   Zoster (Shingles) Vaccine (1 of 2) 04/21/1950   COVID-19 Vaccine (3 - Pfizer risk series) 03/09/2020   Flu Shot  06/29/2023   Medicare Annual Wellness Visit  07/09/2024   DTaP/Tdap/Td vaccine (4 - Td or Tdap) 08/29/2028   Pneumonia Vaccine  Completed   HPV Vaccine  Aged Out    Advanced directives: (Declined) Advance directive discussed with you today. Even though you declined this today, please call our office should you change your mind, and we can give you the proper paperwork for you to fill out.  Next Medicare Annual Wellness Visit scheduled for next year: Yes 07/10/24 @ 12:45  Preventive Care 65 Years and Older, Male  Preventive care refers to lifestyle choices and visits with your health care provider that can promote health and wellness. What does preventive care include? A yearly physical exam. This is also called an annual well check. Dental exams once or twice a year. Routine eye exams. Ask your health care provider how often you should have your eyes checked. Personal lifestyle choices, including: Daily care of your teeth and gums. Regular physical activity. Eating a healthy diet. Avoiding tobacco and drug use. Limiting alcohol use. Practicing safe sex. Taking low doses of aspirin every day. Taking vitamin and mineral supplements as recommended by your health care provider. What happens during an annual well check? The services and screenings done by your health care provider during your annual well check will depend on your age, overall health, lifestyle risk factors,  and family history of disease. Counseling  Your health care provider may ask you questions about your: Alcohol use. Tobacco use. Drug use. Emotional well-being. Home and relationship well-being. Sexual activity. Eating habits. History of falls. Memory and ability to understand (cognition). Work and work Astronomer. Screening  You may have the following tests or measurements: Height, weight, and BMI. Blood pressure. Lipid and cholesterol levels. These may be checked every 5 years, or more frequently if you are over 58 years old. Skin check. Lung cancer screening. You may have this screening every year starting at age 73 if you have a 30-pack-year history of smoking and currently smoke or have quit within the past 15 years. Fecal occult blood test (FOBT) of the stool. You may have this test every year starting at age 31. Flexible sigmoidoscopy or colonoscopy. You may have a sigmoidoscopy every 5 years or a colonoscopy every 10 years starting at age 69. Prostate cancer screening. Recommendations will vary depending on your family history and other risks. Hepatitis C blood test. Hepatitis B blood test. Sexually transmitted disease (STD) testing. Diabetes screening. This is done by checking your blood sugar (glucose) after you have not eaten for a while (fasting). You may have this done every 1-3 years. Abdominal aortic aneurysm (AAA) screening. You may need this if you are a current or former smoker. Osteoporosis. You may be screened starting at age 39 if you are at high risk. Talk with your health care provider about your test results, treatment options, and if necessary, the need for more tests. Vaccines  Your health care provider  may recommend certain vaccines, such as: Influenza vaccine. This is recommended every year. Tetanus, diphtheria, and acellular pertussis (Tdap, Td) vaccine. You may need a Td booster every 10 years. Zoster vaccine. You may need this after age  27. Pneumococcal 13-valent conjugate (PCV13) vaccine. One dose is recommended after age 1. Pneumococcal polysaccharide (PPSV23) vaccine. One dose is recommended after age 62. Talk to your health care provider about which screenings and vaccines you need and how often you need them. This information is not intended to replace advice given to you by your health care provider. Make sure you discuss any questions you have with your health care provider. Document Released: 12/11/2015 Document Revised: 08/03/2016 Document Reviewed: 09/15/2015 Elsevier Interactive Patient Education  2017 [employer].  Fall Prevention in the Home Falls can cause injuries. They can happen to people of all ages. There are many things you can do to make your home safe and to help prevent falls. What can I do on the outside of my home? Regularly fix the edges of walkways and driveways and fix any cracks. Remove anything that might make you trip as you walk through a door, such as a raised step or threshold. Trim any bushes or trees on the path to your home. Use bright outdoor lighting. Clear any walking paths of anything that might make someone trip, such as rocks or tools. Regularly check to see if handrails are loose or broken. Make sure that both sides of any steps have handrails. Any raised decks and porches should have guardrails on the edges. Have any leaves, snow, or ice cleared regularly. Use sand or salt on walking paths during winter. Clean up any spills in your garage right away. This includes oil or grease spills. What can I do in the bathroom? Use night lights. Install grab bars by the toilet and in the tub and shower. Do not use towel bars as grab bars. Use non-skid mats or decals in the tub or shower. If you need to sit down in the shower, use a plastic, non-slip stool. Keep the floor dry. Clean up any water that spills on the floor as soon as it happens. Remove soap buildup in the tub or shower  regularly. Attach bath mats securely with double-sided non-slip rug tape. Do not have throw rugs and other things on the floor that can make you trip. What can I do in the bedroom? Use night lights. Make sure that you have a light by your bed that is easy to reach. Do not use any sheets or blankets that are too big for your bed. They should not hang down onto the floor. Have a firm chair that has side arms. You can use this for support while you get dressed. Do not have throw rugs and other things on the floor that can make you trip. What can I do in the kitchen? Clean up any spills right away. Avoid walking on wet floors. Keep items that you use a lot in easy-to-reach places. If you need to reach something above you, use a strong step stool that has a grab bar. Keep electrical cords out of the way. Do not use floor polish or wax that makes floors slippery. If you must use wax, use non-skid floor wax. Do not have throw rugs and other things on the floor that can make you trip. What can I do with my stairs? Do not leave any items on the stairs. Make sure that there are handrails on both sides  of the stairs and use them. Fix handrails that are broken or loose. Make sure that handrails are as long as the stairways. Check any carpeting to make sure that it is firmly attached to the stairs. Fix any carpet that is loose or worn. Avoid having throw rugs at the top or bottom of the stairs. If you do have throw rugs, attach them to the floor with carpet tape. Make sure that you have a light switch at the top of the stairs and the bottom of the stairs. If you do not have them, ask someone to add them for you. What else can I do to help prevent falls? Wear shoes that: Do not have high heels. Have rubber bottoms. Are comfortable and fit you well. Are closed at the toe. Do not wear sandals. If you use a stepladder: Make sure that it is fully opened. Do not climb a closed stepladder. Make sure that  both sides of the stepladder are locked into place. Ask someone to hold it for you, if possible. Clearly mark and make sure that you can see: Any grab bars or handrails. First and last steps. Where the edge of each step is. Use tools that help you move around (mobility aids) if they are needed. These include: Canes. Walkers. Scooters. Crutches. Turn on the lights when you go into a dark area. Replace any light bulbs as soon as they burn out. Set up your furniture so you have a clear path. Avoid moving your furniture around. If any of your floors are uneven, fix them. If there are any pets around you, be aware of where they are. Review your medicines with your doctor. Some medicines can make you feel dizzy. This can increase your chance of falling. Ask your doctor what other things that you can do to help prevent falls. This information is not intended to replace advice given to you by your health care provider. Make sure you discuss any questions you have with your health care provider. Document Released: 09/10/2009 Document Revised: 04/21/2016 Document Reviewed: 12/19/2014 Elsevier Interactive Patient Education  2017 [employer].

## 2023-07-11 ENCOUNTER — Ambulatory Visit: Payer: PPO | Admitting: Dermatology

## 2023-07-12 ENCOUNTER — Ambulatory Visit: Payer: PPO | Admitting: Dermatology

## 2023-07-12 VITALS — BP 134/73

## 2023-07-12 DIAGNOSIS — D1801 Hemangioma of skin and subcutaneous tissue: Secondary | ICD-10-CM

## 2023-07-12 NOTE — Progress Notes (Unsigned)
   Follow-Up Visit   Subjective  Rodney Branch is a 89 y.o. male who presents for the following: Excision of cyst vs other of left great toe plantar surface The following portions of the chart were reviewed this encounter and updated as appropriate: medications, allergies, medical history  Review of Systems:  No other skin or systemic complaints except as noted in HPI or Assessment and Plan.  Objective  Well appearing patient in no apparent distress; mood and affect are within normal limits. A focused examination was performed of the following areas: Left foot Relevant physical exam findings are noted in the Assessment and Plan.   Left great toe plantar surface 1.1 x 0.6 cm cystic papule   Assessment & Plan   Neoplasm of uncertain behavior of skin Left great toe plantar surface  Skin excision  Lesion length (cm):  1.1 Lesion width (cm):  0.6 Margin per side (cm):  0 Total excision diameter (cm):  1.1 Informed consent: discussed and consent obtained   Timeout: patient name, date of birth, surgical site, and procedure verified   Procedure prep:  Patient was prepped and draped in usual sterile fashion Prep type:  Isopropyl alcohol and povidone-iodine Anesthesia: the lesion was anesthetized in a standard fashion   Anesthetic:  1% lidocaine w/ epinephrine 1-100,000 buffered w/ 8.4% NaHCO3 (2.5 cc) Instrument used: #15 blade   Hemostasis achieved with: pressure   Hemostasis achieved with comment:  Electrocautery Outcome: patient tolerated procedure well with no complications   Post-procedure details: sterile dressing applied and wound care instructions given   Dressing type: bandage and pressure dressing (mupirocin)    Skin repair Complexity:  Complex Final length (cm):  2.7 Informed consent: discussed and consent obtained   Timeout: patient name, date of birth, surgical site, and procedure verified   Procedure prep:  Patient was prepped and draped in usual sterile  fashion Prep type:  Povidone-iodine (Alcohol) Anesthesia: the lesion was anesthetized in a standard fashion   Anesthetic:  1% lidocaine w/ epinephrine 1-100,000 buffered w/ 8.4% NaHCO3 Reason for type of repair: reduce tension to allow closure, reduce the risk of dehiscence, infection, and necrosis, reduce subcutaneous dead space and avoid a hematoma, allow closure of the large defect, preserve normal anatomy, preserve normal anatomical and functional relationships and enhance both functionality and cosmetic results   Undermining: area extensively undermined   Undermining comment:  1.1 Subcutaneous layers (deep stitches):  Suture size:  3-0 Suture type: Vicryl (polyglactin 910)   Stitches:  Buried horizontal mattress (Inverted dermal) Fine/surface layer approximation (top stitches):  Suture size:  3-0 Suture type: nylon   Suture type comment:  Nylon Stitches: horizontal mattress   Stitches comment:  Nylon Suture removal (days):  7 Hemostasis achieved with: suture and pressure Hemostasis achieved with comment:  Electrocautery Outcome: patient tolerated procedure well with no complications   Post-procedure details: sterile dressing applied and wound care instructions given   Dressing type: pressure dressing and bandage (Mupirocin)   Additional details:  Undermining Defect measured 2.2 cm  Specimen 1 - Surgical pathology Differential Diagnosis: Cyst vs other  Check Margins: No   Return in about 1 week (around 07/11/2023) for suture removal.  I, Joanie Coddington, CMA, am acting as scribe for Armida Sans, MD .  Documentation: I have reviewed the above documentation for accuracy and completeness, and I agree with the above.  Armida Sans, MD

## 2023-07-12 NOTE — Patient Instructions (Signed)

## 2023-07-12 NOTE — Progress Notes (Signed)
   Follow-Up Visit   Subjective  Rodney Branch is a 89 y.o. male who presents for the following: Post op f/u L great toe plantar surface, ARTERIOVENOUS HEMANGIOMA, 1 wk f/u  Patient accompanied by wife who contributes to history.  The following portions of the chart were reviewed this encounter and updated as appropriate: medications, allergies, medical history  Review of Systems:  No other skin or systemic complaints except as noted in HPI or Assessment and Plan.  Objective  Well appearing patient in no apparent distress; mood and affect are within normal limits.   A focused examination was performed of the following areas: L foot  Relevant exam findings are noted in the Assessment and Plan.    Assessment & Plan   HEMANGIOMA, BX PROVEN Exam: healing excision site L great toe plantar surface  Treatment Plan: Benign  Encounter for Removal of Sutures - Incision site at the L great toe plantar surface is clean, dry and intact - Wound cleansed, sutures removed, wound cleansed and steri strips applied.  - Discussed pathology results showing ARTERIOVENOUS HEMANGIOMA  - Patient advised to keep steri-strips dry until they fall off. - Scars remodel for a full year. - Once steri-strips fall off, patient can apply over-the-counter silicone scar cream each night to help with scar remodeling if desired. - Patient advised to call with any concerns or if they notice any new or changing lesions.      Return for as scheduled for TBSE.  I, Ardis Rowan, RMA, am acting as scribe for Armida Sans, MD .   Documentation: I have reviewed the above documentation for accuracy and completeness, and I agree with the above.  Armida Sans, MD

## 2023-07-13 ENCOUNTER — Encounter: Payer: Self-pay | Admitting: Dermatology

## 2023-07-19 ENCOUNTER — Other Ambulatory Visit: Payer: Self-pay | Admitting: Psychiatry

## 2023-07-19 DIAGNOSIS — F5105 Insomnia due to other mental disorder: Secondary | ICD-10-CM

## 2023-07-19 DIAGNOSIS — F319 Bipolar disorder, unspecified: Secondary | ICD-10-CM

## 2023-07-21 ENCOUNTER — Ambulatory Visit: Payer: PPO | Admitting: Family Medicine

## 2023-07-23 ENCOUNTER — Encounter: Payer: Self-pay | Admitting: Dermatology

## 2023-07-28 ENCOUNTER — Encounter: Payer: Self-pay | Admitting: Family Medicine

## 2023-07-28 ENCOUNTER — Ambulatory Visit (INDEPENDENT_AMBULATORY_CARE_PROVIDER_SITE_OTHER): Payer: PPO | Admitting: Family Medicine

## 2023-07-28 VITALS — BP 116/74 | HR 93 | Temp 97.8°F | Ht 67.0 in | Wt 140.0 lb

## 2023-07-28 DIAGNOSIS — N400 Enlarged prostate without lower urinary tract symptoms: Secondary | ICD-10-CM | POA: Diagnosis not present

## 2023-07-28 DIAGNOSIS — K219 Gastro-esophageal reflux disease without esophagitis: Secondary | ICD-10-CM

## 2023-07-28 DIAGNOSIS — D72819 Decreased white blood cell count, unspecified: Secondary | ICD-10-CM | POA: Diagnosis not present

## 2023-07-28 LAB — COMPREHENSIVE METABOLIC PANEL
ALT: 12 U/L (ref 0–53)
AST: 15 U/L (ref 0–37)
Albumin: 3.9 g/dL (ref 3.5–5.2)
Alkaline Phosphatase: 107 U/L (ref 39–117)
BUN: 22 mg/dL (ref 6–23)
CO2: 29 mEq/L (ref 19–32)
Calcium: 8.8 mg/dL (ref 8.4–10.5)
Chloride: 106 mEq/L (ref 96–112)
Creatinine, Ser: 1.02 mg/dL (ref 0.40–1.50)
GFR: 64.82 mL/min (ref >60.00)
Glucose, Bld: 79 mg/dL (ref 70–99)
Potassium: 4.3 mEq/L (ref 3.5–5.1)
Sodium: 142 mEq/L (ref 135–145)
Total Bilirubin: 0.4 mg/dL (ref 0.2–1.2)
Total Protein: 6.5 g/dL (ref 6.0–8.3)

## 2023-07-28 LAB — CBC WITH DIFFERENTIAL/PLATELET
Basophils Absolute: 0 10*3/uL (ref 0.0–0.1)
Basophils Relative: 1 % (ref 0.0–3.0)
Eosinophils Absolute: 0.1 10*3/uL (ref 0.0–0.7)
Eosinophils Relative: 2.7 % (ref 0.0–5.0)
HCT: 37.2 % — ABNORMAL LOW (ref 39.0–52.0)
Hemoglobin: 12 g/dL — ABNORMAL LOW (ref 13.0–17.0)
Lymphocytes Relative: 19.5 % (ref 12.0–46.0)
Lymphs Abs: 0.7 10*3/uL (ref 0.7–4.0)
MCHC: 32.2 g/dL (ref 30.0–36.0)
MCV: 91.4 fl (ref 78.0–100.0)
Monocytes Absolute: 0.4 10*3/uL (ref 0.1–1.0)
Monocytes Relative: 12.2 % — ABNORMAL HIGH (ref 3.0–12.0)
Neutro Abs: 2.3 10*3/uL (ref 1.4–7.7)
Neutrophils Relative %: 64.6 % (ref 43.0–77.0)
Platelets: 284 10*3/uL (ref 150.0–400.0)
RBC: 4.07 Mil/uL — ABNORMAL LOW (ref 4.22–5.81)
RDW: 14.5 % (ref 11.5–15.5)
WBC: 3.6 10*3/uL — ABNORMAL LOW (ref 4.0–10.5)

## 2023-07-28 MED ORDER — DOXAZOSIN MESYLATE 8 MG PO TABS
8.0000 mg | ORAL_TABLET | Freq: Every day | ORAL | 3 refills | Status: DC
Start: 2023-07-28 — End: 2024-07-30

## 2023-07-28 NOTE — Progress Notes (Signed)
Left message to call the office back regarding lab results  

## 2023-07-28 NOTE — Assessment & Plan Note (Signed)
Chronic issue.  Generally well-controlled.  Patient will continue Cardura 8 mg daily.

## 2023-07-28 NOTE — Progress Notes (Signed)
Marikay Alar, MD Phone: [phone]  Rodney Branch is a 89 y.o. male who presents today for follow-up.  BPH: Strain- no Flow- good Frequency- no Urgency- not typically Nocturia- 1x Emptying bladder- yes Medication- cardura  GERD:   Reflux symptoms: no   Abd pain: no   Blood in stool: no  Dysphagia: no   EGD: no  Medication: no medications   Social History   Tobacco Use  Smoking Status Never  Smokeless Tobacco Never    Current Outpatient Medications on File Prior to Visit  Medication Sig Dispense Refill   Cholecalciferol (VITAMIN D) 50 MCG (2000 UT) CAPS Take 1 capsule by mouth daily.     FLUAD QUADRIVALENT 0.5 ML injection      hydrocortisone 2.5 % cream Apply topically 3 (three) times a week. Apply to scaly rash on chest and behind ears 3 days a week, Monday, Wednesday and Fridays 45 g 11   mupirocin ointment (BACTROBAN) 2 % Apply 1 Application topically daily. Qd to excision site 22 g 0   omeprazole (PRILOSEC) 20 MG capsule Take 1 capsule by mouth once daily 90 capsule 0   PARoxetine (PAXIL) 30 MG tablet Take 1 tablet (30 mg total) by mouth every morning. 90 tablet 0   polyethylene glycol powder (GLYCOLAX/MIRALAX) 17 GM/SCOOP powder Take 17 g by mouth daily as needed. 850 g 0   primidone (MYSOLINE) 50 MG tablet Take 3 tablets (150 mg total) by mouth at bedtime. 270 tablet 3   QUEtiapine (SEROQUEL) 300 MG tablet TAKE 2 TABLETS BY MOUTH AT BEDTIME 180 tablet 1   vitamin B-12 (CYANOCOBALAMIN) 1000 MCG tablet Take 1,000 mcg by mouth daily.     No current facility-administered medications on file prior to visit.     ROS see history of present illness  Objective  Physical Exam Vitals:   07/28/23 1115  BP: 116/74  Pulse: 93  Temp: 97.8 F (36.6 C)  SpO2: 98%    BP Readings from Last 3 Encounters:  07/28/23 116/74  07/12/23 134/73  07/04/23 136/78   Wt Readings from Last 3 Encounters:  07/28/23 140 lb (63.5 kg)  07/10/23 143 lb (64.9 kg)  01/04/23  140 lb 12.8 oz (63.9 kg)    Physical Exam Constitutional:      General: He is not in acute distress.    Appearance: He is not diaphoretic.  Cardiovascular:     Rate and Rhythm: Normal rate and regular rhythm.     Heart sounds: Normal heart sounds.  Pulmonary:     Effort: Pulmonary effort is normal.     Breath sounds: Normal breath sounds.  Musculoskeletal:     Right lower leg: No edema.     Left lower leg: No edema.  Lymphadenopathy:     Cervical: No cervical adenopathy.  Skin:    General: Skin is warm and dry.  Neurological:     Mental Status: He is alert.      Assessment/Plan: Please see individual problem list.  Gastroesophageal reflux disease without esophagitis Assessment & Plan: Chronic issue.  Asymptomatic on no medication.  Patient will monitor for recurrence.   BPH without obstruction/lower urinary tract symptoms Assessment & Plan: Chronic issue.  Generally well-controlled.  Patient will continue Cardura 8 mg daily.  Orders: -     Comprehensive metabolic panel -     Doxazosin Mesylate; Take 1 tablet (8 mg total) by mouth daily.  Dispense: 90 tablet; Refill: 3  Leukopenia, unspecified type -  CBC with Differential/Platelet     Return in about 1 year (around 07/27/2024).   Marikay Alar, MD [facility]

## 2023-07-28 NOTE — Assessment & Plan Note (Signed)
Chronic issue.  Asymptomatic on no medication.  Patient will monitor for recurrence.

## 2023-08-01 ENCOUNTER — Telehealth: Payer: Self-pay | Admitting: Family Medicine

## 2023-08-01 ENCOUNTER — Telehealth: Payer: Self-pay

## 2023-08-01 DIAGNOSIS — D649 Anemia, unspecified: Secondary | ICD-10-CM

## 2023-08-01 DIAGNOSIS — D72819 Decreased white blood cell count, unspecified: Secondary | ICD-10-CM

## 2023-08-01 NOTE — Telephone Encounter (Signed)
Patient's wife returned office phone call. Patient would like a referral to hematology at the cancer center.

## 2023-08-01 NOTE — Telephone Encounter (Signed)
Called Patient back- no answer

## 2023-08-01 NOTE — Telephone Encounter (Signed)
-----   Message from Marikay Alar sent at 07/28/2023  3:31 PM EDT ----- Please let the patient know that his white blood cell count and hemoglobin are still mildly low.  They are generally stable.  His wife mentioned having him see hematology at the cancer center.  Does he still want to do that?

## 2023-08-01 NOTE — Telephone Encounter (Signed)
Pt wife called about pt blood work that was taken on friday

## 2023-08-01 NOTE — Telephone Encounter (Signed)
Left the wife a message to call back regarding the lab results below.

## 2023-08-02 NOTE — Addendum Note (Signed)
Addended by: Birdie Sons, Lorann Tani G on: 08/02/2023 01:00 PM   Modules accepted: Orders

## 2023-08-02 NOTE — Telephone Encounter (Signed)
Referral placed.

## 2023-08-07 ENCOUNTER — Encounter: Payer: Self-pay | Admitting: Internal Medicine

## 2023-08-07 ENCOUNTER — Inpatient Hospital Stay: Payer: PPO

## 2023-08-07 ENCOUNTER — Inpatient Hospital Stay: Payer: PPO | Attending: Internal Medicine | Admitting: Internal Medicine

## 2023-08-07 VITALS — BP 104/58 | HR 108 | Temp 97.7°F | Ht 67.0 in | Wt 140.2 lb

## 2023-08-07 DIAGNOSIS — K8020 Calculus of gallbladder without cholecystitis without obstruction: Secondary | ICD-10-CM | POA: Insufficient documentation

## 2023-08-07 DIAGNOSIS — D649 Anemia, unspecified: Secondary | ICD-10-CM

## 2023-08-07 DIAGNOSIS — D72819 Decreased white blood cell count, unspecified: Secondary | ICD-10-CM | POA: Diagnosis not present

## 2023-08-07 DIAGNOSIS — Z800 Family history of malignant neoplasm of digestive organs: Secondary | ICD-10-CM | POA: Insufficient documentation

## 2023-08-07 DIAGNOSIS — M2550 Pain in unspecified joint: Secondary | ICD-10-CM | POA: Insufficient documentation

## 2023-08-07 DIAGNOSIS — I959 Hypotension, unspecified: Secondary | ICD-10-CM | POA: Insufficient documentation

## 2023-08-07 DIAGNOSIS — E538 Deficiency of other specified B group vitamins: Secondary | ICD-10-CM | POA: Insufficient documentation

## 2023-08-07 LAB — CBC WITH DIFFERENTIAL/PLATELET
Abs Immature Granulocytes: 0.09 10*3/uL — ABNORMAL HIGH (ref 0.00–0.07)
Basophils Absolute: 0 10*3/uL (ref 0.0–0.1)
Basophils Relative: 0 %
Eosinophils Absolute: 0.1 10*3/uL (ref 0.0–0.5)
Eosinophils Relative: 1 %
HCT: 36.6 % — ABNORMAL LOW (ref 39.0–52.0)
Hemoglobin: 11.3 g/dL — ABNORMAL LOW (ref 13.0–17.0)
Immature Granulocytes: 2 %
Lymphocytes Relative: 14 %
Lymphs Abs: 0.8 10*3/uL (ref 0.7–4.0)
MCH: 29.4 pg (ref 26.0–34.0)
MCHC: 30.9 g/dL (ref 30.0–36.0)
MCV: 95.3 fL (ref 80.0–100.0)
Monocytes Absolute: 0.6 10*3/uL (ref 0.1–1.0)
Monocytes Relative: 10 %
Neutro Abs: 4.1 10*3/uL (ref 1.7–7.7)
Neutrophils Relative %: 73 %
Platelets: 273 10*3/uL (ref 150–400)
RBC: 3.84 MIL/uL — ABNORMAL LOW (ref 4.22–5.81)
RDW: 14.4 % (ref 11.5–15.5)
WBC: 5.7 10*3/uL (ref 4.0–10.5)
nRBC: 0 % (ref 0.0–0.2)

## 2023-08-07 LAB — RETICULOCYTES
Immature Retic Fract: 7.6 % (ref 2.3–15.9)
RBC.: 3.84 MIL/uL — ABNORMAL LOW (ref 4.22–5.81)
Retic Count, Absolute: 33.8 10*3/uL (ref 19.0–186.0)
Retic Ct Pct: 0.9 % (ref 0.4–3.1)

## 2023-08-07 LAB — HEPATITIS C ANTIBODY: HCV Ab: NONREACTIVE

## 2023-08-07 LAB — COMPREHENSIVE METABOLIC PANEL
ALT: 13 U/L (ref 0–44)
AST: 17 U/L (ref 15–41)
Albumin: 3.7 g/dL (ref 3.5–5.0)
Alkaline Phosphatase: 105 U/L (ref 38–126)
Anion gap: 8 (ref 5–15)
BUN: 21 mg/dL (ref 8–23)
CO2: 23 mmol/L (ref 22–32)
Calcium: 8.3 mg/dL — ABNORMAL LOW (ref 8.9–10.3)
Chloride: 106 mmol/L (ref 98–111)
Creatinine, Ser: 1.19 mg/dL (ref 0.61–1.24)
GFR, Estimated: 58 mL/min — ABNORMAL LOW (ref >60)
Glucose, Bld: 114 mg/dL — ABNORMAL HIGH (ref 70–99)
Potassium: 3.6 mmol/L (ref 3.5–5.1)
Sodium: 137 mmol/L (ref 135–145)
Total Bilirubin: 0.3 mg/dL (ref 0.3–1.2)
Total Protein: 6.6 g/dL (ref 6.5–8.1)

## 2023-08-07 LAB — TECHNOLOGIST SMEAR REVIEW: Plt Morphology: NORMAL

## 2023-08-07 LAB — IRON AND TIBC
Iron: 84 ug/dL (ref 45–182)
Saturation Ratios: 31 % (ref 17.9–39.5)
TIBC: 267 ug/dL (ref 250–450)
UIBC: 183 ug/dL

## 2023-08-07 LAB — FERRITIN: Ferritin: 28 ng/mL (ref 24–336)

## 2023-08-07 LAB — HEPATITIS B CORE ANTIBODY, IGM: Hep B C IgM: NONREACTIVE

## 2023-08-07 LAB — VITAMIN B12: Vitamin B-12: 153 pg/mL — ABNORMAL LOW (ref 180–914)

## 2023-08-07 LAB — HEPATITIS B SURFACE ANTIGEN: Hepatitis B Surface Ag: NONREACTIVE

## 2023-08-07 LAB — LACTATE DEHYDROGENASE: LDH: 134 U/L (ref 98–192)

## 2023-08-07 LAB — FOLATE: Folate: 13.2 ng/mL (ref >5.9)

## 2023-08-07 NOTE — Progress Notes (Signed)
Appetite is 25% normal, hurts to swallow.   Having constipation, takes miralax.
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
## 2023-08-07 NOTE — Assessment & Plan Note (Addendum)
#[  2017] chronic mild leukopenia/intermittent neutropenia; hemoglobin-mildly low at 12.  Again chronic.  Normal platelets.  # A long discussion with patient regarding the potential etiologies of low white count including but not limited to liver spleen; autoimmune diseases; antibiotics/medications-viral infection check HIV/hepatitis.  Also discussed the possibility of underlying bone marrow problems/diseases-like low-grade MDS.  # However given the chronicity of his slightly low blood counts-especially as asymptomatic I would recommend blood work at this time.  And also order an ultrasound of the abdomen.  Hold off a bone marrow biopsy at this time.  # Hypotension: ? Etiology; not symptomatic-repeat blood pressure [104/58] within normal limits.  Thank you Dr. Birdie Sons for allowing me to participate in the care of your pleasant patient. Please do not hesitate to contact me with questions or concerns in the interim.  # DISPOSITION:  # re-check the blood pressure # labs today- # follow up in 2-3 weeks- MD: no labs- US abdomen prior- Dr.B

## 2023-08-08 LAB — KAPPA/LAMBDA LIGHT CHAINS
Kappa free light chain: 26.7 mg/L — ABNORMAL HIGH (ref 3.3–19.4)
Kappa, lambda light chain ratio: 1.64 (ref 0.26–1.65)
Lambda free light chains: 16.3 mg/L (ref 5.7–26.3)

## 2023-08-14 ENCOUNTER — Telehealth: Payer: Self-pay

## 2023-08-14 ENCOUNTER — Ambulatory Visit
Admission: RE | Admit: 2023-08-14 | Discharge: 2023-08-14 | Disposition: A | Discharge status: Home / Self Care | Payer: PPO | Source: Ambulatory Visit | Attending: Internal Medicine | Admitting: Internal Medicine

## 2023-08-14 DIAGNOSIS — D649 Anemia, unspecified: Secondary | ICD-10-CM | POA: Diagnosis not present

## 2023-08-14 DIAGNOSIS — I714 Abdominal aortic aneurysm, without rupture, unspecified: Secondary | ICD-10-CM | POA: Diagnosis not present

## 2023-08-14 DIAGNOSIS — R161 Splenomegaly, not elsewhere classified: Secondary | ICD-10-CM | POA: Diagnosis not present

## 2023-08-14 DIAGNOSIS — D72819 Decreased white blood cell count, unspecified: Secondary | ICD-10-CM | POA: Diagnosis not present

## 2023-08-14 DIAGNOSIS — K8020 Calculus of gallbladder without cholecystitis without obstruction: Secondary | ICD-10-CM | POA: Diagnosis not present

## 2023-08-14 NOTE — Telephone Encounter (Signed)
Telephone encounter was:  Successful.  08/14/2023 Name: Rodney Branch MRN: [id number] DOB: [birth year]  Rodney Branch is a 89 y.o. year old male who is a primary care patient of Rodney Branch, Rodney Mao, MD . The community resource team was consulted for assistance with Transportation Needs   Care guide performed the following interventions: Patient provided with information about care guide support team and interviewed to confirm resource needs.Patient has transportation needs for 9/27 .[employer] has accepted ride [id number] for [name] on 08/25/2023. Round trip   Follow Up Plan:  No further follow up planned at this time. The patient has been provided with needed resources.    Rodney Branch Rodney Branch  [facility], [facility] Guide, Phone: [phone] Website: [email or web address]      Rodney Branch DOB: 09/14/1931 MRN: [id number]   Rodney Branch  For purposes of improving physical access to our facilities, [facility] is pleased to partner with third parties to provide Rodney Branch patients or other authorized individuals the option of convenient, on-demand ground transportation services (the [employer]") through use of the technology service that enables users to request on-demand ground transportation from independent third-party providers.  By opting to use and accept these [employer], I, the undersigned, hereby agree on behalf of myself, and on behalf of any minor child using the Science writer for whom I am the parent or legal guardian, as follows:  Science writer provided to me are provided by independent third-party transportation providers who are not [employer] or employees and who are unaffiliated with [employer]. [facility] is neither a transportation carrier nor a common or public carrier. [facility] has no control over the quality or safety of the  transportation that occurs as a result of the [employer]. [facility] cannot guarantee that any third-party transportation provider will complete any arranged transportation service. [facility] makes no representation, warranty, or guarantee regarding the reliability, timeliness, quality, safety, suitability, or availability of any of the Transport Services or that they will be error free. I fully understand that traveling by vehicle involves risks and dangers of serious bodily injury, including permanent disability, paralysis, and death. I agree, on behalf of myself and on behalf of any minor child using the Transport Services for whom I am the parent or legal guardian, that the entire risk arising out of my use of the [employer] remains solely with me, to the maximum extent permitted under applicable law. The [employer] are provided "as is" and "as available." [facility] disclaims all representations and warranties, express, implied or statutory, not expressly set out in these terms, including the implied warranties of merchantability and fitness for a particular purpose. I hereby waive and release [place], its agents, employees, officers, directors, representatives, insurers, attorneys, assigns, successors, subsidiaries, and affiliates from any and all past, present, or future claims, demands, liabilities, actions, causes of action, or suits of any kind directly or indirectly arising from acceptance and use of the [employer]. I further waive and release [facility] and its affiliates from all present and future Branch and responsibility for any injury or death to persons or damages to property caused by or related to the use of the [employer]. I have read this Waiver and Release of Branch, and I understand the terms used in it and their legal significance. This Waiver is freely and voluntarily given with the understanding that my right (  as well as the  right of any minor child for whom I am the parent or legal guardian using the [employer]) to legal recourse against [place] in connection with the [employer] is knowingly surrendered in return for use of these services.   I attest that I read the consent document to Rodney Branch, gave Rodney Branch the opportunity to ask questions and answered the questions asked (if any). I affirm that Rodney Branch then provided consent for he's participation in this program.     Rodney Branch

## 2023-08-15 LAB — MULTIPLE MYELOMA PANEL, SERUM
Albumin SerPl Elph-Mcnc: 3.4 g/dL (ref 2.9–4.4)
Albumin/Glob SerPl: 1.2 (ref 0.7–1.7)
Alpha 1: 0.3 g/dL (ref 0.0–0.4)
Alpha2 Glob SerPl Elph-Mcnc: 0.9 g/dL (ref 0.4–1.0)
B-Globulin SerPl Elph-Mcnc: 0.9 g/dL (ref 0.7–1.3)
Gamma Glob SerPl Elph-Mcnc: 0.8 g/dL (ref 0.4–1.8)
Globulin, Total: 2.9 g/dL (ref 2.2–3.9)
IgA: 79 mg/dL (ref 61–437)
IgG (Immunoglobin G), Serum: 927 mg/dL (ref 603–1613)
IgM (Immunoglobulin M), Srm: 30 mg/dL (ref 15–143)
Total Protein ELP: 6.3 g/dL (ref 6.0–8.5)

## 2023-08-16 ENCOUNTER — Telehealth: Payer: Self-pay

## 2023-08-16 NOTE — Telephone Encounter (Signed)
Telephone encounter was:  Successful.  08/16/2023 Name: Rodney Branch MRN: [id number] DOB: 03/18/1931  Rodney Branch is a 89 y.o. year old male who is a primary care patient of Birdie Sons, Yehuda Mao, MD . The community resource team was consulted for assistance with Transportation Needs   Care guide performed the following interventions: Patient provided with information about care guide support team and interviewed to confirm resource needs.Transportation was canceled by PT   Follow Up Plan:  No further follow up planned at this time. The patient has been provided with needed resources.    Lenard Forth [place]  [facility], [facility] Guide, Phone: [phone] Website: [email or web address]

## 2023-08-25 ENCOUNTER — Encounter: Payer: Self-pay | Admitting: Internal Medicine

## 2023-08-25 ENCOUNTER — Inpatient Hospital Stay: Payer: PPO | Admitting: Internal Medicine

## 2023-08-25 VITALS — BP 136/86 | HR 86 | Temp 97.9°F | Resp 16 | Wt 139.0 lb

## 2023-08-25 DIAGNOSIS — D649 Anemia, unspecified: Secondary | ICD-10-CM | POA: Diagnosis not present

## 2023-08-25 NOTE — Assessment & Plan Note (Signed)
#[  2017] chronic mild leukopenia/intermittent neutropenia; hemoglobin-mildly low at 12.  Again chronic.  Normal platelets.  September 2024 white count normal hemoglobin 11; normocytic.  Hepatitis panel negative Kappa lambda light chain ratio M protein negative.  Ferritin 28 saturation 51.  LDH normal.-Haptoglobin not done.  Etiology unclear however she B12 deficiency below. [place] SEP 2024- Spleen is normal in size.    # B12 deficiency: recommend B12 1000 mcg/day- PO; recommend Iron every other day.   # I had  long discussion with patient regarding the potential etiologies of low  hemoglobin sec to B12 deficiency vs other bone marrow causes. I discussed the possibility of underlying bone marrow problems/diseases-like low-grade MDS.  Hold off a bone marrow biopsy at this time.  However if anemia worsens or does not improve at the next visit would recommend a bone marrow biopsy.  #Incidental findings on Imaging  SEP [place] 2024: Cholelithiasis without sonographic evidence of acute cholecystitis;  Proximal abdominal aortic aneurysm measuring 3.1 cm. Recommend follow-up ultrasound every 3 years; I reviewed/discussed/counseled the patient.   # DISPOSITION:  # follow up in  4 months - MD: 1 week prior- labs- cbc/cmp; LDH; haptoglobin; iron studies; ferritin- B12 levels- Dr.B
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
## 2023-08-25 NOTE — Patient Instructions (Signed)
#   recommend B12 1000 mcg/day- OTC.  #  recommend Iron every other day.

## 2023-08-25 NOTE — Progress Notes (Signed)
Eats 2 meals per day.  Takes iron pill daily.  Has low energy. Denies dizziness . Regular Bowel movements. No rectal bleeding.

## 2023-08-28 ENCOUNTER — Telehealth: Payer: Self-pay | Admitting: Psychiatry

## 2023-08-28 NOTE — Telephone Encounter (Signed)
Rodney Branch called at 2:25 wanting to make an appt to come in and have your complete a DMV form for physicians for them to evaluate him getting his drivers license. It is the form the has pages for every kind of dr.  Bonita Quin would be responsible to the psychiatric portion.  It needs to be done and back to the [facility] by the end of the month. There are no open appts except for work in.  I told them the bring [place] the form or get it to [place] it could probably be done without an appt.  But he wants sto make it gets done and wants an appt.  IF you are willing to do this form, do you want an appt?  If so can I put him in a work in slot?

## 2023-08-28 NOTE — Telephone Encounter (Signed)
No work in slots for non emergencies and this is not an emergency.  He can go on the wait list.

## 2023-09-01 ENCOUNTER — Ambulatory Visit: Payer: PPO | Admitting: Family Medicine

## 2023-09-04 ENCOUNTER — Encounter: Payer: Self-pay | Admitting: Family Medicine

## 2023-09-04 ENCOUNTER — Ambulatory Visit (INDEPENDENT_AMBULATORY_CARE_PROVIDER_SITE_OTHER): Payer: PPO | Admitting: Family Medicine

## 2023-09-04 ENCOUNTER — Ambulatory Visit (INDEPENDENT_AMBULATORY_CARE_PROVIDER_SITE_OTHER): Payer: PPO | Admitting: Psychiatry

## 2023-09-04 ENCOUNTER — Encounter: Payer: Self-pay | Admitting: Psychiatry

## 2023-09-04 VITALS — BP 114/68 | HR 92 | Temp 97.6°F | Ht 67.0 in | Wt 140.4 lb

## 2023-09-04 DIAGNOSIS — F319 Bipolar disorder, unspecified: Secondary | ICD-10-CM

## 2023-09-04 DIAGNOSIS — G250 Essential tremor: Secondary | ICD-10-CM

## 2023-09-04 DIAGNOSIS — R413 Other amnesia: Secondary | ICD-10-CM | POA: Diagnosis not present

## 2023-09-04 DIAGNOSIS — F02A0 Dementia in other diseases classified elsewhere, mild, without behavioral disturbance, psychotic disturbance, mood disturbance, and anxiety: Secondary | ICD-10-CM | POA: Diagnosis not present

## 2023-09-04 DIAGNOSIS — K219 Gastro-esophageal reflux disease without esophagitis: Secondary | ICD-10-CM

## 2023-09-04 DIAGNOSIS — F5105 Insomnia due to other mental disorder: Secondary | ICD-10-CM | POA: Diagnosis not present

## 2023-09-04 DIAGNOSIS — G301 Alzheimer's disease with late onset: Secondary | ICD-10-CM

## 2023-09-04 NOTE — Patient Instructions (Addendum)
Reduce primidone to 2 tablets at night Continue quetiapine at 2 of the 300 mg tablets at night Continue paroxetine 1 daily
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
## 2023-09-05 LAB — TSH: TSH: 2.49 u[IU]/mL (ref 0.35–5.50)

## 2023-09-05 LAB — VITAMIN B12: Vitamin B-12: 368 pg/mL (ref 211–911)

## 2023-09-06 NOTE — Assessment & Plan Note (Signed)
Patient with some discomfort after eating.  Suspect likely related to reflux.  Has not recurred the last few nights.  He will monitor for now.

## 2023-09-06 NOTE — Assessment & Plan Note (Signed)
Patient with memory difficulty concerning for dementia particularly in the setting of his MMSE score.  Discussed that this could also be related to chronic depression as that can affect memory.  Advised we will check TSH and B12 to determine if there is a reversible cause of his memory deficit.  Discussed with the patient and his family that he would need to pass a driving test to figure out if he is able to drive moving forward.  Advised that is how I would fill out the paperwork.

## 2023-09-08 ENCOUNTER — Telehealth: Payer: Self-pay | Admitting: Family Medicine

## 2023-09-08 DIAGNOSIS — Z961 Presence of intraocular lens: Secondary | ICD-10-CM | POA: Diagnosis not present

## 2023-09-08 NOTE — Telephone Encounter (Signed)
Wife came in and dropped off information Dr Birdie Sons requested to go with DMV forms. Front office will hand deliver to [facility] or provider.

## 2023-09-11 NOTE — Telephone Encounter (Signed)
Received paperwork and it has been filed out and faxed and mailed to the [facility] [employer] of [employer] in [place].

## 2023-09-28 ENCOUNTER — Ambulatory Visit: Payer: PPO | Admitting: Psychiatry

## 2023-10-18 ENCOUNTER — Ambulatory Visit: Payer: PPO | Admitting: Family Medicine

## 2023-10-25 ENCOUNTER — Encounter: Payer: Self-pay | Admitting: Family Medicine

## 2023-10-25 ENCOUNTER — Ambulatory Visit (INDEPENDENT_AMBULATORY_CARE_PROVIDER_SITE_OTHER): Payer: PPO

## 2023-10-25 ENCOUNTER — Ambulatory Visit: Payer: PPO | Admitting: Family Medicine

## 2023-10-25 VITALS — BP 110/66 | HR 105 | Temp 98.4°F | Ht 67.0 in | Wt 135.8 lb

## 2023-10-25 DIAGNOSIS — J189 Pneumonia, unspecified organism: Secondary | ICD-10-CM | POA: Diagnosis not present

## 2023-10-25 DIAGNOSIS — J029 Acute pharyngitis, unspecified: Secondary | ICD-10-CM

## 2023-10-25 DIAGNOSIS — R059 Cough, unspecified: Secondary | ICD-10-CM | POA: Diagnosis not present

## 2023-10-25 LAB — POC COVID19 BINAXNOW: SARS Coronavirus 2 Ag: NEGATIVE

## 2023-10-25 LAB — POCT RAPID STREP A (OFFICE): Rapid Strep A Screen: NEGATIVE

## 2023-10-25 LAB — POCT INFLUENZA A/B
Influenza A, POC: NEGATIVE
Influenza B, POC: NEGATIVE

## 2023-10-25 MED ORDER — DOXYCYCLINE HYCLATE 100 MG PO TABS
100.0000 mg | ORAL_TABLET | Freq: Two times a day (BID) | ORAL | 0 refills | Status: DC
Start: 2023-10-25 — End: 2023-12-29

## 2023-10-25 MED ORDER — AMOXICILLIN-POT CLAVULANATE 875-125 MG PO TABS
1.0000 | ORAL_TABLET | Freq: Two times a day (BID) | ORAL | 0 refills | Status: DC
Start: 2023-10-25 — End: 2023-12-29

## 2023-10-25 NOTE — Assessment & Plan Note (Addendum)
Clinical diagnosis of pneumonia based on exam and history.  We will treat with doxycycline 100 mg twice daily for 7 days and Augmentin 1 tablet twice daily for 7 days.  Patient will follow-up next week for recheck.  If he has any worsening symptoms he will be reevaluated at urgent care or in the ED over the holiday weekend.  Chest x-ray to be completed today.  Lab work as outlined.  COVID, flu, and strep testing negative.

## 2023-10-25 NOTE — Patient Instructions (Addendum)
Nice to see you. We are going to treat you for pneumonia with Augmentin and doxycycline.  If you develop excessive diarrhea with these please let us know immediately.  Please make sure you stay out of the sun while you are on the doxycycline. We will contact you with your chest x-ray result. We will get lab work today as well.

## 2023-10-25 NOTE — Progress Notes (Signed)
Marikay Alar, MD Phone: [phone]  Rodney Branch is a 89 y.o. male who presents today for same-day visit.  Sore throat/cough: This has been going on 1.5-2 weeks.  He has had sore throat with discomfort in his throat when swallowing.  He said lots of cough with congestion in his chest.  He is had some chills over the last day or so and feels quite weak.  No shortness of breath.  Social History   Tobacco Use  Smoking Status Never  Smokeless Tobacco Never    Current Outpatient Medications on File Prior to Visit  Medication Sig Dispense Refill   Cholecalciferol (VITAMIN D) 50 MCG (2000 UT) CAPS Take 1 capsule by mouth daily.     doxazosin (CARDURA) 8 MG tablet Take 1 tablet (8 mg total) by mouth daily. 90 tablet 3   ferrous sulfate 325 (65 FE) MG tablet Take 325 mg by mouth daily with breakfast.     omeprazole (PRILOSEC) 20 MG capsule Take 1 capsule by mouth once daily 90 capsule 0   PARoxetine (PAXIL) 30 MG tablet Take 1 tablet (30 mg total) by mouth every morning. 90 tablet 0   polyethylene glycol powder (GLYCOLAX/MIRALAX) 17 GM/SCOOP powder Take 17 g by mouth daily as needed. 850 g 0   primidone (MYSOLINE) 50 MG tablet Take 3 tablets (150 mg total) by mouth at bedtime. 270 tablet 3   QUEtiapine (SEROQUEL) 300 MG tablet TAKE 2 TABLETS BY MOUTH AT BEDTIME 180 tablet 1   vitamin B-12 (CYANOCOBALAMIN) 1000 MCG tablet Take 1,000 mcg by mouth daily.     No current facility-administered medications on file prior to visit.     ROS see history of present illness  Objective  Physical Exam Vitals:   10/25/23 1505  BP: 110/66  Pulse: (!) 105  Temp: 98.4 F (36.9 C)  SpO2: 96%    BP Readings from Last 3 Encounters:  10/25/23 110/66  09/04/23 114/68  08/25/23 136/86   Wt Readings from Last 3 Encounters:  10/25/23 135 lb 12.8 oz (61.6 kg)  09/04/23 140 lb 6.4 oz (63.7 kg)  08/25/23 139 lb (63 kg)    Physical Exam Constitutional:      General: He is not in acute  distress.    Appearance: He is not diaphoretic.  HENT:     Mouth/Throat:     Mouth: Mucous membranes are moist.     Pharynx: Oropharynx is clear.  Cardiovascular:     Rate and Rhythm: Normal rate and regular rhythm.     Heart sounds: Normal heart sounds.  Pulmonary:     Effort: Pulmonary effort is normal.     Comments: Crackles in left lower lung field Lymphadenopathy:     Cervical: No cervical adenopathy.  Skin:    General: Skin is warm and dry.  Neurological:     Mental Status: He is alert.      Assessment/Plan: Please see individual problem list.  Pneumonia of left lower lobe due to infectious organism Assessment & Plan: Clinical diagnosis of pneumonia based on exam and history.  We will treat with doxycycline 100 mg twice daily for 7 days and Augmentin 1 tablet twice daily for 7 days.  Patient will follow-up next week for recheck.  If he has any worsening symptoms he will be reevaluated at urgent care or in the ED over the holiday weekend.  Chest x-ray to be completed today.  Lab work as outlined.  COVID, flu, and strep testing negative.  Orders: -  DG Chest 2 View; Future -     Comprehensive metabolic panel -     CBC with Differential/Platelet -     Doxycycline Hyclate; Take 1 tablet (100 mg total) by mouth 2 (two) times daily.  Dispense: 14 tablet; Refill: 0 -     Amoxicillin-Pot Clavulanate; Take 1 tablet by mouth 2 (two) times daily.  Dispense: 14 tablet; Refill: 0  Sore throat -     POC COVID-19 BinaxNow -     POCT Influenza A/B -     POCT rapid strep A    Return in about 1 week (around 11/01/2023) for recheck pneumonia.   Marikay Alar, MD [facility]

## 2023-10-26 LAB — COMPREHENSIVE METABOLIC PANEL
AG Ratio: 1.6 (calc) (ref 1.0–2.5)
ALT: 9 U/L (ref 9–46)
AST: 15 U/L (ref 10–35)
Albumin: 4.1 g/dL (ref 3.6–5.1)
Alkaline phosphatase (APISO): 121 U/L (ref 35–144)
BUN/Creatinine Ratio: 23 (calc) — ABNORMAL HIGH (ref 6–22)
BUN: 26 mg/dL — ABNORMAL HIGH (ref 7–25)
CO2: 23 mmol/L (ref 20–32)
Calcium: 9.1 mg/dL (ref 8.6–10.3)
Chloride: 105 mmol/L (ref 98–110)
Creat: 1.14 mg/dL (ref 0.70–1.22)
Globulin: 2.5 g/dL (ref 1.9–3.7)
Glucose, Bld: 122 mg/dL — ABNORMAL HIGH (ref 65–99)
Potassium: 4.6 mmol/L (ref 3.5–5.3)
Sodium: 139 mmol/L (ref 135–146)
Total Bilirubin: 0.8 mg/dL (ref 0.2–1.2)
Total Protein: 6.6 g/dL (ref 6.1–8.1)

## 2023-10-26 LAB — CBC WITH DIFFERENTIAL/PLATELET
Absolute Lymphocytes: 765 {cells}/uL — ABNORMAL LOW (ref 850–3900)
Absolute Monocytes: 756 {cells}/uL (ref 200–950)
Basophils Absolute: 18 {cells}/uL (ref 0–200)
Basophils Relative: 0.2 %
Eosinophils Absolute: 27 {cells}/uL (ref 15–500)
Eosinophils Relative: 0.3 %
HCT: 40 % (ref 38.5–50.0)
Hemoglobin: 13.2 g/dL (ref 13.2–17.1)
MCH: 29.8 pg (ref 27.0–33.0)
MCHC: 33 g/dL (ref 32.0–36.0)
MCV: 90.3 fL (ref 80.0–100.0)
MPV: 10.2 fL (ref 7.5–12.5)
Monocytes Relative: 8.4 %
Neutro Abs: 7434 {cells}/uL (ref 1500–7800)
Neutrophils Relative %: 82.6 %
Platelets: 291 10*3/uL (ref 140–400)
RBC: 4.43 10*6/uL (ref 4.20–5.80)
RDW: 12.9 % (ref 11.0–15.0)
Total Lymphocyte: 8.5 %
WBC: 9 10*3/uL (ref 3.8–10.8)

## 2023-10-30 ENCOUNTER — Telehealth: Payer: Self-pay | Admitting: Family Medicine

## 2023-10-30 NOTE — Telephone Encounter (Signed)
Pt spouse would like to be called regarding the pt

## 2023-10-31 ENCOUNTER — Emergency Department
Admission: EM | Admit: 2023-10-31 | Discharge: 2023-10-31 | Disposition: A | Discharge status: Home / Self Care | Payer: PPO | Attending: Emergency Medicine | Admitting: Emergency Medicine

## 2023-10-31 ENCOUNTER — Other Ambulatory Visit: Payer: Self-pay

## 2023-10-31 ENCOUNTER — Emergency Department: Payer: PPO

## 2023-10-31 DIAGNOSIS — R0602 Shortness of breath: Secondary | ICD-10-CM | POA: Diagnosis not present

## 2023-10-31 DIAGNOSIS — Z20822 Contact with and (suspected) exposure to covid-19: Secondary | ICD-10-CM | POA: Diagnosis not present

## 2023-10-31 DIAGNOSIS — R079 Chest pain, unspecified: Secondary | ICD-10-CM | POA: Diagnosis not present

## 2023-10-31 DIAGNOSIS — R059 Cough, unspecified: Secondary | ICD-10-CM | POA: Diagnosis not present

## 2023-10-31 DIAGNOSIS — R0789 Other chest pain: Secondary | ICD-10-CM | POA: Diagnosis not present

## 2023-10-31 LAB — BASIC METABOLIC PANEL
Anion gap: 11 (ref 5–15)
BUN: 33 mg/dL — ABNORMAL HIGH (ref 8–23)
CO2: 22 mmol/L (ref 22–32)
Calcium: 8.6 mg/dL — ABNORMAL LOW (ref 8.9–10.3)
Chloride: 104 mmol/L (ref 98–111)
Creatinine, Ser: 1.09 mg/dL (ref 0.61–1.24)
GFR, Estimated: >60 mL/min (ref >60)
Glucose, Bld: 93 mg/dL (ref 70–99)
Potassium: 3.7 mmol/L (ref 3.5–5.1)
Sodium: 137 mmol/L (ref 135–145)

## 2023-10-31 LAB — CBC
HCT: 38.1 % — ABNORMAL LOW (ref 39.0–52.0)
Hemoglobin: 12.5 g/dL — ABNORMAL LOW (ref 13.0–17.0)
MCH: 29.8 pg (ref 26.0–34.0)
MCHC: 32.8 g/dL (ref 30.0–36.0)
MCV: 90.9 fL (ref 80.0–100.0)
Platelets: 334 10*3/uL (ref 150–400)
RBC: 4.19 MIL/uL — ABNORMAL LOW (ref 4.22–5.81)
RDW: 13.9 % (ref 11.5–15.5)
WBC: 6 10*3/uL (ref 4.0–10.5)
nRBC: 0 % (ref 0.0–0.2)

## 2023-10-31 LAB — RESP PANEL BY RT-PCR (RSV, FLU A&B, COVID)  RVPGX2
Influenza A by PCR: NEGATIVE
Influenza B by PCR: NEGATIVE
Resp Syncytial Virus by PCR: NEGATIVE
SARS Coronavirus 2 by RT PCR: NEGATIVE

## 2023-10-31 LAB — TROPONIN I (HIGH SENSITIVITY): Troponin I (High Sensitivity): 9 ng/L (ref <18)

## 2023-10-31 LAB — GROUP A STREP BY PCR: Group A Strep by PCR: NOT DETECTED

## 2023-10-31 NOTE — ED Provider Notes (Signed)
[facility] Provider Note    Event Date/Time   First MD Initiated Contact with Patient 10/31/23 1654     (approximate)   History   Chest Pain and Shortness of Breath   HPI  Rodney Branch is a 89 y.o. male with a history of GERD, BPH, and anemia who presents with cough, shortness of breath, left-sided chest pain over the last several days.  The patient states he actually has been feeling completely fine today and all of the symptoms have resolved.  He was taking antibiotics over the weekend prescribed by his primary care provider.  When he was still having some symptoms last night, his wife called Dr. Birdie Sons this morning and he was recommended to come to the ED for further evaluation.  The patient denies any shortness of breath, chest pain, cough, fever, sore throat, or other acute symptoms at this time.  I reviewed the past medical records.  The patient was seen by Dr. Birdie Sons on 11/27 with sore throat and cough.  He was started on doxycycline and Augmentin for presumed pneumonia.   Physical Exam   Triage Vital Signs: ED Triage Vitals  Encounter Vitals Group     BP 10/31/23 1534 98/63     Systolic BP Percentile --      Diastolic BP Percentile --      Pulse Rate 10/31/23 1534 90     Resp 10/31/23 1534 18     Temp 10/31/23 1534 (!) 97.4 F (36.3 C)     Temp Source 10/31/23 1534 Oral     SpO2 10/31/23 1534 100 %     Weight 10/31/23 1532 134 lb 7.7 oz (61 kg)     Height 10/31/23 1532 5\' 7"  (1.702 m)     Head Circumference --      Peak Flow --      Pain Score 10/31/23 1532 0     Pain Loc --      Pain Education --      Exclude from Growth Chart --     Most recent vital signs: Vitals:   10/31/23 1534  BP: 98/63  Pulse: 90  Resp: 18  Temp: (!) 97.4 F (36.3 C)  SpO2: 100%     General: Alert, well-appearing, no distress.  CV:  Good peripheral perfusion.  Resp:  Normal effort.  Lungs CTAB. Abd:  Soft and nontender.  No distention.   Other:  No peripheral edema.   ED Results / Procedures / Treatments   Labs (all labs ordered are listed, but only abnormal results are displayed) Labs Reviewed  BASIC METABOLIC PANEL - Abnormal; Notable for the following components:      Result Value   BUN 33 (*)    Calcium 8.6 (*)    All other components within normal limits  CBC - Abnormal; Notable for the following components:   RBC 4.19 (*)    Hemoglobin 12.5 (*)    HCT 38.1 (*)    All other components within normal limits  GROUP A STREP BY PCR  RESP PANEL BY RT-PCR (RSV, FLU A&B, COVID)  [id number]  TROPONIN I (HIGH SENSITIVITY)     EKG  ED ECG REPORT I, Dionne Bucy, the attending physician, personally viewed and interpreted this ECG.  Date: 10/31/2023 EKG Time: 1540 Rate: 86 Rhythm: normal sinus rhythm QRS Axis: normal Intervals: normal ST/T Wave abnormalities: Nonspecific T wave abnormalities Narrative Interpretation: no evidence of acute ischemia    RADIOLOGY  Chest x-ray:  I independently viewed and interpreted the images; there is no focal consolidation or edema  PROCEDURES:  Critical Care performed: No  Procedures   MEDICATIONS ORDERED IN ED: Medications - No data to display   IMPRESSION / MDM / ASSESSMENT AND PLAN / ED COURSE  I reviewed the triage vital signs and the nursing notes.  89 year old male with PMH as noted above presents after he was referred by his PMD for cough, shortness of breath, chest pain over the last several days.  However, the patient states that he has been asymptomatic all day and states he feels fine now.  On exam he is very well-appearing.  He had a borderline low blood pressure in triage, but repeat in the room is 126/86.  Other vital signs are normal.  Physical exam is unremarkable for acute findings.  Chest x-ray shows no acute abnormalities.  EKG is nonischemic.  CBC shows no leukocytosis or anemia.  BMP is normal.  Troponin is negative.  Respiratory panel and  strep swab are negative.  Differential diagnosis includes, but is not limited to, resolved pneumonia, viral syndrome, musculoskeletal pain, GERD.  Given the negative workup and the fact that the symptoms have completely resolved, the patient is stable for discharge home.  The patient is very eager to go home.  I counseled him on the results of the workup.  I gave strict return precautions and he expressed understanding.  Patient's presentation is most consistent with acute complicated illness / injury requiring diagnostic workup.     FINAL CLINICAL IMPRESSION(S) / ED DIAGNOSES   Final diagnoses:  Shortness of breath  Atypical chest pain     Rx / DC Orders   ED Discharge Orders     None        Note:  This document was prepared using Dragon voice recognition software and may include unintentional dictation errors.    Dionne Bucy, MD 10/31/23 [phone]

## 2023-10-31 NOTE — Telephone Encounter (Signed)
Left message to call the office back regarding Dr. Purvis Sheffield recommendations below.

## 2023-10-31 NOTE — Telephone Encounter (Signed)
Called Patient's wife Rodney Branch and she states the Patient is still having chest pain, sore throat, not eating and SOB from walking from the kitchen to the living room in a town home. Patient will take last antibiotics today. Rodney Branch would like to know what to do for the Patient?

## 2023-10-31 NOTE — Telephone Encounter (Signed)
The patient's wife called stating her son is in a meeting and will not be able to take the patient to the hospital until after 5:00 or so.

## 2023-10-31 NOTE — Telephone Encounter (Signed)
Noted.  Given that constellation of symptoms he needs to go to the emergency department for evaluation today.  Please call them and advise them of this.

## 2023-10-31 NOTE — Discharge Instructions (Signed)
Follow-up with Dr. Birdie Sons.  Return to the ER for new, worsening, or persistent severe chest pain, difficulty breathing, fever, weakness, or any other new or worsening symptoms that concern you.

## 2023-10-31 NOTE — Telephone Encounter (Signed)
The wife stated they was going to the ED at 3:45 when the son could take them.

## 2023-10-31 NOTE — ED Triage Notes (Signed)
Pt presents to the ED POV from home with wife. Pt has been having SHOB, chest pain, loss of appetite, congestion, sore throat, and fatigue x3 days. Pt was scheduled to see his PCP today and his Dr was unable to see him. Pt denies pain at this time

## 2023-10-31 NOTE — Telephone Encounter (Signed)
Called Elease Hashimoto to recommend they go to the ED for evaluation and she states the Patient is napping so they will go around 3:45. Son is coming to take them to the ED.

## 2023-10-31 NOTE — Telephone Encounter (Signed)
[name] wife, Elease Hashimoto, called office returning Elizabeth's call. Elease Hashimoto stated the pt wanted to schedule an appt with Dr. Birdie Sons. Elease Hashimoto states she asked the pt was he feeling fine, pt stated he was going to take a nap & he'd be fine. Told patricia Dr. Purvis Sheffield order for pt to visit ER for eval. Elease Hashimoto states she'll try getting pt to the ER between today, 12/3 or tomorrow, 12/4. Call back # [phone]

## 2023-11-12 DIAGNOSIS — S41112A Laceration without foreign body of left upper arm, initial encounter: Secondary | ICD-10-CM | POA: Diagnosis not present

## 2023-11-16 DIAGNOSIS — J209 Acute bronchitis, unspecified: Secondary | ICD-10-CM | POA: Diagnosis not present

## 2023-11-16 DIAGNOSIS — R1314 Dysphagia, pharyngoesophageal phase: Secondary | ICD-10-CM | POA: Diagnosis not present

## 2023-11-23 ENCOUNTER — Other Ambulatory Visit: Payer: Self-pay | Admitting: Psychiatry

## 2023-11-30 ENCOUNTER — Encounter: Payer: PPO | Admitting: Nurse Practitioner

## 2023-12-04 ENCOUNTER — Encounter: Payer: PPO | Admitting: Nurse Practitioner

## 2023-12-05 ENCOUNTER — Encounter: Payer: Self-pay | Admitting: Psychiatry

## 2023-12-05 ENCOUNTER — Ambulatory Visit: Payer: PPO | Admitting: Psychiatry

## 2023-12-05 DIAGNOSIS — F5105 Insomnia due to other mental disorder: Secondary | ICD-10-CM | POA: Diagnosis not present

## 2023-12-05 DIAGNOSIS — F02B18 Dementia in other diseases classified elsewhere, moderate, with other behavioral disturbance: Secondary | ICD-10-CM | POA: Diagnosis not present

## 2023-12-05 DIAGNOSIS — G301 Alzheimer's disease with late onset: Secondary | ICD-10-CM

## 2023-12-05 DIAGNOSIS — F319 Bipolar disorder, unspecified: Secondary | ICD-10-CM | POA: Diagnosis not present

## 2023-12-05 DIAGNOSIS — G250 Essential tremor: Secondary | ICD-10-CM

## 2023-12-05 MED ORDER — PRIMIDONE 50 MG PO TABS: 100.0000 mg | ORAL_TABLET | Freq: Every day | ORAL | 1 refills | Status: DC

## 2023-12-05 MED ORDER — PAROXETINE HCL 30 MG PO TABS: 30.0000 mg | ORAL_TABLET | Freq: Every morning | ORAL | 1 refills | Status: DC
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
## 2023-12-13 ENCOUNTER — Ambulatory Visit: Payer: PPO | Admitting: Dermatology

## 2023-12-15 ENCOUNTER — Telehealth: Payer: Self-pay

## 2023-12-15 ENCOUNTER — Telehealth: Payer: Self-pay | Admitting: Family Medicine

## 2023-12-15 NOTE — Telephone Encounter (Signed)
Copied from CRM [id number]. Topic: Clinical - Medical Advice >> Dec 15, 2023  4:38 PM Suzette B wrote: Reason for CRM: Patient spouse is stating she wants more than the one FL2 she wants to be able to  have it in hand to take to multiple facilities. I explained that it was waiting for review and signature, but she states she doesn't want it faxed any where and wants to speak with Ms. Babs Sciara so she can explain in more detail what she is needing for her husband

## 2023-12-15 NOTE — Telephone Encounter (Signed)
Copied from CRM [id number]. Topic: General - Other >> Dec 15, 2023 10:17 AM Gurney Maxin H wrote: Reason for CRM: Patient's wife called in previously for a FS2 but she really needs a FL2 form didn't have her notes at original call. Needs form completed for husband to be on the waiting list for a Memory Unit. Please reach out to patients wife.   Elease Hashimoto [phone]

## 2023-12-15 NOTE — Telephone Encounter (Signed)
Copied from CRM [id number]. Topic: General - Other >> Dec 15, 2023 10:01 AM Turkey A wrote: Reason for CRM: Patient's wife called because she needs a FS2 form completed for husband to be on the waiting list for a Memory Unit- Mrs.Ferger would like a call back

## 2023-12-15 NOTE — Telephone Encounter (Signed)
Called and spoke to patient's wife Rodney Branch who is on [place]. I informed Rodney Branch that I was calling to get some information from her for the [facility] she is requesting to have for her husband. She stated that the [facility] was needed because Rodney Branch was going to be placed on a waiting list and was told that a FL2 was needed. I asked if she knew the name of the facility and she told me that she would have to give me a call back so that she can call her son who has the information. I asked the questions that needed to be answered. I informed her that I will be in the office until 5 PM today and if it is after 5PM that she can call the office and ask for [name], or [name], RN. She thanked me for calling and stated that hopefully that she will be able to give me a call back before 5 PM.

## 2023-12-15 NOTE — Telephone Encounter (Signed)
FL2 information was filled in and placed in provider box to review and sign.

## 2023-12-18 ENCOUNTER — Telehealth: Payer: Self-pay

## 2023-12-18 NOTE — Telephone Encounter (Signed)
Has this FL2 form been completed?

## 2023-12-18 NOTE — Telephone Encounter (Signed)
See previous message

## 2023-12-18 NOTE — Telephone Encounter (Signed)
Copied from CRM [id number]. Topic: General - Other >> Dec 18, 2023  9:35 AM Fredrich Romans wrote: Reason for CRM: patients wife called in to ask about the completion of FL2 form

## 2023-12-18 NOTE — Telephone Encounter (Signed)
This has been signed and placed in the signed folder.

## 2023-12-19 NOTE — Telephone Encounter (Signed)
Left message letting Patient's wife know that the paperwork is ready for pick up.

## 2023-12-21 ENCOUNTER — Inpatient Hospital Stay: Payer: PPO | Attending: Internal Medicine

## 2023-12-21 DIAGNOSIS — D649 Anemia, unspecified: Secondary | ICD-10-CM | POA: Insufficient documentation

## 2023-12-21 DIAGNOSIS — Z800 Family history of malignant neoplasm of digestive organs: Secondary | ICD-10-CM | POA: Insufficient documentation

## 2023-12-21 DIAGNOSIS — E538 Deficiency of other specified B group vitamins: Secondary | ICD-10-CM | POA: Diagnosis not present

## 2023-12-21 LAB — CMP (CANCER CENTER ONLY)
ALT: 16 U/L (ref 0–44)
AST: 17 U/L (ref 15–41)
Albumin: 4.1 g/dL (ref 3.5–5.0)
Alkaline Phosphatase: 99 U/L (ref 38–126)
Anion gap: 9 (ref 5–15)
BUN: 26 mg/dL — ABNORMAL HIGH (ref 8–23)
CO2: 24 mmol/L (ref 22–32)
Calcium: 9.1 mg/dL (ref 8.9–10.3)
Chloride: 108 mmol/L (ref 98–111)
Creatinine: 0.89 mg/dL (ref 0.61–1.24)
GFR, Estimated: >60 mL/min (ref >60)
Glucose, Bld: 103 mg/dL — ABNORMAL HIGH (ref 70–99)
Potassium: 3.8 mmol/L (ref 3.5–5.1)
Sodium: 141 mmol/L (ref 135–145)
Total Bilirubin: 0.5 mg/dL (ref 0.0–1.2)
Total Protein: 6.7 g/dL (ref 6.5–8.1)

## 2023-12-21 LAB — CBC WITH DIFFERENTIAL (CANCER CENTER ONLY)
Abs Immature Granulocytes: 0.02 10*3/uL (ref 0.00–0.07)
Basophils Absolute: 0 10*3/uL (ref 0.0–0.1)
Basophils Relative: 1 %
Eosinophils Absolute: 0.1 10*3/uL (ref 0.0–0.5)
Eosinophils Relative: 3 %
HCT: 37.9 % — ABNORMAL LOW (ref 39.0–52.0)
Hemoglobin: 12.2 g/dL — ABNORMAL LOW (ref 13.0–17.0)
Immature Granulocytes: 1 %
Lymphocytes Relative: 28 %
Lymphs Abs: 1 10*3/uL (ref 0.7–4.0)
MCH: 29.8 pg (ref 26.0–34.0)
MCHC: 32.2 g/dL (ref 30.0–36.0)
MCV: 92.7 fL (ref 80.0–100.0)
Monocytes Absolute: 0.3 10*3/uL (ref 0.1–1.0)
Monocytes Relative: 10 %
Neutro Abs: 2 10*3/uL (ref 1.7–7.7)
Neutrophils Relative %: 57 %
Platelet Count: 282 10*3/uL (ref 150–400)
RBC: 4.09 MIL/uL — ABNORMAL LOW (ref 4.22–5.81)
RDW: 15 % (ref 11.5–15.5)
WBC Count: 3.5 10*3/uL — ABNORMAL LOW (ref 4.0–10.5)
nRBC: 0 % (ref 0.0–0.2)

## 2023-12-21 LAB — FERRITIN: Ferritin: 27 ng/mL (ref 24–336)

## 2023-12-21 LAB — IRON AND TIBC
Iron: 70 ug/dL (ref 45–182)
Saturation Ratios: 25 % (ref 17.9–39.5)
TIBC: 284 ug/dL (ref 250–450)
UIBC: 214 ug/dL

## 2023-12-21 LAB — LACTATE DEHYDROGENASE: LDH: 134 U/L (ref 98–192)

## 2023-12-21 LAB — VITAMIN B12: Vitamin B-12: 414 pg/mL (ref 180–914)

## 2023-12-22 LAB — HAPTOGLOBIN: Haptoglobin: 201 mg/dL (ref 38–329)

## 2023-12-27 ENCOUNTER — Ambulatory Visit: Payer: PPO | Admitting: Dermatology

## 2023-12-27 DIAGNOSIS — Z85828 Personal history of other malignant neoplasm of skin: Secondary | ICD-10-CM

## 2023-12-27 DIAGNOSIS — D225 Melanocytic nevi of trunk: Secondary | ICD-10-CM | POA: Diagnosis not present

## 2023-12-27 DIAGNOSIS — Z86018 Personal history of other benign neoplasm: Secondary | ICD-10-CM

## 2023-12-27 DIAGNOSIS — L821 Other seborrheic keratosis: Secondary | ICD-10-CM | POA: Diagnosis not present

## 2023-12-27 DIAGNOSIS — L578 Other skin changes due to chronic exposure to nonionizing radiation: Secondary | ICD-10-CM | POA: Diagnosis not present

## 2023-12-27 DIAGNOSIS — D1801 Hemangioma of skin and subcutaneous tissue: Secondary | ICD-10-CM

## 2023-12-27 DIAGNOSIS — D229 Melanocytic nevi, unspecified: Secondary | ICD-10-CM | POA: Diagnosis not present

## 2023-12-27 DIAGNOSIS — L814 Other melanin hyperpigmentation: Secondary | ICD-10-CM

## 2023-12-27 DIAGNOSIS — W908XXA Exposure to other nonionizing radiation, initial encounter: Secondary | ICD-10-CM | POA: Diagnosis not present

## 2023-12-27 DIAGNOSIS — T1490XD Injury, unspecified, subsequent encounter: Secondary | ICD-10-CM

## 2023-12-27 DIAGNOSIS — Q825 Congenital non-neoplastic nevus: Secondary | ICD-10-CM

## 2023-12-27 DIAGNOSIS — S50912A Unspecified superficial injury of left forearm, initial encounter: Secondary | ICD-10-CM

## 2023-12-27 DIAGNOSIS — Z1283 Encounter for screening for malignant neoplasm of skin: Secondary | ICD-10-CM

## 2023-12-27 DIAGNOSIS — Z85820 Personal history of malignant melanoma of skin: Secondary | ICD-10-CM

## 2023-12-27 NOTE — Progress Notes (Signed)
Follow-Up Visit   Subjective  Rodney Branch is a 89 y.o. male who presents for the following: Skin Cancer Screening and Full Body Skin Exam  The patient presents for Total-Body Skin Exam (TBSE) for skin cancer screening and mole check. The patient has spots, moles and lesions to be evaluated, some may be new or changing and the patient may have concern these could be cancer.  The following portions of the chart were reviewed this encounter and updated as appropriate: medications, allergies, medical history  Review of Systems:  No other skin or systemic complaints except as noted in HPI or Assessment and Plan.  Objective  Well appearing patient in no apparent distress; mood and affect are within normal limits.  A full examination was performed including scalp, head, eyes, ears, nose, lips, neck, chest, axillae, abdomen, back, buttocks, bilateral upper extremities, bilateral lower extremities, hands, feet, fingers, toes, fingernails, and toenails. All findings within normal limits unless otherwise noted below.   Relevant physical exam findings are noted in the Assessment and Plan.        Assessment & Plan   SKIN CANCER SCREENING PERFORMED TODAY.  ACTINIC DAMAGE - Chronic condition, secondary to cumulative UV/sun exposure - diffuse scaly erythematous macules with underlying dyspigmentation - Recommend daily broad spectrum sunscreen SPF 30+ to sun-exposed areas, reapply every 2 hours as needed.  - Staying in the shade or wearing long sleeves, sun glasses (UVA+UVB protection) and wide brim hats (4-inch brim around the entire circumference of the hat) are also recommended for sun protection.  - Call for new or changing lesions.  LENTIGINES, SEBORRHEIC KERATOSES, HEMANGIOMAS - Benign normal skin lesions - Benign-appearing - Call for any changes  MELANOCYTIC NEVI - R lower back 0.4 cm speckled medium dark brown macule - Tan-brown and/or pink-flesh-colored symmetric macules and  papules - Benign appearing on exam today - Observation - Call clinic for new or changing moles - Recommend daily use of broad spectrum spf 30+ sunscreen to sun-exposed areas.   HISTORY OF MELANOMA  R abdomen - Treated by Dr. Park Liter in 2015 R upper back - 09/07/2021 Malignant melanoma - invasive Breslow 1.6 mm, WLE at [facility], SLN not performed - No evidence of recurrence today - No lymphadenopathy - Recommend regular full body skin exams, continue close monitoring q58mos - Recommend daily broad spectrum sunscreen SPF 30+ to sun-exposed areas, reapply every 2 hours as needed.  - Call if any new or changing lesions are noted between office visits  HISTORY OF DYSPLASTIC NEVI - multiple  No evidence of recurrence today Recommend regular full body skin exams Recommend daily broad spectrum sunscreen SPF 30+ to sun-exposed areas, reapply every 2 hours as needed.  Call if any new or changing lesions are noted between office visits  HISTORY OF BASAL CELL CARCINOMA OF THE SKIN - R ear posterior, excised 01/31/2023 - No evidence of recurrence today - Recommend regular full body skin exams - Recommend daily broad spectrum sunscreen SPF 30+ to sun-exposed areas, reapply every 2 hours as needed.  - Call if any new or changing lesions are noted between office visits  Healing wound (from traumatic injury per pt- improving)   Exam: 1.0 cm firm pink nodule on the L mid forearm    - Benign-appearing.  Observation.  Call clinic for new or changing lesions.  Recommend daily use of broad spectrum spf 30+ sunscreen to sun-exposed areas.   RTC if any changes or not healing  Congenital nevus 2.0 x 1.0 cm  tan plaque L lower flank  - Benign-appearing.  Observation.  Call clinic for new or changing lesions.  Recommend daily use of broad spectrum spf 30+ sunscreen to sun-exposed areas.   Return in about 3 months (around 03/26/2024) for TBSE.  Maylene Roes, CMA, am acting as scribe for Willeen Niece, MD  .  Documentation: I have reviewed the above documentation for accuracy and completeness, and I agree with the above.  Willeen Niece, MD

## 2023-12-27 NOTE — Patient Instructions (Signed)

## 2023-12-29 ENCOUNTER — Encounter: Payer: Self-pay | Admitting: Internal Medicine

## 2023-12-29 ENCOUNTER — Inpatient Hospital Stay: Payer: PPO | Admitting: Internal Medicine

## 2023-12-29 VITALS — BP 90/68 | HR 91 | Temp 97.9°F | Ht 67.0 in | Wt 132.2 lb

## 2023-12-29 DIAGNOSIS — D649 Anemia, unspecified: Secondary | ICD-10-CM

## 2023-12-29 NOTE — Assessment & Plan Note (Addendum)
#[  2017] chronic MILD leukopenia/intermittent neutropenia; hemoglobin-mildly low at 12.  Again chronic.  Normal platelets.  September 2024 white count normal hemoglobin 11; normocytic.  Hepatitis panel negative Kappa lambda light chain ratio M protein negative.  Ferritin 28 saturation 51.  LDH normal.-Haptoglobin- wnl.   [place] SEP 2024- Spleen is normal in size.    #.  Hemoglobin 12.2 white count 3.5 normal differential.  Platelets normal-given the mild anemia/chronic-I would not recommend any further workup like bone marrow biopsy unless hemoglobin trends down.  # B12 deficiency: recommend B12 1000 mcg/day- PO; recommend Iron every other day.   # DISPOSITION:  # follow up in 6  months - MD:  labs- cbc/cmp; LDH; iron studies; ferritin- B12 levels- Dr.B

## 2023-12-29 NOTE — Addendum Note (Signed)
Addended by: Clydia Llano on: 12/29/2023 01:32 PM   Modules accepted: Orders
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
## 2023-12-29 NOTE — Progress Notes (Signed)
Fatigue/weakness: no Dyspena: no Light headedness: no Blood in stool: no  Seen  in ED 10/31/23 for SOB and chest pain.  C/o mucus in throat, can't get rid of it x2 months.

## 2024-01-26 ENCOUNTER — Ambulatory Visit: Payer: PPO | Admitting: Nurse Practitioner

## 2024-01-26 ENCOUNTER — Encounter: Payer: Self-pay | Admitting: Nurse Practitioner

## 2024-01-26 VITALS — BP 122/72 | HR 96 | Ht 67.0 in | Wt 134.4 lb

## 2024-01-26 DIAGNOSIS — R1310 Dysphagia, unspecified: Secondary | ICD-10-CM

## 2024-01-26 NOTE — Assessment & Plan Note (Addendum)
 Patient reports intermittent pain and difficulty swallowing, particularly with cold liquids and certain food textures like meat chicken and cereal.  -Refer to Gastroenterology for further evaluation and management -Order swallow study to assess swallowing mechanics and identify potential causes of dysphagia. -Advise patient to take small bites, chew thoroughly, and drink water with meals.
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
## 2024-02-01 ENCOUNTER — Other Ambulatory Visit: Payer: Self-pay | Admitting: Psychiatry

## 2024-02-01 DIAGNOSIS — G250 Essential tremor: Secondary | ICD-10-CM

## 2024-02-01 DIAGNOSIS — F5105 Insomnia due to other mental disorder: Secondary | ICD-10-CM

## 2024-02-01 DIAGNOSIS — F319 Bipolar disorder, unspecified: Secondary | ICD-10-CM

## 2024-02-07 ENCOUNTER — Ambulatory Visit: Payer: PPO | Admitting: Psychiatry

## 2024-03-27 ENCOUNTER — Ambulatory Visit: Admitting: Family Medicine

## 2024-04-16 ENCOUNTER — Ambulatory Visit: Payer: PPO | Admitting: Dermatology

## 2024-05-12 ENCOUNTER — Other Ambulatory Visit: Payer: Self-pay | Admitting: Psychiatry

## 2024-05-12 DIAGNOSIS — F319 Bipolar disorder, unspecified: Secondary | ICD-10-CM

## 2024-05-12 DIAGNOSIS — F5105 Insomnia due to other mental disorder: Secondary | ICD-10-CM

## 2024-05-17 ENCOUNTER — Emergency Department
Admission: EM | Admit: 2024-05-17 | Discharge: 2024-05-18 | Disposition: A | Discharge status: Home / Self Care | Attending: Emergency Medicine | Admitting: Emergency Medicine

## 2024-05-17 ENCOUNTER — Other Ambulatory Visit: Payer: Self-pay

## 2024-05-17 DIAGNOSIS — W01198A Fall on same level from slipping, tripping and stumbling with subsequent striking against other object, initial encounter: Secondary | ICD-10-CM | POA: Insufficient documentation

## 2024-05-17 DIAGNOSIS — R9082 White matter disease, unspecified: Secondary | ICD-10-CM | POA: Diagnosis not present

## 2024-05-17 DIAGNOSIS — S065X0A Traumatic subdural hemorrhage without loss of consciousness, initial encounter: Secondary | ICD-10-CM | POA: Diagnosis not present

## 2024-05-17 DIAGNOSIS — S199XXA Unspecified injury of neck, initial encounter: Secondary | ICD-10-CM | POA: Diagnosis not present

## 2024-05-17 DIAGNOSIS — S065XAA Traumatic subdural hemorrhage with loss of consciousness status unknown, initial encounter: Secondary | ICD-10-CM | POA: Diagnosis not present

## 2024-05-17 DIAGNOSIS — S0181XA Laceration without foreign body of other part of head, initial encounter: Secondary | ICD-10-CM | POA: Insufficient documentation

## 2024-05-17 DIAGNOSIS — S0990XA Unspecified injury of head, initial encounter: Secondary | ICD-10-CM | POA: Diagnosis not present

## 2024-05-17 DIAGNOSIS — S0101XA Laceration without foreign body of scalp, initial encounter: Secondary | ICD-10-CM | POA: Diagnosis not present

## 2024-05-17 DIAGNOSIS — W19XXXA Unspecified fall, initial encounter: Secondary | ICD-10-CM

## 2024-05-17 DIAGNOSIS — R9089 Other abnormal findings on diagnostic imaging of central nervous system: Secondary | ICD-10-CM | POA: Diagnosis not present

## 2024-05-17 NOTE — ED Triage Notes (Signed)
 BIB EMS for an unwitnessed mechanical fall. The patient is not on blood thinners and did not lose consciousness. EMS states he was walking down his hallway at home when he tripped an fell. Approximately 1 inch laceration to the left forehead. C/O a headache.

## 2024-05-18 ENCOUNTER — Emergency Department

## 2024-05-18 DIAGNOSIS — S199XXA Unspecified injury of neck, initial encounter: Secondary | ICD-10-CM | POA: Diagnosis not present

## 2024-05-18 DIAGNOSIS — R9082 White matter disease, unspecified: Secondary | ICD-10-CM | POA: Diagnosis not present

## 2024-05-18 DIAGNOSIS — S0181XA Laceration without foreign body of other part of head, initial encounter: Secondary | ICD-10-CM | POA: Diagnosis not present

## 2024-05-18 DIAGNOSIS — W19XXXA Unspecified fall, initial encounter: Secondary | ICD-10-CM

## 2024-05-18 DIAGNOSIS — S0101XA Laceration without foreign body of scalp, initial encounter: Secondary | ICD-10-CM | POA: Diagnosis not present

## 2024-05-18 DIAGNOSIS — R519 Headache, unspecified: Secondary | ICD-10-CM | POA: Diagnosis not present

## 2024-05-18 DIAGNOSIS — S065X0A Traumatic subdural hemorrhage without loss of consciousness, initial encounter: Secondary | ICD-10-CM | POA: Diagnosis not present

## 2024-05-18 DIAGNOSIS — S0990XA Unspecified injury of head, initial encounter: Secondary | ICD-10-CM | POA: Diagnosis not present

## 2024-05-18 DIAGNOSIS — S065XAA Traumatic subdural hemorrhage with loss of consciousness status unknown, initial encounter: Secondary | ICD-10-CM | POA: Diagnosis not present

## 2024-05-18 DIAGNOSIS — R9089 Other abnormal findings on diagnostic imaging of central nervous system: Secondary | ICD-10-CM | POA: Diagnosis not present

## 2024-05-18 MED ORDER — LIDOCAINE-EPINEPHRINE 1 %-1:100000 IJ SOLN
20.0000 mL | Freq: Once | INTRAMUSCULAR | Status: DC
  Filled 2024-05-18: qty 20
  Filled 2024-05-18: qty 1

## 2024-05-18 NOTE — ED Notes (Signed)
 Patient Alert and oriented to baseline. Stable and ambulatory to baseline. Patient verbalized understanding of the discharge instructions.  Patient belongings were taken by the patient.

## 2024-05-18 NOTE — ED Provider Notes (Addendum)
 [facility] Provider Note    Event Date/Time   First MD Initiated Contact with Patient 05/17/24 2342     (approximate)   History   Fall   HPI  Rodney Branch is a 89 y.o. male   Past medical history of hyperlipidemia and some memory difficulties here with a mechanical slip and fall when he misplaced the step, fell and hit his head causing a laceration to left side of forehead.  He denies blood thinner use.  He denies loss of consciousness.  He denies any other injuries from his fall.    He has no other acute medical complaints, denies recent medical illnesses.     External Medical Documents Reviewed: Outpatient notes from earlier this year documented past medical history      Physical Exam   Triage Vital Signs: ED Triage Vitals  Encounter Vitals Group     BP 05/17/24 2327 (!) 141/78     Girls Systolic BP Percentile --      Girls Diastolic BP Percentile --      Boys Systolic BP Percentile --      Boys Diastolic BP Percentile --      Pulse Rate 05/17/24 2327 81     Resp 05/17/24 2327 16     Temp 05/17/24 2327 97.8 F (36.6 C)     Temp Source 05/17/24 2327 Oral     SpO2 05/17/24 2327 98 %     Weight 05/17/24 2330 134 lb 7.7 oz (61 kg)     Height 05/17/24 2330 5' 7 (1.702 m)     Head Circumference --      Peak Flow --      Pain Score 05/17/24 2328 6     Pain Loc --      Pain Education --      Exclude from Growth Chart --     Most recent vital signs: Vitals:   05/17/24 2327 05/18/24 0000  BP: (!) 141/78 136/77  Pulse: 81 82  Resp: 16 16  Temp: 97.8 F (36.6 C)   SpO2: 98% 98%    General: Awake, no distress.  CV:  Good peripheral perfusion.  Resp:  Normal effort.  Abd:  No distention. Other:  Awake pleasant gentleman in no acute distress.  Laceration to the left side of his forehead approximately 3 cm, hemostatic.  Answering questions appropriately, awake and appropriate.  Moving all extremities full active range of motion.  No  deformity to the CT or L-spine no tenderness palpation there or the chest wall, abdomen, and bony prominences of extremities.   ED Results / Procedures / Treatments   Labs (all labs ordered are listed, but only abnormal results are displayed) Labs Reviewed - No data to display     RADIOLOGY I independently reviewed and interpreted CT of the head and see a subdural along the left side without midline shift I also reviewed radiologist's formal read.   PROCEDURES:  Critical Care performed: Yes, see critical care procedure note(s)  .Critical Care  Performed by: Cyrena Mylar, MD Authorized by: Cyrena Mylar, MD   Critical care provider statement:    Critical care time (minutes):  30   Critical care was time spent personally by me on the following activities:  Development of treatment plan with patient or surrogate, discussions with consultants, evaluation of patient's response to treatment, examination of patient, ordering and review of laboratory studies, ordering and review of radiographic studies, ordering and performing treatments and interventions,  pulse oximetry, re-evaluation of patient's condition and review of old charts .Laceration Repair  Date/Time: 05/18/2024 6:59 AM  Performed by: Cyrena Mylar, MD Authorized by: Cyrena Mylar, MD   Consent:    Consent obtained:  Verbal   Risks discussed:  Pain, need for additional repair, infection and poor cosmetic result   Alternatives discussed:  No treatment and delayed treatment Universal protocol:    Procedure explained and questions answered to patient or proxy's satisfaction: yes     Patient identity confirmed:  Verbally with patient Anesthesia:    Anesthesia method:  Local infiltration   Local anesthetic:  Lidocaine  1% WITH epi Laceration details:    Location:  Face   Face location:  Forehead   Length (cm):  3   Depth (mm):  2 Exploration:    Wound extent: no foreign body   Treatment:    Area cleansed with:   Povidone-iodine   Amount of cleaning:  Standard   Irrigation solution:  Sterile water   Irrigation method:  Syringe   Debridement:  None Skin repair:    Repair method:  Sutures   Suture size:  5-0   Suture material:  Fast-absorbing gut   Suture technique:  Running   Number of sutures:  5 Approximation:    Approximation:  Close Repair type:    Repair type:  Simple Post-procedure details:    Dressing:  Open (no dressing)   Procedure completion:  Tolerated    MEDICATIONS ORDERED IN ED: Medications  lidocaine -EPINEPHrine  (XYLOCAINE  W/EPI) 1 %-1:100000 (with pres) injection 20 mL (has no administration in time range)    External physician / consultants:  I spoke with Dr. Penne Sharps of neurosurgery regarding care plan for this patient.   IMPRESSION / MDM / ASSESSMENT AND PLAN / ED COURSE  I reviewed the triage vital signs and the nursing notes.                                Patient's presentation is most consistent with acute presentation with potential threat to life or bodily function.  Differential diagnosis includes, but is not limited to, skull fracture ICH, laceration, considered syncope or medical cause of his fall but unlikely given his very clear mechanical nature description of fall  The patient is on the cardiac monitor to evaluate for evidence of arrhythmia and/or significant heart rate changes.  MDM:    Is a patient with a mechanical slip and fall and head strike elderly at 89 with a laceration to the left forehead and evidence of a subdural hematoma on imaging.  Fortunately neuro intact mentation appropriate, on multiple checks throughout his stay, laceration repaired as above and the subdural hematoma was discussed with Dr. Penne Sharps of neurosurgery and plan is for repeat 6-hour CT scan and reconsultation with neurosurgery for disposition.   -- Repeat scan with some very small changes, reviewed by neurosurgery Dr. Penne Sharps who suggest another  interval CT scan.  Patient reassessed at this time and remains neuro intact and stable, I woke him from a night sleep and he is a little bit amnestic about the events from last night, though he has a history of memory difficulties as well.  He is able to move all extremities and converse with me fully.  Signed out to morning provider pending repeat stability scan and neurosurgery consultation for disposition.       FINAL CLINICAL IMPRESSION(S) / ED DIAGNOSES  Final diagnoses:  Fall, initial encounter  Subdural hematoma (HCC)  Laceration of forehead, initial encounter     Rx / DC Orders   ED Discharge Orders     None        Note:  This document was prepared using Dragon voice recognition software and may include unintentional dictation errors.    Cyrena Mylar, MD 05/18/24 9298    Cyrena Mylar, MD 05/18/24 [phone]

## 2024-05-18 NOTE — Discharge Instructions (Addendum)
 Call Dr. Penne Sharps of neurosurgery for follow-up appointment.  Your stitches should fall out within 5 to 7 days and if you notice that they have not dissolved yet after approximately 7 days, come back to the Emergency Department or see your doctor for suture removal.  Please keep your wound clean by washing at least daily with soap and water.  Apply antibiotic ointment and bandage. If you see any signs of infection like spreading redness, pus coming from the wound, extreme pain, fevers, chills or any other worsening doctor right away or come back to the emergency department   Thank you for choosing us  for your health care today!  Please see your primary doctor this week for a follow up appointment.   If you have any new, worsening, or unexpected symptoms call your doctor right away or come back to the emergency department for reevaluation.  It was my pleasure to care for you today.   Ginnie EDISON Cyrena, MD

## 2024-05-18 NOTE — Consult Note (Signed)
 Consulting Department:  Emergency department  Primary Physician:  No primary care provider on file.  Chief Complaint: Head trauma  History of Present Illness: 05/18/2024 Rodney Branch is a 89 y.o. male who presents with the chief complaint of head trauma.  He has a history of hypertension and early dementia had a mechanical fall hit his head and got a laceration on his forehead.  Came into the emergency department.  Not on any blood thinners.  Had no seizures.  States that he is not having any new weakness numbness or tingling.  He does have a headache.   Review of Systems:  A 10 point review of systems is negative, except for the pertinent positives and negatives detailed in the HPI.  Past Medical History: Past Medical History:  Diagnosis Date   Actinic keratosis 01/21/2016   R distal lat deltoid - bx proven   Arthritis    Basal cell carcinoma 12/28/2022   R ear posterior - Excised 01/31/23   Benign prostatic hypertrophy 1996   Depression 1996   Psych Admit ([place] ) 1996   Dysplastic nevus 09/16/2015   LUQA - moderate   Dysplastic nevus 09/16/2015   R side inf costal area - mod   Dysplastic nevus 02/15/2018   L upper back 4.0 cm lat to spine - mod   Dysplastic nevus 02/15/2018   R post flank sup - mod   Dysplastic nevus 02/15/2018   R post flank inf - mod to severe - excision 05/15/2018   Dysplastic nevus 02/15/2018   L post waistline/sacral - mod   Dysplastic nevus 09/12/2018   R lat periumbilical - mod   Dysplastic nevus 12/14/2021   supra umbilical, mod-sev, margins clear   GERD (gastroesophageal reflux disease)    INSOMNIA, CHRONIC 05/20/2009   STOPPED AMBIEN  2/2 ABUSE   Malignant melanoma (HCC) 2015   R abd (Albertini)   Malignant melanoma (HCC) 09/01/2021   right upper back   Psychiatric hospitalization 12/1994, 11/2014   self referral/anxiety depression, IVC for irrational behavior   Tremor, essential     Past Surgical History: Past Surgical History:   Procedure Laterality Date   CATARACT EXTRACTION  2003   Dr. Geraldene   DUPUYTREN CONTRACTURE RELEASE  01/2012   Dr Jackye   TOE SURGERY  06/2023    Allergies: Allergies as of 05/17/2024 - Review Complete 05/17/2024  Allergen Reaction Noted   Metoprolol tartrate Other (See Comments) 11/11/2014   Propranolol  10/18/2011    Medications:  Current Facility-Administered Medications:    lidocaine -EPINEPHrine  (XYLOCAINE  W/EPI) 1 %-1:100000 (with pres) injection 20 mL, 20 mL, Other, Once, Cyrena Mylar, MD  Current Outpatient Medications:    QUEtiapine  (SEROQUEL ) 300 MG tablet, TAKE 2 TABLETS BY MOUTH AT BEDTIME, Disp: 120 tablet, Rfl: 0   Cholecalciferol (VITAMIN D) 50 MCG (2000 UT) CAPS, Take 1 capsule by mouth daily., Disp: , Rfl:    doxazosin  (CARDURA ) 8 MG tablet, Take 1 tablet (8 mg total) by mouth daily., Disp: 90 tablet, Rfl: 3   ferrous sulfate 325 (65 FE) MG tablet, Take 325 mg by mouth daily with breakfast., Disp: , Rfl:    omeprazole  (PRILOSEC) 20 MG capsule, Take 1 capsule by mouth once daily, Disp: 90 capsule, Rfl: 0   PARoxetine  (PAXIL ) 30 MG tablet, Take 1 tablet (30 mg total) by mouth every morning., Disp: 90 tablet, Rfl: 1   polyethylene glycol powder (GLYCOLAX /MIRALAX ) 17 GM/SCOOP powder, Take 17 g by mouth daily as needed., Disp: 850 g, Rfl:  0   primidone  (MYSOLINE ) 50 MG tablet, TAKE 3 TABLETS BY MOUTH AT BEDTIME, Disp: 270 tablet, Rfl: 0   vitamin B-12 (CYANOCOBALAMIN ) 1000 MCG tablet, Take 1,000 mcg by mouth daily., Disp: , Rfl:    Social History: Social History   Tobacco Use   Smoking status: Never   Smokeless tobacco: Never  Vaping Use   Vaping status: Never Used  Substance Use Topics   Alcohol use: Not Currently    Alcohol/week: 0.0 standard drinks of alcohol   Drug use: No    Family Medical History: Family History  Problem Relation Age of Onset   Depression Mother 37       alzheimers//long history   Alzheimer's disease Mother    Cancer Father 69        cancer of stomach   Alcohol abuse Neg Hx    Diabetes Neg Hx    Stroke Neg Hx    CAD Neg Hx     Physical Examination: Vitals:   05/18/24 0000 05/18/24 0741  BP: 136/77 (!) 156/89  Pulse: 82 77  Resp: 16 16  Temp:  98.1 F (36.7 C)  SpO2: 98% 100%     General: Patient is well developed, well nourished, calm, collected, and in no apparent distress.  NEUROLOGICAL:  General: In no acute distress.   He awakens easily, he is oriented to person, place, and year..  Pupils equal round and reactive to light.  Full Facial tone is symmetric.  He does have a repaired laceration on his left forehead.  Tongue protrusion is midline.  Following commands in all 4 extremities, no clear deficit.  Has some difficulty understanding pronator drift testing but does not appear to be weak.  GCS: 14, awakens to voice.  Imaging: CT Head Wo Contrast Result Date: 05/18/2024 EXAM: CT HEAD WITHOUT CONTRAST 05/18/2024 93:71:77 AM TECHNIQUE: CT of the head was performed without the administration of intravenous contrast. Automated exposure control, iterative reconstruction, and/or weight based adjustment of the mA/kV was utilized to reduce the radiation dose to as low as reasonably achievable. COMPARISON: CT head without contrast 05/18/2024 at 12:29 am. CLINICAL HISTORY: Follow-up subdural hematoma. BIB EMS for an unwitnessed mechanical fall. The patient is not on blood thinners and did not lose consciousness. EMS states he was walking down his hallway at home when he tripped and fell. Approximately 1 inch laceration to the left forehead. Complains of a headache. FINDINGS: BRAIN AND VENTRICLES: The extraaxial collection over the left frontal convexity is slightly larger now measuring 7 mm on coronal images at the foramen of Monro compared with 4 mm previously. Midline shift is stable to slightly increased at 5 mm. No new hemorrhage is present. Advanced atrophy and white matter disease are stable. ORBITS: Bilateral  lens replacements are noted. The globes and orbits are otherwise within normal limits. SINUSES: No acute abnormality. SOFT TISSUES AND SKULL: Left frontotemporal scalp laceration is present without underlying fracture. IMPRESSION: 1. Slightly increased left frontal convexity extraaxial collection, now measuring 7 mm compared to 4 mm previously. 2. Stable to slightly increased midline shift at 5 mm. 3. No new hemorrhage. Findings were called to Dr. Cyrena at 07:16 am Electronically signed by: Lonni Necessary MD 05/18/2024 07:16 AM EDT RP Workstation: [id number]   CT Head Wo Contrast Result Date: 05/18/2024 CLINICAL DATA:  Head trauma, moderate-severe; Neck trauma (Age >= 65y) EXAM: CT HEAD WITHOUT CONTRAST CT CERVICAL SPINE WITHOUT CONTRAST TECHNIQUE: Multidetector CT imaging of the head and cervical spine was performed following  the standard protocol without intravenous contrast. Multiplanar CT image reconstructions of the cervical spine were also generated. RADIATION DOSE REDUCTION: This exam was performed according to the departmental dose-optimization program which includes automated exposure control, adjustment of the mA and/or kV according to patient size and/or use of iterative reconstruction technique. COMPARISON:  None Available. FINDINGS: CT HEAD FINDINGS Brain: Low-attenuation crescentic extra-axial fluid collection is seen along the left frontal convexity in keeping with a chronic subdural hematoma or cystic hygroma measuring up to 5 mm in thickness. Mild mass effect the left cerebral hemisphere. No resultant midline shift. No acute intracranial hemorrhage or infarct. Moderate global parenchymal volume loss is commensurate with the patient's age. No abnormal intra or extra-axial mass lesion. Ventricular size is normal and commensurate with the degree of parenchymal volume loss. Cerebellum is. Vascular: No hyperdense vessel or unexpected calcification. Skull: Normal. Negative for fracture or focal  lesion. Sinuses/Orbits: No acute finding. Other: Mastoid air cells and middle ear cavities are clear. Left frontal scalp laceration noted focally approaching the calvarial periosteum. CT CERVICAL SPINE FINDINGS Alignment: Normal. Skull base and vertebrae: No acute fracture. No primary bone lesion or focal pathologic process. Soft tissues and spinal canal: No prevertebral fluid or swelling. No visible canal hematoma. Disc levels: Disc space narrowing and endplate remodeling at C5-C7 is present keeping with changes mild to moderate degenerative disc disease. Prevertebral soft tissues are not thickened on sagittal reformats. Multilevel facet arthrosis is present significant associated neuroforaminal narrowing. No high-grade canal stenosis. Upper chest: Negative. Other: None IMPRESSION: 1. Left frontal scalp laceration focally approaching the calvarial periosteum. No calvarial fracture. 2. Small left frontal convexity chronic subdural hematoma or cystic hygroma measuring up to 5 mm in thickness. No resultant midline shift. 3. No acute intracranial hemorrhage or infarct. 4. No acute fracture or subluxation of the cervical spine. 5. Mild to moderate degenerative disc disease and facet arthrosis of the cervical spine. Electronically Signed   By: Dorethia Molt M.D.   On: 05/18/2024 00:57   CT Cervical Spine Wo Contrast Result Date: 05/18/2024 CLINICAL DATA:  Head trauma, moderate-severe; Neck trauma (Age >= 65y) EXAM: CT HEAD WITHOUT CONTRAST CT CERVICAL SPINE WITHOUT CONTRAST TECHNIQUE: Multidetector CT imaging of the head and cervical spine was performed following the standard protocol without intravenous contrast. Multiplanar CT image reconstructions of the cervical spine were also generated. RADIATION DOSE REDUCTION: This exam was performed according to the departmental dose-optimization program which includes automated exposure control, adjustment of the mA and/or kV according to patient size and/or use of  iterative reconstruction technique. COMPARISON:  None Available. FINDINGS: CT HEAD FINDINGS Brain: Low-attenuation crescentic extra-axial fluid collection is seen along the left frontal convexity in keeping with a chronic subdural hematoma or cystic hygroma measuring up to 5 mm in thickness. Mild mass effect the left cerebral hemisphere. No resultant midline shift. No acute intracranial hemorrhage or infarct. Moderate global parenchymal volume loss is commensurate with the patient's age. No abnormal intra or extra-axial mass lesion. Ventricular size is normal and commensurate with the degree of parenchymal volume loss. Cerebellum is. Vascular: No hyperdense vessel or unexpected calcification. Skull: Normal. Negative for fracture or focal lesion. Sinuses/Orbits: No acute finding. Other: Mastoid air cells and middle ear cavities are clear. Left frontal scalp laceration noted focally approaching the calvarial periosteum. CT CERVICAL SPINE FINDINGS Alignment: Normal. Skull base and vertebrae: No acute fracture. No primary bone lesion or focal pathologic process. Soft tissues and spinal canal: No prevertebral fluid or swelling. No visible canal  hematoma. Disc levels: Disc space narrowing and endplate remodeling at C5-C7 is present keeping with changes mild to moderate degenerative disc disease. Prevertebral soft tissues are not thickened on sagittal reformats. Multilevel facet arthrosis is present significant associated neuroforaminal narrowing. No high-grade canal stenosis. Upper chest: Negative. Other: None IMPRESSION: 1. Left frontal scalp laceration focally approaching the calvarial periosteum. No calvarial fracture. 2. Small left frontal convexity chronic subdural hematoma or cystic hygroma measuring up to 5 mm in thickness. No resultant midline shift. 3. No acute intracranial hemorrhage or infarct. 4. No acute fracture or subluxation of the cervical spine. 5. Mild to moderate degenerative disc disease and facet  arthrosis of the cervical spine. Electronically Signed   By: Dorethia Molt M.D.   On: 05/18/2024 00:57     I have personally reviewed the images and agree with the above interpretation.  Labs:    Latest Ref Rng & Units 12/21/2023   12:36 PM 10/31/2023    3:36 PM 10/25/2023    2:35 PM  CBC  WBC 4.0 - 10.5 K/uL 3.5  6.0  9.0   Hemoglobin 13.0 - 17.0 g/dL 87.7  87.4  86.7   Hematocrit 39.0 - 52.0 % 37.9  38.1  40.0   Platelets 150 - 400 K/uL 282  334  291       Latest Ref Rng & Units 12/21/2023   12:36 PM 10/31/2023    3:36 PM 10/25/2023    2:35 PM  BMP  Glucose 70 - 99 mg/dL 896  93  877   BUN 8 - 23 mg/dL 26  33  26   Creatinine 0.61 - 1.24 mg/dL 9.10  8.90  8.85   BUN/Creat Ratio 6 - 22 (calc)   23   Sodium 135 - 145 mmol/L 141  137  139   Potassium 3.5 - 5.1 mmol/L 3.8  3.7  4.6   Chloride 98 - 111 mmol/L 108  104  105   CO2 22 - 32 mmol/L 24  22  23    Calcium 8.9 - 10.3 mg/dL 9.1  8.6  9.1         Assessment and Plan: Mr. Violett is a pleasant 89 y.o. male with a head trauma.  He fell striking his head last night causing a large laceration on his forehead which has been approximated.  CT scan demonstrated a hygroma versus subdural hematoma, and repeat scan showed potential enlargement versus stability.  Because of this would like to have a stable scan.  Currently he is at his neurologic baseline.  He does have a headache as expected.  Once we get a repeat head CT and shows stability he is good to follow-up in neurosurgery clinic with repeat head CT in 3 to 4 weeks.  No evidence of cervical spine fracture, no cervical spine pain.  Given his age and early dementia would not recommend antiseizure prophylaxis as this can sometimes exacerbate mental status declines  Penne MICAEL Sharps, MD/MSCR Dept. of Neurosurgery   Spent a total of 40 minutes on his care today.  This was including face-to-face evaluation, record review, care discussion with the emergency medical  physician, documentation, and coordination of his care going forward.

## 2024-05-18 NOTE — ED Provider Notes (Addendum)
 S/o from Dr. Cyrena. See updated ED course below.   Physical Exam  BP (!) 167/89   Pulse 76   Temp 98.4 F (36.9 C)   Resp 16   Ht 5' 7 (1.702 m)   Wt 61 kg   SpO2 100%   BMI 21.06 kg/m   Physical Exam  Procedures  Procedures  ED Course / MDM   Clinical Course as of 05/18/24 1136  Sat May 18, 2024  0739 S/o from Dr. Cyrena: - 36M s/p fall, head lac, known subdural hematoma - initial CTH with possible interval increase - d/w Dr. Claudene of [facility], imaging reviewed - no neuro deficits, some baseline dementia  TO DO: - f/u rpt CTH at 10a.d/w NSGY once results.  [MM]  1002 Rpt CTH: IMPRESSION: 1. No interval change in size or appearance of left frontal convexity extra-axial fluid collection. Midline shift measures 4 mm. 2. Advanced atrophy and white matter disease. 3. Left frontotemporal scalp laceration without underlying fracture.   [MM]  1007 Called nsgy x2, no answer, VM left [MM]  1105 D/w Dr. Claudene of [facility]gy - Stable for discharge home, will see in clinic [MM]  1136 Son updated on plan, in agreement. [MM]    Clinical Course User Index [MM] Clarine Ozell LABOR, MD   Medical Decision Making Amount and/or Complexity of Data Reviewed Radiology: ordered.  Risk Prescription drug management.          Clarine Ozell LABOR, MD 05/18/24 1114    Clarine Ozell LABOR, MD 05/18/24 1136

## 2024-05-18 NOTE — ED Notes (Signed)
 Patient transported to CT

## 2024-05-18 NOTE — ED Provider Notes (Incomplete)
 [facility] Provider Note    Event Date/Time   First MD Initiated Contact with Patient 05/17/24 2342     (approximate)   History   Fall   HPI  Rodney Branch is a 89 y.o. male   Past medical history of ***    Independent Historian contributed to assessment above: ***  External Medical Documents Reviewed: ***      Physical Exam   Triage Vital Signs: ED Triage Vitals  Encounter Vitals Group     BP 05/17/24 2327 (!) 141/78     Girls Systolic BP Percentile --      Girls Diastolic BP Percentile --      Boys Systolic BP Percentile --      Boys Diastolic BP Percentile --      Pulse Rate 05/17/24 2327 81     Resp 05/17/24 2327 16     Temp 05/17/24 2327 97.8 F (36.6 C)     Temp Source 05/17/24 2327 Oral     SpO2 05/17/24 2327 98 %     Weight 05/17/24 2330 134 lb 7.7 oz (61 kg)     Height 05/17/24 2330 5' 7 (1.702 m)     Head Circumference --      Peak Flow --      Pain Score 05/17/24 2328 6     Pain Loc --      Pain Education --      Exclude from Growth Chart --     Most recent vital signs: Vitals:   05/17/24 2327  BP: (!) 141/78  Pulse: 81  Resp: 16  Temp: 97.8 F (36.6 C)  SpO2: 98%    General: Awake, no distress. *** CV:  Good peripheral perfusion. *** Resp:  Normal effort. *** Abd:  No distention. *** Other:  ***   ED Results / Procedures / Treatments   Labs (all labs ordered are listed, but only abnormal results are displayed) Labs Reviewed - No data to display   I ordered and reviewed the above labs they are notable for ***  EKG  ED ECG REPORT I, Buell Carmin, the attending physician, personally viewed and interpreted this ECG.   Date: 05/18/2024  EKG Time: ***  Rate: ***  Rhythm: {ekg findings:315101}  Axis: ***  Intervals:{conduction defects:17367}  ST&T Change: ***    RADIOLOGY I independently reviewed and interpreted *** I also reviewed radiologist's formal read.   PROCEDURES:  Critical Care  performed: {CriticalCareYesNo:19197::Yes, see critical care procedure note(s),No}  Procedures   MEDICATIONS ORDERED IN ED: Medications - No data to display  External physician / consultants:  I spoke with *** regarding care plan for this patient.   IMPRESSION / MDM / ASSESSMENT AND PLAN / ED COURSE  I reviewed the triage vital signs and the nursing notes.                                Patient's presentation is most consistent with {EM COPA:27473}  Differential diagnosis includes, but is not limited to, ***   ***The patient is on the cardiac monitor to evaluate for evidence of arrhythmia and/or significant heart rate changes.  MDM:  ***  I considered hospitalization for admission or observation ***        FINAL CLINICAL IMPRESSION(S) / ED DIAGNOSES   Final diagnoses:  None     Rx / DC Orders   ED Discharge Orders  None        Note:  This document was prepared using Dragon voice recognition software and may include unintentional dictation errors.

## 2024-05-21 ENCOUNTER — Other Ambulatory Visit: Payer: Self-pay | Admitting: Family Medicine

## 2024-05-21 ENCOUNTER — Telehealth: Payer: Self-pay | Admitting: Neurosurgery

## 2024-05-21 DIAGNOSIS — S065XAA Traumatic subdural hemorrhage with loss of consciousness status unknown, initial encounter: Secondary | ICD-10-CM

## 2024-05-21 NOTE — Telephone Encounter (Signed)
 Please order the following testing below

## 2024-05-21 NOTE — Telephone Encounter (Signed)
-----   Message from Penne LELON Sharps sent at 05/21/2024  7:59 AM EDT ----- Regarding: clinci f/u Clinic: [facility] Timeline: 2-4 weeks Tests to order: ct non con head

## 2024-05-21 NOTE — Telephone Encounter (Signed)
 CT scheduled 7.1.25 Appointment with Lyle 7.8.25

## 2024-05-28 ENCOUNTER — Ambulatory Visit: Attending: Neurosurgery

## 2024-06-03 NOTE — Progress Notes (Deleted)
 Referring Physician:  No referring provider defined for this encounter.  Primary Physician:  Patient, No Pcp Per  History of Present Illness: 06/03/2024 Mr. Rodney Branch is here today with a chief complaint of ***  Subdural Hematoma  Patient had a fall and went to the ER on 05/17/24.   Denies any headaches, dizziness or vision changes?  Past Surgery: ***no spinal surgeries?  ABDULLOH ULLOM has ***no symptoms of cervical myelopathy.  The symptoms are causing a significant impact on the patient's life.   Review of Systems:  A 10 point review of systems is negative, except for the pertinent positives and negatives detailed in the HPI.  Past Medical History: Past Medical History:  Diagnosis Date   Actinic keratosis 01/21/2016   R distal lat deltoid - bx proven   Arthritis    Basal cell carcinoma 12/28/2022   R ear posterior - Excised 01/31/23   Benign prostatic hypertrophy 1996   Depression 1996   Psych Admit ([place] ) 1996   Dysplastic nevus 09/16/2015   LUQA - moderate   Dysplastic nevus 09/16/2015   R side inf costal area - mod   Dysplastic nevus 02/15/2018   L upper back 4.0 cm lat to spine - mod   Dysplastic nevus 02/15/2018   R post flank sup - mod   Dysplastic nevus 02/15/2018   R post flank inf - mod to severe - excision 05/15/2018   Dysplastic nevus 02/15/2018   L post waistline/sacral - mod   Dysplastic nevus 09/12/2018   R lat periumbilical - mod   Dysplastic nevus 12/14/2021   supra umbilical, mod-sev, margins clear   GERD (gastroesophageal reflux disease)    INSOMNIA, CHRONIC 05/20/2009   STOPPED AMBIEN  2/2 ABUSE   Malignant melanoma (HCC) 2015   R abd (Albertini)   Malignant melanoma (HCC) 09/01/2021   right upper back   Psychiatric hospitalization 12/1994, 11/2014   self referral/anxiety depression, IVC for irrational behavior   Tremor, essential     Past Surgical History: Past Surgical History:  Procedure Laterality Date   CATARACT  EXTRACTION  2003   Dr. Geraldene   DUPUYTREN CONTRACTURE RELEASE  01/2012   Dr Jackye   TOE SURGERY  06/2023    Allergies: Allergies as of 06/04/2024 - Review Complete 05/17/2024  Allergen Reaction Noted   Metoprolol tartrate Other (See Comments) 11/11/2014   Propranolol  10/18/2011    Medications: Outpatient Encounter Medications as of 06/04/2024  Medication Sig   QUEtiapine  (SEROQUEL ) 300 MG tablet TAKE 2 TABLETS BY MOUTH AT BEDTIME   Cholecalciferol (VITAMIN D) 50 MCG (2000 UT) CAPS Take 1 capsule by mouth daily.   doxazosin  (CARDURA ) 8 MG tablet Take 1 tablet (8 mg total) by mouth daily.   ferrous sulfate 325 (65 FE) MG tablet Take 325 mg by mouth daily with breakfast.   omeprazole  (PRILOSEC) 20 MG capsule Take 1 capsule by mouth once daily   PARoxetine  (PAXIL ) 30 MG tablet Take 1 tablet (30 mg total) by mouth every morning.   polyethylene glycol powder (GLYCOLAX /MIRALAX ) 17 GM/SCOOP powder Take 17 g by mouth daily as needed.   primidone  (MYSOLINE ) 50 MG tablet TAKE 3 TABLETS BY MOUTH AT BEDTIME   vitamin B-12 (CYANOCOBALAMIN ) 1000 MCG tablet Take 1,000 mcg by mouth daily.   No facility-administered encounter medications on file as of 06/04/2024.    Social History: Social History   Tobacco Use   Smoking status: Never   Smokeless tobacco: Never  Vaping Use  Vaping status: Never Used  Substance Use Topics   Alcohol use: Not Currently    Alcohol/week: 0.0 standard drinks of alcohol   Drug use: No    Family Medical History: Family History  Problem Relation Age of Onset   Depression Mother 44       alzheimers//long history   Alzheimer's disease Mother    Cancer Father 19       cancer of stomach   Alcohol abuse Neg Hx    Diabetes Neg Hx    Stroke Neg Hx    CAD Neg Hx     Physical Examination: @VITALWITHPAIN @  General: Patient is well developed, well nourished, calm, collected, and in no apparent distress. Attention to examination is  appropriate.  Psychiatric: Patient is non-anxious.  Head:  Pupils equal, round, and reactive to light.  ENT:  Oral mucosa appears well hydrated.  Neck:   Supple.  ***Full range of motion.  Respiratory: Patient is breathing without any difficulty.  Extremities: No edema.  Vascular: Palpable dorsal pedal pulses.  Skin:   On exposed skin, there are no abnormal skin lesions.  NEUROLOGICAL:     Awake, alert, oriented to person, place, and time.  Speech is clear and fluent. Fund of knowledge is appropriate.   Cranial Nerves: Pupils equal round and reactive to light.  Facial tone is symmetric.  Facial sensation is symmetric.  ROM of spine: ***full.  Palpation of spine: ***non tender.    Strength: Side Biceps Triceps Deltoid Interossei Grip Wrist Ext. Wrist Flex.  R 5 5 5 5 5 5 5   L 5 5 5 5 5 5 5    Side Iliopsoas Quads Hamstring PF DF EHL  R 5 5 5 5 5 5   L 5 5 5 5 5 5    Reflexes are ***2+ and symmetric at the biceps, triceps, brachioradialis, patella and achilles.   Hoffman's is absent.  Clonus is not present.  Toes are down-going.  Bilateral upper and lower extremity sensation is intact to light touch.    Gait is normal.   No difficulty with tandem gait.   No evidence of dysmetria noted.  Medical Decision Making  Imaging: ***  I have personally reviewed the images and agree with the above interpretation.  Assessment and Plan: Mr. Pherigo is a pleasant 89 y.o. male with ***    Thank you for involving me in the care of this patient.   I spent a total of *** minutes in both face-to-face and non-face-to-face activities for this visit on the date of this encounter.   Lyle Decamp, PA-C Dept. of Neurosurgery

## 2024-06-04 ENCOUNTER — Ambulatory Visit: Admitting: Physician Assistant

## 2024-06-04 ENCOUNTER — Ambulatory Visit: Payer: PPO | Admitting: Psychiatry

## 2024-06-05 ENCOUNTER — Other Ambulatory Visit: Payer: Self-pay | Admitting: Psychiatry

## 2024-06-05 DIAGNOSIS — F319 Bipolar disorder, unspecified: Secondary | ICD-10-CM

## 2024-06-12 ENCOUNTER — Ambulatory Visit
Admission: RE | Admit: 2024-06-12 | Discharge: 2024-06-12 | Disposition: A | Discharge status: Home / Self Care | Source: Ambulatory Visit | Attending: Neurosurgery | Admitting: Neurosurgery

## 2024-06-12 DIAGNOSIS — S065XAA Traumatic subdural hemorrhage with loss of consciousness status unknown, initial encounter: Secondary | ICD-10-CM | POA: Insufficient documentation

## 2024-06-12 DIAGNOSIS — I6200 Nontraumatic subdural hemorrhage, unspecified: Secondary | ICD-10-CM | POA: Diagnosis not present

## 2024-06-17 DIAGNOSIS — Z5321 Procedure and treatment not carried out due to patient leaving prior to being seen by health care provider: Secondary | ICD-10-CM | POA: Diagnosis not present

## 2024-06-17 DIAGNOSIS — W01198A Fall on same level from slipping, tripping and stumbling with subsequent striking against other object, initial encounter: Secondary | ICD-10-CM | POA: Diagnosis not present

## 2024-06-17 DIAGNOSIS — S0990XA Unspecified injury of head, initial encounter: Secondary | ICD-10-CM | POA: Diagnosis not present

## 2024-06-17 DIAGNOSIS — I6203 Nontraumatic chronic subdural hemorrhage: Secondary | ICD-10-CM | POA: Diagnosis not present

## 2024-06-17 DIAGNOSIS — R9089 Other abnormal findings on diagnostic imaging of central nervous system: Secondary | ICD-10-CM | POA: Diagnosis not present

## 2024-06-17 DIAGNOSIS — R58 Hemorrhage, not elsewhere classified: Secondary | ICD-10-CM | POA: Diagnosis not present

## 2024-06-17 DIAGNOSIS — W19XXXA Unspecified fall, initial encounter: Secondary | ICD-10-CM | POA: Diagnosis not present

## 2024-06-17 DIAGNOSIS — G4489 Other headache syndrome: Secondary | ICD-10-CM | POA: Diagnosis not present

## 2024-06-17 DIAGNOSIS — S199XXA Unspecified injury of neck, initial encounter: Secondary | ICD-10-CM | POA: Diagnosis not present

## 2024-06-17 DIAGNOSIS — R402411 Glasgow coma scale score 13-15, in the field [EMT or ambulance]: Secondary | ICD-10-CM | POA: Diagnosis not present

## 2024-06-17 NOTE — ED Triage Notes (Signed)
 Pt arrives via EMS after having fall in [place], hitting head, lac to left side head; no LOC, no blood thinners

## 2024-06-18 ENCOUNTER — Ambulatory Visit: Admitting: Physician Assistant

## 2024-06-18 ENCOUNTER — Emergency Department

## 2024-06-18 ENCOUNTER — Other Ambulatory Visit: Payer: Self-pay

## 2024-06-18 ENCOUNTER — Emergency Department
Admission: EM | Admit: 2024-06-18 | Discharge: 2024-06-18 | Discharge status: Against Medical Advice | Attending: Emergency Medicine | Admitting: Emergency Medicine

## 2024-06-18 DIAGNOSIS — I6203 Nontraumatic chronic subdural hemorrhage: Secondary | ICD-10-CM | POA: Diagnosis not present

## 2024-06-18 DIAGNOSIS — R9089 Other abnormal findings on diagnostic imaging of central nervous system: Secondary | ICD-10-CM | POA: Diagnosis not present

## 2024-06-18 DIAGNOSIS — S199XXA Unspecified injury of neck, initial encounter: Secondary | ICD-10-CM | POA: Diagnosis not present

## 2024-06-18 DIAGNOSIS — S0990XA Unspecified injury of head, initial encounter: Secondary | ICD-10-CM | POA: Diagnosis not present

## 2024-06-18 NOTE — ED Triage Notes (Signed)
 Pt to ED via [facility] from home c/o head injury. Pt has laceration to right side of head. Hit head on floor. No loc, no blood thinners.

## 2024-06-24 ENCOUNTER — Other Ambulatory Visit: Payer: Self-pay

## 2024-06-24 ENCOUNTER — Emergency Department

## 2024-06-24 ENCOUNTER — Ambulatory Visit (INDEPENDENT_AMBULATORY_CARE_PROVIDER_SITE_OTHER)

## 2024-06-24 ENCOUNTER — Inpatient Hospital Stay
Admission: EM | Admit: 2024-06-24 | Discharge: 2024-06-28 | DRG: 884 | Disposition: A | Discharge status: Hospice | Attending: Hospitalist | Admitting: Hospitalist

## 2024-06-24 DIAGNOSIS — E538 Deficiency of other specified B group vitamins: Secondary | ICD-10-CM | POA: Diagnosis not present

## 2024-06-24 DIAGNOSIS — R0902 Hypoxemia: Secondary | ICD-10-CM | POA: Diagnosis present

## 2024-06-24 DIAGNOSIS — Y92003 Bedroom of unspecified non-institutional (private) residence as the place of occurrence of the external cause: Secondary | ICD-10-CM

## 2024-06-24 DIAGNOSIS — G9340 Encephalopathy, unspecified: Secondary | ICD-10-CM | POA: Diagnosis not present

## 2024-06-24 DIAGNOSIS — R630 Anorexia: Secondary | ICD-10-CM | POA: Insufficient documentation

## 2024-06-24 DIAGNOSIS — F319 Bipolar disorder, unspecified: Secondary | ICD-10-CM

## 2024-06-24 DIAGNOSIS — F0153 Vascular dementia, unspecified severity, with mood disturbance: Principal | ICD-10-CM | POA: Diagnosis present

## 2024-06-24 DIAGNOSIS — F919 Conduct disorder, unspecified: Secondary | ICD-10-CM | POA: Diagnosis present

## 2024-06-24 DIAGNOSIS — F5105 Insomnia due to other mental disorder: Secondary | ICD-10-CM

## 2024-06-24 DIAGNOSIS — I495 Sick sinus syndrome: Secondary | ICD-10-CM | POA: Diagnosis not present

## 2024-06-24 DIAGNOSIS — R4182 Altered mental status, unspecified: Secondary | ICD-10-CM | POA: Diagnosis not present

## 2024-06-24 DIAGNOSIS — I3139 Other pericardial effusion (noninflammatory): Secondary | ICD-10-CM | POA: Diagnosis not present

## 2024-06-24 DIAGNOSIS — E876 Hypokalemia: Secondary | ICD-10-CM | POA: Diagnosis not present

## 2024-06-24 DIAGNOSIS — Z85828 Personal history of other malignant neoplasm of skin: Secondary | ICD-10-CM

## 2024-06-24 DIAGNOSIS — Z66 Do not resuscitate: Secondary | ICD-10-CM | POA: Diagnosis present

## 2024-06-24 DIAGNOSIS — Z85820 Personal history of malignant melanoma of skin: Secondary | ICD-10-CM

## 2024-06-24 DIAGNOSIS — G4700 Insomnia, unspecified: Secondary | ICD-10-CM | POA: Diagnosis present

## 2024-06-24 DIAGNOSIS — R636 Underweight: Secondary | ICD-10-CM | POA: Diagnosis present

## 2024-06-24 DIAGNOSIS — G250 Essential tremor: Secondary | ICD-10-CM | POA: Diagnosis not present

## 2024-06-24 DIAGNOSIS — R531 Weakness: Secondary | ICD-10-CM | POA: Diagnosis not present

## 2024-06-24 DIAGNOSIS — I2510 Atherosclerotic heart disease of native coronary artery without angina pectoris: Secondary | ICD-10-CM | POA: Diagnosis not present

## 2024-06-24 DIAGNOSIS — R410 Disorientation, unspecified: Secondary | ICD-10-CM | POA: Diagnosis not present

## 2024-06-24 DIAGNOSIS — I10 Essential (primary) hypertension: Secondary | ICD-10-CM | POA: Diagnosis not present

## 2024-06-24 DIAGNOSIS — E870 Hyperosmolality and hypernatremia: Secondary | ICD-10-CM | POA: Diagnosis present

## 2024-06-24 DIAGNOSIS — Z515 Encounter for palliative care: Secondary | ICD-10-CM

## 2024-06-24 DIAGNOSIS — F32A Depression, unspecified: Secondary | ICD-10-CM | POA: Diagnosis present

## 2024-06-24 DIAGNOSIS — F05 Delirium due to known physiological condition: Secondary | ICD-10-CM | POA: Diagnosis not present

## 2024-06-24 DIAGNOSIS — Z681 Body mass index (BMI) 19 or less, adult: Secondary | ICD-10-CM

## 2024-06-24 DIAGNOSIS — S2242XA Multiple fractures of ribs, left side, initial encounter for closed fracture: Principal | ICD-10-CM | POA: Diagnosis present

## 2024-06-24 DIAGNOSIS — E873 Alkalosis: Secondary | ICD-10-CM | POA: Diagnosis present

## 2024-06-24 DIAGNOSIS — Z9181 History of falling: Secondary | ICD-10-CM

## 2024-06-24 DIAGNOSIS — E860 Dehydration: Secondary | ICD-10-CM | POA: Diagnosis not present

## 2024-06-24 DIAGNOSIS — R7989 Other specified abnormal findings of blood chemistry: Secondary | ICD-10-CM | POA: Diagnosis present

## 2024-06-24 DIAGNOSIS — G9341 Metabolic encephalopathy: Secondary | ICD-10-CM | POA: Diagnosis present

## 2024-06-24 DIAGNOSIS — J90 Pleural effusion, not elsewhere classified: Secondary | ICD-10-CM | POA: Diagnosis not present

## 2024-06-24 DIAGNOSIS — N400 Enlarged prostate without lower urinary tract symptoms: Secondary | ICD-10-CM | POA: Diagnosis present

## 2024-06-24 DIAGNOSIS — R0989 Other specified symptoms and signs involving the circulatory and respiratory systems: Secondary | ICD-10-CM | POA: Diagnosis not present

## 2024-06-24 DIAGNOSIS — Z7189 Other specified counseling: Secondary | ICD-10-CM | POA: Diagnosis not present

## 2024-06-24 DIAGNOSIS — K219 Gastro-esophageal reflux disease without esophagitis: Secondary | ICD-10-CM | POA: Diagnosis not present

## 2024-06-24 DIAGNOSIS — Z5982 Transportation insecurity: Secondary | ICD-10-CM

## 2024-06-24 DIAGNOSIS — S2243XA Multiple fractures of ribs, bilateral, initial encounter for closed fracture: Secondary | ICD-10-CM | POA: Diagnosis not present

## 2024-06-24 DIAGNOSIS — R9389 Abnormal findings on diagnostic imaging of other specified body structures: Secondary | ICD-10-CM | POA: Diagnosis not present

## 2024-06-24 DIAGNOSIS — R54 Age-related physical debility: Secondary | ICD-10-CM | POA: Diagnosis present

## 2024-06-24 DIAGNOSIS — Z888 Allergy status to other drugs, medicaments and biological substances status: Secondary | ICD-10-CM

## 2024-06-24 DIAGNOSIS — R627 Adult failure to thrive: Secondary | ICD-10-CM | POA: Diagnosis present

## 2024-06-24 DIAGNOSIS — W06XXXA Fall from bed, initial encounter: Secondary | ICD-10-CM | POA: Diagnosis present

## 2024-06-24 DIAGNOSIS — Z9849 Cataract extraction status, unspecified eye: Secondary | ICD-10-CM

## 2024-06-24 DIAGNOSIS — Z79899 Other long term (current) drug therapy: Secondary | ICD-10-CM

## 2024-06-24 DIAGNOSIS — I6782 Cerebral ischemia: Secondary | ICD-10-CM | POA: Diagnosis not present

## 2024-06-24 DIAGNOSIS — R0602 Shortness of breath: Secondary | ICD-10-CM | POA: Diagnosis not present

## 2024-06-24 DIAGNOSIS — Z820 Family history of epilepsy and other diseases of the nervous system: Secondary | ICD-10-CM

## 2024-06-24 LAB — URINALYSIS, W/ REFLEX TO CULTURE (INFECTION SUSPECTED)
Bacteria, UA: NONE SEEN
Bilirubin Urine: NEGATIVE
Glucose, UA: 50 mg/dL — AB
Ketones, ur: 20 mg/dL — AB
Leukocytes,Ua: NEGATIVE
Nitrite: NEGATIVE
Protein, ur: 100 mg/dL — AB
RBC / HPF: >50 RBC/hpf (ref 0–5)
Specific Gravity, Urine: 1.027 (ref 1.005–1.030)
Squamous Epithelial / HPF: 0 /HPF (ref 0–5)
pH: 6 (ref 5.0–8.0)

## 2024-06-24 LAB — URINE DRUG SCREEN, QUALITATIVE (ARMC ONLY)
Amphetamines, Ur Screen: NOT DETECTED
Barbiturates, Ur Screen: POSITIVE — AB
Benzodiazepine, Ur Scrn: POSITIVE — AB
Cannabinoid 50 Ng, Ur ~~LOC~~: NOT DETECTED
Cocaine Metabolite,Ur ~~LOC~~: NOT DETECTED
MDMA (Ecstasy)Ur Screen: NOT DETECTED
Methadone Scn, Ur: NOT DETECTED
Opiate, Ur Screen: NOT DETECTED
Phencyclidine (PCP) Ur S: NOT DETECTED
Tricyclic, Ur Screen: POSITIVE — AB

## 2024-06-24 LAB — COMPREHENSIVE METABOLIC PANEL WITH GFR
ALT: 13 U/L (ref 0–44)
AST: 25 U/L (ref 15–41)
Albumin: 3.4 g/dL — ABNORMAL LOW (ref 3.5–5.0)
Alkaline Phosphatase: 96 U/L (ref 38–126)
Anion gap: 14 (ref 5–15)
BUN: 37 mg/dL — ABNORMAL HIGH (ref 8–23)
CO2: 20 mmol/L — ABNORMAL LOW (ref 22–32)
Calcium: 9.2 mg/dL (ref 8.9–10.3)
Chloride: 112 mmol/L — ABNORMAL HIGH (ref 98–111)
Creatinine, Ser: 1.16 mg/dL (ref 0.61–1.24)
GFR, Estimated: 59 mL/min — ABNORMAL LOW (ref >60)
Glucose, Bld: 152 mg/dL — ABNORMAL HIGH (ref 70–99)
Potassium: 3.2 mmol/L — ABNORMAL LOW (ref 3.5–5.1)
Sodium: 146 mmol/L — ABNORMAL HIGH (ref 135–145)
Total Bilirubin: 1.1 mg/dL (ref 0.0–1.2)
Total Protein: 6.4 g/dL — ABNORMAL LOW (ref 6.5–8.1)

## 2024-06-24 LAB — CBC WITH DIFFERENTIAL/PLATELET
Abs Immature Granulocytes: 0.06 K/uL (ref 0.00–0.07)
Basophils Absolute: 0 K/uL (ref 0.0–0.1)
Basophils Relative: 1 %
Eosinophils Absolute: 0 K/uL (ref 0.0–0.5)
Eosinophils Relative: 1 %
HCT: 32.9 % — ABNORMAL LOW (ref 39.0–52.0)
Hemoglobin: 11.1 g/dL — ABNORMAL LOW (ref 13.0–17.0)
Immature Granulocytes: 1 %
Lymphocytes Relative: 19 %
Lymphs Abs: 1.1 K/uL (ref 0.7–4.0)
MCH: 29.6 pg (ref 26.0–34.0)
MCHC: 33.7 g/dL (ref 30.0–36.0)
MCV: 87.7 fL (ref 80.0–100.0)
Monocytes Absolute: 0.5 K/uL (ref 0.1–1.0)
Monocytes Relative: 9 %
Neutro Abs: 3.8 K/uL (ref 1.7–7.7)
Neutrophils Relative %: 69 %
Platelets: 384 K/uL (ref 150–400)
RBC: 3.75 MIL/uL — ABNORMAL LOW (ref 4.22–5.81)
RDW: 14.8 % (ref 11.5–15.5)
WBC: 5.5 K/uL (ref 4.0–10.5)
nRBC: 0 % (ref 0.0–0.2)

## 2024-06-24 LAB — TYPE AND SCREEN
ABO/RH(D): O POS
Antibody Screen: NEGATIVE

## 2024-06-24 LAB — MAGNESIUM: Magnesium: 2.2 mg/dL (ref 1.7–2.4)

## 2024-06-24 LAB — BRAIN NATRIURETIC PEPTIDE: B Natriuretic Peptide: 76.3 pg/mL (ref 0.0–100.0)

## 2024-06-24 LAB — BLOOD GAS, VENOUS

## 2024-06-24 MED ORDER — PAROXETINE HCL 30 MG PO TABS
30.0000 mg | ORAL_TABLET | Freq: Every morning | ORAL | Status: DC
  Administered 2024-06-26 – 2024-06-28 (×3): 30 mg via ORAL
  Filled 2024-06-24 (×4): qty 1

## 2024-06-24 MED ORDER — POLYETHYLENE GLYCOL 3350 17 G PO PACK: 17.0000 g | PACK | Freq: Every day | ORAL | Status: DC | PRN

## 2024-06-24 MED ORDER — MIDAZOLAM HCL 2 MG/2ML IJ SOLN
2.0000 mg | Freq: Once | INTRAMUSCULAR | Status: AC
  Administered 2024-06-24: 2 mg via INTRAVENOUS
  Filled 2024-06-24: qty 2

## 2024-06-24 MED ORDER — QUETIAPINE FUMARATE 300 MG PO TABS
600.0000 mg | ORAL_TABLET | Freq: Every day | ORAL | Status: DC
  Administered 2024-06-25 – 2024-06-27 (×3): 600 mg via ORAL
  Filled 2024-06-24 (×4): qty 2

## 2024-06-24 MED ORDER — IOHEXOL 350 MG/ML SOLN
75.0000 mL | Freq: Once | INTRAVENOUS | Status: AC | PRN
  Administered 2024-06-24: 75 mL via INTRAVENOUS

## 2024-06-24 MED ORDER — DOXAZOSIN MESYLATE 4 MG PO TABS
8.0000 mg | ORAL_TABLET | Freq: Every day | ORAL | Status: DC
  Administered 2024-06-25 – 2024-06-28 (×4): 8 mg via ORAL
  Filled 2024-06-24 (×6): qty 2

## 2024-06-24 MED ORDER — VITAMIN B-12 1000 MCG PO TABS
1000.0000 ug | ORAL_TABLET | Freq: Every day | ORAL | Status: DC
  Administered 2024-06-26 – 2024-06-28 (×3): 1000 ug via ORAL
  Filled 2024-06-24 (×4): qty 1

## 2024-06-24 MED ORDER — ENOXAPARIN SODIUM 30 MG/0.3ML IJ SOSY
30.0000 mg | PREFILLED_SYRINGE | INTRAMUSCULAR | Status: DC
  Administered 2024-06-24: 30 mg via SUBCUTANEOUS
  Filled 2024-06-24: qty 0.3

## 2024-06-24 MED ORDER — FERROUS SULFATE 325 (65 FE) MG PO TABS
325.0000 mg | ORAL_TABLET | Freq: Every day | ORAL | Status: DC
  Administered 2024-06-26 – 2024-06-28 (×3): 325 mg via ORAL
  Filled 2024-06-24 (×4): qty 1

## 2024-06-24 MED ORDER — HALOPERIDOL LACTATE 5 MG/ML IJ SOLN
2.0000 mg | Freq: Once | INTRAMUSCULAR | Status: AC
  Administered 2024-06-24: 2 mg via INTRAMUSCULAR
  Filled 2024-06-24: qty 1

## 2024-06-24 MED ORDER — TRAZODONE HCL 50 MG PO TABS
25.0000 mg | ORAL_TABLET | Freq: Every evening | ORAL | Status: DC | PRN
Start: 2024-06-24 — End: 2024-06-28
  Administered 2024-06-25 – 2024-06-27 (×3): 25 mg via ORAL
  Filled 2024-06-24 (×3): qty 1

## 2024-06-24 MED ORDER — ONDANSETRON HCL 4 MG/2ML IJ SOLN: 4.0000 mg | Freq: Four times a day (QID) | INTRAMUSCULAR | Status: DC | PRN

## 2024-06-24 MED ORDER — MAGNESIUM HYDROXIDE 400 MG/5ML PO SUSP
30.0000 mL | Freq: Every day | ORAL | Status: DC | PRN
  Administered 2024-06-25: 30 mL via ORAL
  Filled 2024-06-24: qty 30

## 2024-06-24 MED ORDER — ONDANSETRON HCL 4 MG PO TABS: 4.0000 mg | ORAL_TABLET | Freq: Four times a day (QID) | ORAL | Status: DC | PRN

## 2024-06-24 MED ORDER — SODIUM CHLORIDE 0.9 % IV SOLN: INTRAVENOUS | Status: DC

## 2024-06-24 MED ORDER — VITAMIN D 25 MCG (1000 UNIT) PO TABS
2000.0000 [IU] | ORAL_TABLET | Freq: Every day | ORAL | Status: DC
  Administered 2024-06-26 – 2024-06-28 (×3): 2000 [IU] via ORAL
  Filled 2024-06-24 (×4): qty 2

## 2024-06-24 MED ORDER — ACETAMINOPHEN 325 MG PO TABS
650.0000 mg | ORAL_TABLET | Freq: Four times a day (QID) | ORAL | Status: DC | PRN
  Administered 2024-06-27: 650 mg via ORAL
  Filled 2024-06-24: qty 2

## 2024-06-24 MED ORDER — ACETAMINOPHEN 650 MG RE SUPP
650.0000 mg | Freq: Four times a day (QID) | RECTAL | Status: DC | PRN
Start: 2024-06-24 — End: 2024-06-28

## 2024-06-24 MED ORDER — PRIMIDONE 50 MG PO TABS
150.0000 mg | ORAL_TABLET | Freq: Every day | ORAL | Status: DC
  Administered 2024-06-25 – 2024-06-27 (×3): 150 mg via ORAL
  Filled 2024-06-24 (×4): qty 3

## 2024-06-24 NOTE — ED Notes (Signed)
 Pt expressing urgency to urinate but is unable to when assisted. RN notified

## 2024-06-24 NOTE — ED Triage Notes (Addendum)
 Pt to ED from [employer] (PCP appt) for possible FTT. PCP called EMS because pt has not eaten, been drinking fluids, or taking his meds for 3 days. Was 78% on RA at PCP. Currently 4L, does not wear oxygen at baseline  EMS VS: ETC02 11-19, RR 16-18, 168/88, HR nml, 97.3 axillary, CBG 105 98% on 4L Family does not know pt's baseline mental status per EMS  EDP at bedside  Pt not answering questions in triage. Respirations appear labored. Lips are dry.  Unable to obtain PMH from pt.  Dried blood noted on lips.

## 2024-06-24 NOTE — ED Notes (Signed)
 Sitter with pt  mitts on pt.  Iv fluids infusing.  Pt awake and talking.

## 2024-06-24 NOTE — ED Notes (Signed)
 Bed alarm placed. Pt picking at bracelet and wires.

## 2024-06-24 NOTE — Assessment & Plan Note (Addendum)
 89 year old male with multiple co morbidities brought to the clinic by his son and his wife for evaluation of back pain, refusal to eat/drink/take medications. On exam he is  confused. Has signs of dehydration. Unfortunately, we are not able to treat him with IV hydration. We are unable to obtain imaging of brain, get serial labs and treat his acute conditions in outpatient clinic. He will also require telemetry given tacy-brady in the clinic.  Given hypoxia, tachycardia sepsis cannot be ruled out. He will need blood culture and we are not equipped to manage that here in the clinic. After discussing this with patient's wife and son EMS were called and patient will be taken to ED for further evaluation.

## 2024-06-24 NOTE — Assessment & Plan Note (Signed)
 89 year old male with multiple co morbidities brought to the clinic by his son and his wife for evaluation of back pain, refusal to eat/drink/take medications. On exam he is  confused. Has signs of dehydration. Unfortunately, we are not able to treat him with IV hydration. We are unable to obtain imaging of brain, get serial labs and treat his acute conditions in outpatient clinic. He will also require telemetry given tacy-brady in the clinic.  Given hypoxia, tachycardia sepsis cannot be ruled out. He will need blood culture and we are not equipped to manage that here in the clinic. After discussing this with patient's wife and son EMS were called and patient will be taken to ED for further evaluation.

## 2024-06-24 NOTE — ED Notes (Signed)
 Pt refuses to keep EKG leads on. This Rn tried to replace EKG leads, pt pushing at this RN

## 2024-06-24 NOTE — ED Notes (Signed)
 EDT [place] sitting with pt as Recruitment consultant.

## 2024-06-24 NOTE — ED Notes (Signed)
 [name] son on came to check in on pt status at shift change. This tech informed him only mr Rodney Branch's wife is on the party release but we would be happy to call her when we have the time to update. [name] son expressed understanding. This tech will request RN to call when the opportunity presents itself

## 2024-06-24 NOTE — ED Provider Notes (Signed)
 [facility] Provider Note   Event Date/Time   First MD Initiated Contact with Patient 06/24/24 1454     (approximate) History  Failure To Thrive  HPI Rodney Branch is a 89 y.o. male who presents from his primary care doctor's office with main concern of altered mental status and failure to thrive.  Patient was unable to answer questions appropriately at his physician's office as well as was found to be hypoxic in the 78%.  Patient also has dried dark material around his lips concerning for possible coffee-ground emesis.  Further history is unable to be obtained at this time secondary to patient's mental status ROS: Unable to assess   Physical Exam  Triage Vital Signs: ED Triage Vitals  Encounter Vitals Group     BP 06/24/24 1455 (!) 153/97     Girls Systolic BP Percentile --      Girls Diastolic BP Percentile --      Boys Systolic BP Percentile --      Boys Diastolic BP Percentile --      Pulse Rate 06/24/24 1449 88     Resp 06/24/24 1449 (!) 24     Temp 06/24/24 1457 98.4 F (36.9 C)     Temp Source 06/24/24 1457 Oral     SpO2 06/24/24 1449 100 %     Weight 06/24/24 1451 108 lb 7.5 oz (49.2 kg)     Height 06/24/24 1451 5' 6 (1.676 m)     Head Circumference --      Peak Flow --      Pain Score --      Pain Loc --      Pain Education --      Exclude from Growth Chart --    Most recent vital signs: Vitals:   06/24/24 1457 06/24/24 2009  BP:  (!) 149/89  Pulse:  93  Resp:  18  Temp: 98.4 F (36.9 C) 98.5 F (36.9 C)  SpO2:  100%   General: Awake, cooperative.  Following commands CV:  Good peripheral perfusion. Resp:  Normal effort. Abd:  No distention. Other:  Elderly well-developed, well-nourished Caucasian male resting comfortably in no acute distress ED Results / Procedures / Treatments  Labs (all labs ordered are listed, but only abnormal results are displayed) Labs Reviewed  CBC WITH DIFFERENTIAL/PLATELET - Abnormal; Notable for the  following components:      Result Value   RBC 3.75 (*)    Hemoglobin 11.1 (*)    HCT 32.9 (*)    All other components within normal limits  COMPREHENSIVE METABOLIC PANEL WITH GFR - Abnormal; Notable for the following components:   Sodium 146 (*)    Potassium 3.2 (*)    Chloride 112 (*)    CO2 20 (*)    Glucose, Bld 152 (*)    BUN 37 (*)    Total Protein 6.4 (*)    Albumin 3.4 (*)    GFR, Estimated 59 (*)    All other components within normal limits  BLOOD GAS, VENOUS - Abnormal; Notable for the following components:   pH, Ven 7.61 (*)    pCO2, Ven 21 (*)    pO2, Ven <31 (*)    All other components within normal limits  URINALYSIS, W/ REFLEX TO CULTURE (INFECTION SUSPECTED) - Abnormal; Notable for the following components:   Color, Urine AMBER (*)    APPearance CLEAR (*)    Glucose, UA 50 (*)    Hgb urine  dipstick SMALL (*)    Ketones, ur 20 (*)    Protein, ur 100 (*)    All other components within normal limits  MAGNESIUM   BRAIN NATRIURETIC PEPTIDE  URINE DRUG SCREEN, QUALITATIVE ([facility] ONLY)  TYPE AND SCREEN   EKG ED ECG REPORT I, Artist MARLA Kerns, the attending physician, personally viewed and interpreted this ECG. Date: 06/24/2024 EKG Time: 2005 Rate: 97 Rhythm: normal sinus rhythm QRS Axis: normal Intervals: normal ST/T Wave abnormalities: normal Narrative Interpretation: no evidence of acute ischemia RADIOLOGY ED MD interpretation: CT angiography of the chest independently interpreted and shows nondisplaced fractures of the posterior left 10th and 11th ribs CT of the head without contrast interpreted by me shows no evidence of acute abnormalities including no intracerebral hemorrhage, obvious masses, or significant edema One-view portable chest x-ray interpreted by me shows no evidence of acute abnormalities including no pneumonia, pneumothorax, or widened mediastinum - All radiology independently interpreted and agree with radiology assessment Official radiology  report(s): CT Angio Chest PE W/Cm &/Or Wo Cm Result Date: 06/24/2024 CLINICAL DATA:  Pulmonary embolism (PE) suspected, high prob EXAM: CT ANGIOGRAPHY CHEST WITH CONTRAST TECHNIQUE: Multidetector CT imaging of the chest was performed using the standard protocol during bolus administration of intravenous contrast. Multiplanar CT image reconstructions and MIPs were obtained to evaluate the vascular anatomy. RADIATION DOSE REDUCTION: This exam was performed according to the departmental dose-optimization program which includes automated exposure control, adjustment of the mA and/or kV according to patient size and/or use of iterative reconstruction technique. CONTRAST:  75mL OMNIPAQUE  IOHEXOL  350 MG/ML SOLN COMPARISON:  06/24/2024 FINDINGS: Pulmonary Embolism: No pulmonary embolism. Cardiovascular: No cardiomegaly. Small pericardial effusion. Scattered calcified coronary atherosclerosis.Fusiform dilation of the ascending aorta measuring 4 cm. Calcified atherosclerosis throughout the aorta. Mediastinum/Nodes: No mediastinal mass. No mediastinal, hilar, or axillary lymphadenopathy. Small hiatal hernia. Lungs/Pleura: The midline trachea and bronchi are patent. Small left pleural effusion. No pneumothorax. Posterior bibasilar dependent atelectasis. Fibrolinear scarring in the right middle lobe. Musculoskeletal: Nondisplaced fractures of the posterior left tenth and eleventh ribs. Severe right glenohumeral joint osteoarthritis. Diffuse osteopenia. A couple of remote, healed right-sided rib fractures. Multilevel degenerative disc disease of the spine. Thoracic DISH. Upper Abdomen: No acute abnormality in the partially visualized upper abdomen. Review of the MIP images confirms the above findings. IMPRESSION: 1. Nondisplaced fractures of the posterior left tenth and eleventh ribs. No pneumothorax. 2. Small left pleural effusion. 3. No pulmonary embolism. Aortic Atherosclerosis (ICD10-I70.0). Electronically Signed   By:  Rogelia Myers M.D.   On: 06/24/2024 21:09   CT Head Wo Contrast Result Date: 06/24/2024 CLINICAL DATA:  Altered mental status EXAM: CT HEAD WITHOUT CONTRAST TECHNIQUE: Contiguous axial images were obtained from the base of the skull through the vertex without intravenous contrast. RADIATION DOSE REDUCTION: This exam was performed according to the departmental dose-optimization program which includes automated exposure control, adjustment of the mA and/or kV according to patient size and/or use of iterative reconstruction technique. COMPARISON:  06/18/2024 FINDINGS: Brain: No evidence of acute infarction, hemorrhage, hydrocephalus, extra-axial collection or mass lesion/mass effect. Chronic atrophic and ischemic changes are noted. Previously seen left-sided subdural hematoma is no longer identified. Vascular: No hyperdense vessel or unexpected calcification. Skull: Normal. Negative for fracture or focal lesion. Sinuses/Orbits: No acute finding. Other: None. IMPRESSION: Chronic atrophic and ischemic changes without acute abnormality. Electronically Signed   By: Oneil Devonshire M.D.   On: 06/24/2024 21:01   DG Chest Port 1 View Result Date: 06/24/2024 CLINICAL DATA:  Shortness  of breath. EXAM: PORTABLE CHEST 1 VIEW COMPARISON:  10/31/2023. FINDINGS: Low lung volume. Mild diffuse increased interstitial markings, likely accentuated by low lung volume. Bilateral lung fields are clear. No acute consolidation or lung collapse. Note is made of mildly elevated right hemidiaphragm. Bilateral costophrenic angles are clear. Normal cardio-mediastinal silhouette. No acute osseous abnormalities. The soft tissues are within normal limits. IMPRESSION: No active disease. Electronically Signed   By: Ree Molt M.D.   On: 06/24/2024 15:19   PROCEDURES: Critical Care performed: No .1-3 Lead EKG Interpretation  Performed by: Jossie Artist POUR, MD Authorized by: Jossie Artist POUR, MD     Interpretation: normal     ECG rate:   91   ECG rate assessment: normal     Rhythm: sinus rhythm     Ectopy: none     Conduction: normal    MEDICATIONS ORDERED IN ED: Medications  haloperidol  lactate (HALDOL ) injection 2 mg (2 mg Intramuscular Given 06/24/24 1534)  midazolam  (VERSED ) injection 2 mg (2 mg Intravenous Given 06/24/24 1634)  iohexol  (OMNIPAQUE ) 350 MG/ML injection 75 mL (75 mLs Intravenous Contrast Given 06/24/24 2032)  midazolam  (VERSED ) injection 2 mg (2 mg Intravenous Given 06/24/24 1948)   IMPRESSION / MDM / ASSESSMENT AND PLAN / ED COURSE  I reviewed the triage vital signs and the nursing notes.                             The patient is on the cardiac monitor to evaluate for evidence of arrhythmia and/or significant heart rate changes. Patient's presentation is most consistent with acute presentation with potential threat to life or bodily function. Patient presents for altered mental status of unknown origin  Will obtain medical workup and discuss with social work to try to obtain collateral information.  Given History, Physical, and Workup there is no overt concern for a dangerous emergent cause such as, but not limited to, CNS infection, severe Toxidrome, severe metabolic derangement, or stroke.  CT chest does show evidence of acute left posterior nondisplaced 10th and 11th rib fracture likely causing patient's respiratory alkalosis and altered mental status.  Disposition: Admit; the patient is suffering altered mental status that is persistent and therefore they will be admitted.   FINAL CLINICAL IMPRESSION(S) / ED DIAGNOSES   Final diagnoses:  Closed fracture of multiple ribs of left side, initial encounter  Delirium   Rx / DC Orders   ED Discharge Orders     None      Note:  This document was prepared using Dragon voice recognition software and may include unintentional dictation errors.   Jossie Artist POUR, MD 06/24/24 [phone]

## 2024-06-24 NOTE — Progress Notes (Signed)
 Anticoagulation monitoring(Lovenox ):  89 yo male ordered Lovenox  40 mg Q24h    Filed Weights   06/24/24 1451  Weight: 49.2 kg (108 lb 7.5 oz)   BMI 17.5   Lab Results  Component Value Date   CREATININE 1.16 06/24/2024   CREATININE 0.89 12/21/2023   CREATININE 1.09 10/31/2023   Estimated Creatinine Clearance: 28.9 mL/min (by C-G formula based on SCr of 1.16 mg/dL). Hemoglobin & Hematocrit     Component Value Date/Time   HGB 11.1 (L) 06/24/2024 1451   HGB 12.2 (L) 12/21/2023 1236   HGB 12.4 (L) 03/25/2016 0901   HCT 32.9 (L) 06/24/2024 1451   HCT 37.2 (L) 03/25/2016 0901     Per Protocol for Patient with estCrcl < 30 ml/min and BMI < 30, will transition to Lovenox  30 mg Q24h.

## 2024-06-24 NOTE — Progress Notes (Signed)
 Acute Office Visit  Subjective:    Patient ID: Rodney Branch, male    DOB: 10-09-1931, 89 y.o.   MRN: [id number]  Chief Complaint  Patient presents with   Back Pain    Refusing eating, drinking for 3-4 days.    Back Pain   - Patient is in today with his wife Avelina, son [name] with concerns of change in mental status from baseline, refusing to eat, drink, take medications, back pain. - Patient has a h/o dementia and has noted to have worsening mentation from baseline.  - History obtained from wife and son. Patient declines chest pain.  - Family reports he has not eaten, drank fluids and refused taking medications. At home he keeps on repeating,  I want to die. Has complained of back pain. Was seen at [facility] on 06/18/24 after sustaining a fall. Has a laceration or right forehead from this fall.    Review of Systems  Musculoskeletal:  Positive for back pain.   As per HPI    Objective:    There were no vitals taken for this visit.    Physical Exam Constitutional:      Appearance: He is ill-appearing.     Comments: Sitting in wheelchair  HENT:     Head:     Comments: Right forehead laceration, healing    Mouth/Throat:     Mouth: Mucous membranes are dry.     Comments: Dry, cracked lips and tongue Cardiovascular:     Comments: Tachy-brady with HR fluctuating from 30-110/min Pulmonary:     Breath sounds: No wheezing.     Comments: Spo2 in room air 79% Abdominal:     Tenderness: There is no guarding.  Skin:    General: Skin is warm and dry.     Comments: Skin warm to touch  Neurological:     Mental Status: He is lethargic and confused.  Psychiatric:        Mood and Affect: Mood is anxious.     No results found for any visits on 06/24/24.     Assessment & Plan:    Dehydration Assessment & Plan: 89 year old male with multiple co morbidities brought to the clinic by his son and his wife for evaluation of back pain, refusal to eat/drink/take medications. On exam he  is  confused. Has signs of dehydration. Unfortunately, we are not able to treat him with IV hydration. We are unable to obtain imaging of brain, get serial labs and treat his acute conditions in outpatient clinic. He will also require telemetry given tacy-brady in the clinic.  Given hypoxia, tachycardia sepsis cannot be ruled out. He will need blood culture and we are not equipped to manage that here in the clinic. After discussing this with patient's wife and son EMS were called and patient will be taken to ED for further evaluation.    Appetite lost Assessment & Plan: 89 year old male with multiple co morbidities brought to the clinic by his son and his wife for evaluation of back pain, refusal to eat/drink/take medications. On exam he is  confused. Has signs of dehydration. Unfortunately, we are not able to treat him with IV hydration. We are unable to obtain imaging of brain, get serial labs and treat his acute conditions in outpatient clinic. He will also require telemetry given tacy-brady in the clinic.  Given hypoxia, tachycardia sepsis cannot be ruled out. He will need blood culture and we are not equipped to manage that here  in the clinic. After discussing this with patient's wife and son EMS were called and patient will be taken to ED for further evaluation.    Tachycardia-bradycardia [facility]) Assessment & Plan: 89 year old male with multiple co morbidities brought to the clinic by his son and his wife for evaluation of back pain, refusal to eat/drink/take medications. On exam he is  confused. Has signs of dehydration. Unfortunately, we are not able to treat him with IV hydration. We are unable to obtain imaging of brain, get serial labs and treat his acute conditions in outpatient clinic. He will also require telemetry given tacy-brady in the clinic.  Given hypoxia, tachycardia sepsis cannot be ruled out. He will need blood culture and we are not equipped to manage that here in the clinic. After  discussing this with patient's wife and son EMS were called and patient will be taken to ED for further evaluation.    I spent 30 minutes on the day of this face-to-face encounter reviewing the patient's medical history, current medications, ongoing concerns, and reviewing the assessment and plan with the patient's wife, son and coordinating care with EMS to take the patient to nearest ED for further evaluation and management.   Return if symptoms worsen or fail to improve, for After hospital d/c to discuss hospice .  Luke Shade, MD

## 2024-06-24 NOTE — ED Notes (Signed)
 This RN attempted to contact [name] wife to give requested update on pt's care with no answer.

## 2024-06-25 DIAGNOSIS — R54 Age-related physical debility: Secondary | ICD-10-CM | POA: Diagnosis not present

## 2024-06-25 DIAGNOSIS — E870 Hyperosmolality and hypernatremia: Secondary | ICD-10-CM | POA: Diagnosis not present

## 2024-06-25 DIAGNOSIS — Z681 Body mass index (BMI) 19 or less, adult: Secondary | ICD-10-CM | POA: Diagnosis not present

## 2024-06-25 DIAGNOSIS — Z7189 Other specified counseling: Secondary | ICD-10-CM | POA: Diagnosis not present

## 2024-06-25 DIAGNOSIS — G250 Essential tremor: Secondary | ICD-10-CM | POA: Diagnosis not present

## 2024-06-25 DIAGNOSIS — K219 Gastro-esophageal reflux disease without esophagitis: Secondary | ICD-10-CM | POA: Insufficient documentation

## 2024-06-25 DIAGNOSIS — G9340 Encephalopathy, unspecified: Secondary | ICD-10-CM | POA: Diagnosis not present

## 2024-06-25 DIAGNOSIS — F919 Conduct disorder, unspecified: Secondary | ICD-10-CM | POA: Diagnosis not present

## 2024-06-25 DIAGNOSIS — Z85828 Personal history of other malignant neoplasm of skin: Secondary | ICD-10-CM | POA: Diagnosis not present

## 2024-06-25 DIAGNOSIS — W06XXXA Fall from bed, initial encounter: Secondary | ICD-10-CM | POA: Diagnosis present

## 2024-06-25 DIAGNOSIS — F0153 Vascular dementia, unspecified severity, with mood disturbance: Secondary | ICD-10-CM | POA: Diagnosis not present

## 2024-06-25 DIAGNOSIS — Z515 Encounter for palliative care: Secondary | ICD-10-CM | POA: Diagnosis not present

## 2024-06-25 DIAGNOSIS — R627 Adult failure to thrive: Secondary | ICD-10-CM | POA: Diagnosis not present

## 2024-06-25 DIAGNOSIS — E873 Alkalosis: Secondary | ICD-10-CM | POA: Diagnosis not present

## 2024-06-25 DIAGNOSIS — E538 Deficiency of other specified B group vitamins: Secondary | ICD-10-CM | POA: Diagnosis not present

## 2024-06-25 DIAGNOSIS — E860 Dehydration: Secondary | ICD-10-CM | POA: Diagnosis not present

## 2024-06-25 DIAGNOSIS — Z66 Do not resuscitate: Secondary | ICD-10-CM | POA: Diagnosis not present

## 2024-06-25 DIAGNOSIS — R410 Disorientation, unspecified: Secondary | ICD-10-CM | POA: Diagnosis present

## 2024-06-25 DIAGNOSIS — Y92003 Bedroom of unspecified non-institutional (private) residence as the place of occurrence of the external cause: Secondary | ICD-10-CM | POA: Diagnosis not present

## 2024-06-25 DIAGNOSIS — G9341 Metabolic encephalopathy: Secondary | ICD-10-CM | POA: Diagnosis not present

## 2024-06-25 DIAGNOSIS — Z85820 Personal history of malignant melanoma of skin: Secondary | ICD-10-CM | POA: Diagnosis not present

## 2024-06-25 DIAGNOSIS — R0902 Hypoxemia: Secondary | ICD-10-CM | POA: Diagnosis not present

## 2024-06-25 DIAGNOSIS — S2242XA Multiple fractures of ribs, left side, initial encounter for closed fracture: Secondary | ICD-10-CM | POA: Diagnosis not present

## 2024-06-25 DIAGNOSIS — F32A Depression, unspecified: Secondary | ICD-10-CM | POA: Insufficient documentation

## 2024-06-25 DIAGNOSIS — E876 Hypokalemia: Secondary | ICD-10-CM | POA: Diagnosis not present

## 2024-06-25 DIAGNOSIS — R7989 Other specified abnormal findings of blood chemistry: Secondary | ICD-10-CM | POA: Diagnosis not present

## 2024-06-25 DIAGNOSIS — R636 Underweight: Secondary | ICD-10-CM | POA: Diagnosis not present

## 2024-06-25 DIAGNOSIS — N400 Enlarged prostate without lower urinary tract symptoms: Secondary | ICD-10-CM | POA: Diagnosis not present

## 2024-06-25 DIAGNOSIS — Z79899 Other long term (current) drug therapy: Secondary | ICD-10-CM | POA: Diagnosis not present

## 2024-06-25 LAB — CBC
HCT: 31.5 % — ABNORMAL LOW (ref 39.0–52.0)
Hemoglobin: 10.2 g/dL — ABNORMAL LOW (ref 13.0–17.0)
MCH: 28.8 pg (ref 26.0–34.0)
MCHC: 32.4 g/dL (ref 30.0–36.0)
MCV: 89 fL (ref 80.0–100.0)
Platelets: 355 K/uL (ref 150–400)
RBC: 3.54 MIL/uL — ABNORMAL LOW (ref 4.22–5.81)
RDW: 15.1 % (ref 11.5–15.5)
WBC: 6.2 K/uL (ref 4.0–10.5)
nRBC: 0 % (ref 0.0–0.2)

## 2024-06-25 LAB — BLOOD GAS, VENOUS
Bicarbonate: 21.1 mmol/L (ref 20.0–28.0)
O2 Saturation: 16.5 mmol/L (ref 0.0–2.0)
Patient temperature: 37
Patient temperature: 37
pCO2, Ven: 21 mmHg — ABNORMAL LOW (ref 44–60)
pH, Ven: 7.61 (ref 7.25–7.43)
pO2, Ven: <31 mmol/L — AB (ref 32–45)

## 2024-06-25 LAB — BASIC METABOLIC PANEL WITH GFR
Anion gap: 16 — ABNORMAL HIGH (ref 5–15)
BUN: 38 mg/dL — ABNORMAL HIGH (ref 8–23)
CO2: 18 mmol/L — ABNORMAL LOW (ref 22–32)
Calcium: 8.7 mg/dL — ABNORMAL LOW (ref 8.9–10.3)
Chloride: 115 mmol/L — ABNORMAL HIGH (ref 98–111)
Creatinine, Ser: 0.85 mg/dL (ref 0.61–1.24)
GFR, Estimated: >60 mL/min (ref >60)
Glucose, Bld: 135 mg/dL — ABNORMAL HIGH (ref 70–99)
Potassium: 3 mmol/L — ABNORMAL LOW (ref 3.5–5.1)
Sodium: 149 mmol/L — ABNORMAL HIGH (ref 135–145)

## 2024-06-25 MED ORDER — FENTANYL CITRATE PF 50 MCG/ML IJ SOSY
25.0000 ug | PREFILLED_SYRINGE | Freq: Once | INTRAMUSCULAR | Status: AC
  Administered 2024-06-25: 25 ug via INTRAVENOUS
  Filled 2024-06-25: qty 1

## 2024-06-25 MED ORDER — HALOPERIDOL LACTATE 5 MG/ML IJ SOLN
2.0000 mg | Freq: Four times a day (QID) | INTRAMUSCULAR | Status: DC | PRN
  Administered 2024-06-25: 2 mg via INTRAVENOUS
  Filled 2024-06-25 (×2): qty 1

## 2024-06-25 MED ORDER — DIPHENHYDRAMINE HCL 50 MG/ML IJ SOLN
25.0000 mg | Freq: Once | INTRAMUSCULAR | Status: AC
  Administered 2024-06-25: 25 mg via INTRAVENOUS
  Filled 2024-06-25: qty 1

## 2024-06-25 MED ORDER — MORPHINE SULFATE (PF) 2 MG/ML IV SOLN
2.0000 mg | INTRAVENOUS | Status: DC | PRN
  Administered 2024-06-25 – 2024-06-26 (×2): 2 mg via INTRAVENOUS
  Filled 2024-06-25 (×2): qty 1

## 2024-06-25 MED ORDER — SODIUM CHLORIDE 0.9 % IV SOLN: INTRAVENOUS | Status: AC

## 2024-06-25 MED ORDER — ENOXAPARIN SODIUM 40 MG/0.4ML IJ SOSY
40.0000 mg | PREFILLED_SYRINGE | INTRAMUSCULAR | Status: DC
  Administered 2024-06-25 – 2024-06-27 (×3): 40 mg via SUBCUTANEOUS
  Filled 2024-06-25 (×3): qty 0.4

## 2024-06-25 NOTE — Assessment & Plan Note (Signed)
-   The patient was having delirium of unclear etiology. - He will be admitted to a medical telemetry observation bed. - This could be related to dementia with behavioral disturbance. - Will continue Seroquel  and place him on as needed IM Haldol  and IV Ativan . - With persistent agitation roommate utilize as needed fentanyl  and Benadryl  if he becomes a danger to himself.

## 2024-06-25 NOTE — Assessment & Plan Note (Signed)
-   His potassium will be replaced.

## 2024-06-25 NOTE — Assessment & Plan Note (Signed)
-

## 2024-06-25 NOTE — Progress Notes (Signed)
 PROGRESS NOTE    Rodney Branch   FMW:[id number] DOB: 1931-01-07  DOA: 06/24/2024 Date of Service: 06/25/24 which is hospital day 0  Rodney Branch: Rodney Branch, No    Hospital course / significant events:   HPI: Rodney Branch is a 89 y.o. Caucasian male with medical history significant for depression, osteoarthritis, history of malignant melanoma, and subdural hematoma secondary to fall last month, who presented to the emergency room with acute onset of altered mental status with confusion and failure to thrive, per family had not eaten or taken medications in  days.   07/28: to ED. Admitted w/ dehydration and continued on IV fluids, new dx rib fractures (son states recent fall out of bed).  07/29: pt remains confused and intermittently agitated. SPoke w/ family, son would like to treat the treatable for now but ultimately would like DNR and discuss w/ hospice, palliative consult placed and anticipate hospice liaison pending GOC discussion      Consultants:  Palliative care   Procedures/Surgeries: none      ASSESSMENT & PLAN:   Acute encephalopathy Likely advancing vascular dementia Per discussion w/ son, recent dedcline fits with vascular dementia as well as microvascular ischemic change  Seroquel   as needed IM Haldol  and IV Ativan .   Hypernatremia  Rehydrate Monitor BMP  Hypokalemia Replace as needed Monitor BMP   Dehydration manifested by azotemia and mild hypernatremia. IV fluids    GERD without esophagitis PPI   Vitamin B12 deficiency Supplement    Depression Paxil  and Seroquel .   Benign familial tremor primidone .  Rib fractures nondisplaced Pain control as needed   Advanced care planning Son requests for DNR and states family is in agreement on this Palliative care consult  Goal is for family to continue care at home     underweight based on BMI: Body mass index is 17.51 kg/m.SABRA Significantly low or high BMI is associated with higher medical risk.   Underweight - under 18  overweight - 25 to 29 obese - 30 or more Class 1 obesity: BMI of 30.0 to 34 Class 2 obesity: BMI of 35.0 to 39 Class 3 obesity: BMI of 40.0 to 49 Super Morbid Obesity: BMI 50-59 Super-super Morbid Obesity: BMI 60+ Healthy nutrition and physical activity advised as adjunct to other disease management and risk reduction treatments    DVT prophylaxis: lovenox   IV fluids: NS continuous IV fluids  Nutrition: regualr Central lines / other devices: none  Code Status: DNR updated this afternoon after speaking w/ son Rodney Branch ACP documentation reviewed: none on file in [facility]  TOC needs: TBD Medical barriers to dispo: GOC w/palliative, correction of dehydration. Expected medical readiness for discharge 1-2 days posisbly w/ hospice              Subjective / Brief ROS:  Patient alert but only says yeah and ok to everythign  Family Communication: spoke on phone w/ son Rodney Branch today     Objective Findings:  Vitals:   06/25/24 0021 06/25/24 0133 06/25/24 0137 06/25/24 0648  BP:  (!) 176/81 (!) 166/88 (!) 156/91  Pulse:  94 93 (!) 108  Resp:   17   Temp: 99.1 F (37.3 C)  98.2 F (36.8 C) 97.9 F (36.6 C)  TempSrc: Axillary  Oral Oral  SpO2:  100%  100%  Weight:      Height:        Intake/Output Summary (Last 24 hours) at 06/25/2024 1837 Last data filed at 06/25/2024 1026 Gross per 24 hour  Intake 1088.33 ml  Output --  Net 1088.33 ml   Filed Weights   06/24/24 1451  Weight: 49.2 kg    Examination:  Physical Exam Constitutional:      General: He is not in acute distress.    Appearance: He is ill-appearing.     Comments: Very frail  Cardiovascular:     Rate and Rhythm: Normal rate and regular rhythm.  Pulmonary:     Effort: Pulmonary effort is normal.     Breath sounds: Normal breath sounds.  Abdominal:     Palpations: Abdomen is soft.  Musculoskeletal:     Right lower leg: No edema.     Left lower leg: No edema.  Skin:     General: Skin is dry.  Neurological:     Mental Status: He is alert. He is disoriented.          Scheduled Medications:   cholecalciferol   2,000 Units Oral Daily   cyanocobalamin   1,000 mcg Oral Daily   doxazosin   8 mg Oral Daily   enoxaparin  (LOVENOX ) injection  40 mg Subcutaneous Q24H   ferrous sulfate   325 mg Oral Q breakfast   PARoxetine   30 mg Oral q morning   primidone   150 mg Oral QHS   QUEtiapine   600 mg Oral QHS    Continuous Infusions:  sodium chloride       PRN Medications:  acetaminophen  **OR** acetaminophen , haloperidol  lactate, magnesium  hydroxide, morphine  injection, ondansetron  **OR** ondansetron  (ZOFRAN ) IV, polyethylene glycol, traZODone   Antimicrobials from admission:  Anti-infectives (From admission, onward)    None           Data Reviewed:  I have personally reviewed the following...  CBC: Recent Labs  Lab 06/24/24 1451 06/25/24 0308  WBC 5.5 6.2  NEUTROABS 3.8  --   HGB 11.1* 10.2*  HCT 32.9* 31.5*  MCV 87.7 89.0  PLT 384 355   Basic Metabolic Panel: Recent Labs  Lab 06/24/24 1451 06/25/24 0308  NA 146* 149*  K 3.2* 3.0*  CL 112* 115*  CO2 20* 18*  GLUCOSE 152* 135*  BUN 37* 38*  CREATININE 1.16 0.85  CALCIUM 9.2 8.7*  MG 2.2  --    GFR: Estimated Creatinine Clearance: 39.4 mL/min (by C-G formula based on SCr of 0.85 mg/dL). Liver Function Tests: Recent Labs  Lab 06/24/24 1451  AST 25  ALT 13  ALKPHOS 96  BILITOT 1.1  PROT 6.4*  ALBUMIN 3.4*   No results for input(s): LIPASE, AMYLASE in the last 168 hours. No results for input(s): AMMONIA in the last 168 hours. Coagulation Profile: No results for input(s): INR, PROTIME in the last 168 hours. Cardiac Enzymes: No results for input(s): CKTOTAL, CKMB, CKMBINDEX, TROPONINI in the last 168 hours. BNP (last 3 results) No results for input(s): PROBNP in the last 8760 hours. HbA1C: No results for input(s): HGBA1C in the last 72  hours. CBG: No results for input(s): GLUCAP in the last 168 hours. Lipid Profile: No results for input(s): CHOL, HDL, LDLCALC, TRIG, CHOLHDL, LDLDIRECT in the last 72 hours. Thyroid  Function Tests: No results for input(s): TSH, T4TOTAL, FREET4, T3FREE, THYROIDAB in the last 72 hours. Anemia Panel: No results for input(s): VITAMINB12, FOLATE, FERRITIN, TIBC, IRON, RETICCTPCT in the last 72 hours. Most Recent Urinalysis On File:     Component Value Date/Time   COLORURINE AMBER (A) 06/24/2024 2200   APPEARANCEUR CLEAR (A) 06/24/2024 2200   LABSPEC 1.027 06/24/2024 2200   PHURINE 6.0 06/24/2024 2200   GLUCOSEU  50 (A) 06/24/2024 2200   HGBUR SMALL (A) 06/24/2024 2200   BILIRUBINUR NEGATIVE 06/24/2024 2200   KETONESUR 20 (A) 06/24/2024 2200   PROTEINUR 100 (A) 06/24/2024 2200   UROBILINOGEN 1.0 12/10/2014 1644   NITRITE NEGATIVE 06/24/2024 2200   LEUKOCYTESUR NEGATIVE 06/24/2024 2200   Sepsis Labs: @LABRCNTIP (procalcitonin:4,lacticidven:4) Microbiology: No results found for this or any previous visit (from the past 240 hours).    Radiology Studies last 3 days: CT Angio Chest PE W/Cm &/Or Wo Cm Result Date: 06/24/2024 CLINICAL DATA:  Pulmonary embolism (PE) suspected, high prob EXAM: CT ANGIOGRAPHY CHEST WITH CONTRAST TECHNIQUE: Multidetector CT imaging of the chest was performed using the standard protocol during bolus administration of intravenous contrast. Multiplanar CT image reconstructions and MIPs were obtained to evaluate the vascular anatomy. RADIATION DOSE REDUCTION: This exam was performed according to the departmental dose-optimization program which includes automated exposure control, adjustment of the mA and/or kV according to patient size and/or use of iterative reconstruction technique. CONTRAST:  75mL OMNIPAQUE  IOHEXOL  350 MG/ML SOLN COMPARISON:  06/24/2024 FINDINGS: Pulmonary Embolism: No pulmonary embolism. Cardiovascular: No  cardiomegaly. Small pericardial effusion. Scattered calcified coronary atherosclerosis.Fusiform dilation of the ascending aorta measuring 4 cm. Calcified atherosclerosis throughout the aorta. Mediastinum/Nodes: No mediastinal mass. No mediastinal, hilar, or axillary lymphadenopathy. Small hiatal hernia. Lungs/Pleura: The midline trachea and bronchi are patent. Small left pleural effusion. No pneumothorax. Posterior bibasilar dependent atelectasis. Fibrolinear scarring in the right middle lobe. Musculoskeletal: Nondisplaced fractures of the posterior left tenth and eleventh ribs. Severe right glenohumeral joint osteoarthritis. Diffuse osteopenia. A couple of remote, healed right-sided rib fractures. Multilevel degenerative disc disease of the spine. Thoracic DISH. Upper Abdomen: No acute abnormality in the partially visualized upper abdomen. Review of the MIP images confirms the above findings. IMPRESSION: 1. Nondisplaced fractures of the posterior left tenth and eleventh ribs. No pneumothorax. 2. Small left pleural effusion. 3. No pulmonary embolism. Aortic Atherosclerosis (ICD10-I70.0). Electronically Signed   By: Rogelia Myers M.D.   On: 06/24/2024 21:09   CT Head Wo Contrast Result Date: 06/24/2024 CLINICAL DATA:  Altered mental status EXAM: CT HEAD WITHOUT CONTRAST TECHNIQUE: Contiguous axial images were obtained from the base of the skull through the vertex without intravenous contrast. RADIATION DOSE REDUCTION: This exam was performed according to the departmental dose-optimization program which includes automated exposure control, adjustment of the mA and/or kV according to patient size and/or use of iterative reconstruction technique. COMPARISON:  06/18/2024 FINDINGS: Brain: No evidence of acute infarction, hemorrhage, hydrocephalus, extra-axial collection or mass lesion/mass effect. Chronic atrophic and ischemic changes are noted. Previously seen left-sided subdural hematoma is no longer identified.  Vascular: No hyperdense vessel or unexpected calcification. Skull: Normal. Negative for fracture or focal lesion. Sinuses/Orbits: No acute finding. Other: None. IMPRESSION: Chronic atrophic and ischemic changes without acute abnormality. Electronically Signed   By: Oneil Devonshire M.D.   On: 06/24/2024 21:01   DG Chest Port 1 View Result Date: 06/24/2024 CLINICAL DATA:  Shortness of breath. EXAM: PORTABLE CHEST 1 VIEW COMPARISON:  10/31/2023. FINDINGS: Low lung volume. Mild diffuse increased interstitial markings, likely accentuated by low lung volume. Bilateral lung fields are clear. No acute consolidation or lung collapse. Note is made of mildly elevated right hemidiaphragm. Bilateral costophrenic angles are clear. Normal cardio-mediastinal silhouette. No acute osseous abnormalities. The soft tissues are within normal limits. IMPRESSION: No active disease. Electronically Signed   By: Ree Molt M.D.   On: 06/24/2024 15:19         Calton Harshfield, DO  [facility] 06/25/2024, 6:37 PM    Dictation software may have been used to generate the above note. Typos may occur and escape review in typed/dictated notes. Please contact Dr Marsa directly for clarity if needed.  Staff may message me via secure chat in Epic  but this may not receive an immediate response,  please page me for urgent matters!  If 7PM-7AM, please contact night coverage [email or web address]

## 2024-06-25 NOTE — Assessment & Plan Note (Signed)
Will continue vitamin B12 ?

## 2024-06-25 NOTE — Care Management Obs Status (Signed)
 MEDICARE OBSERVATION STATUS NOTIFICATION   Patient Details  Name: Rodney Branch MRN: [id number] Date of Birth: Nov 16, 1931   Medicare Observation Status Notification Given:  Chaney BRANDY CHRISTIANE LELON, CMA 06/25/2024, 4:04 PM

## 2024-06-25 NOTE — ED Notes (Signed)
Pt to the floor.

## 2024-06-25 NOTE — Plan of Care (Signed)
 Patient alert to self only. VSS. Agitated, pulled out IV. Pulled at lines, removed gown multiple times and removed telemetry several times. Telemetyry- NSR. Oral care administered. Patient had 3 BMs, pericare administered. Patient did not void- Bladder scanned= 93 ml. Safety sitter in place. Patient eventually was able to rest. Safety measures in place throughout the night.   Problem: Safety: Goal: Ability to remain free from injury will improve Outcome: Progressing

## 2024-06-25 NOTE — Plan of Care (Signed)
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
## 2024-06-25 NOTE — Assessment & Plan Note (Signed)
-   Will continue his Paxil  and Seroquel .

## 2024-06-25 NOTE — Assessment & Plan Note (Signed)
-   This is manifested by azotemia and mild hypernatremia.

## 2024-06-25 NOTE — Hospital Course (Addendum)
 Hospital course / significant events:   HPI: Rodney Branch is a 89 y.o. Caucasian male with medical history significant for depression, osteoarthritis, history of malignant melanoma, and subdural hematoma secondary to fall last month, who presented to the emergency room with acute onset of altered mental status with confusion and failure to thrive, per family had not eaten or taken medications in  days.   07/28: to ED. Admitted w/ dehydration and continued on IV fluids, new dx rib fractures (son states recent fall out of bed).  07/29: pt remains confused and intermittently agitated. SPoke w/ family, son would like to treat the treatable for now but ultimately would like DNR and discuss w/ hospice, palliative consult placed and anticipate hospice liaison pending GOC discussion      Consultants:  Palliative care   Procedures/Surgeries: none      ASSESSMENT & PLAN:   Acute encephalopathy Likely advancing vascular dementia Per discussion w/ son, recent dedcline fits with vascular dementia as well as microvascular ischemic change  Seroquel   as needed IM Haldol  and IV Ativan .   Hypernatremia  Rehydrate Monitor BMP  Hypokalemia Replace as needed Monitor BMP   Dehydration manifested by azotemia and mild hypernatremia. IV fluids    GERD without esophagitis PPI   Vitamin B12 deficiency Supplement    Depression Paxil  and Seroquel .   Benign familial tremor primidone .  Rib fractures nondisplaced Pain control as needed   Advanced care planning Son requests for DNR and states family is in agreement on this Palliative care consult  Goal is for family to continue care at home     underweight based on BMI: Body mass index is 17.51 kg/m.SABRA Significantly low or high BMI is associated with higher medical risk.  Underweight - under 18  overweight - 25 to 29 obese - 30 or more Class 1 obesity: BMI of 30.0 to 34 Class 2 obesity: BMI of 35.0 to 39 Class 3 obesity: BMI of  40.0 to 49 Super Morbid Obesity: BMI 50-59 Super-super Morbid Obesity: BMI 60+ Healthy nutrition and physical activity advised as adjunct to other disease management and risk reduction treatments    DVT prophylaxis: lovenox   IV fluids: NS continuous IV fluids  Nutrition: regualr Central lines / other devices: none  Code Status: DNR updated this afternoon after speaking w/ son Rodney Branch ACP documentation reviewed: none on file in [facility]  TOC needs: TBD Medical barriers to dispo: GOC w/palliative, correction of dehydration. Expected medical readiness for discharge 1-2 days posisbly w/ hospice

## 2024-06-25 NOTE — Assessment & Plan Note (Signed)
-   Will continue his primidone .

## 2024-06-26 DIAGNOSIS — G9340 Encephalopathy, unspecified: Secondary | ICD-10-CM | POA: Diagnosis not present

## 2024-06-26 LAB — CBC
HCT: 30.2 % — ABNORMAL LOW (ref 39.0–52.0)
Hemoglobin: 9.4 g/dL — ABNORMAL LOW (ref 13.0–17.0)
MCH: 28.6 pg (ref 26.0–34.0)
MCHC: 31.1 g/dL (ref 30.0–36.0)
MCV: 91.8 fL (ref 80.0–100.0)
Platelets: 312 K/uL (ref 150–400)
RBC: 3.29 MIL/uL — ABNORMAL LOW (ref 4.22–5.81)
RDW: 15.5 % (ref 11.5–15.5)
WBC: 3.8 K/uL — ABNORMAL LOW (ref 4.0–10.5)
nRBC: 0 % (ref 0.0–0.2)

## 2024-06-26 LAB — BASIC METABOLIC PANEL WITH GFR
Anion gap: 7 (ref 5–15)
BUN: 35 mg/dL — ABNORMAL HIGH (ref 8–23)
CO2: 24 mmol/L (ref 22–32)
Calcium: 8.4 mg/dL — ABNORMAL LOW (ref 8.9–10.3)
Chloride: 119 mmol/L — ABNORMAL HIGH (ref 98–111)
Creatinine, Ser: 0.76 mg/dL (ref 0.61–1.24)
GFR, Estimated: >60 mL/min (ref >60)
Glucose, Bld: 95 mg/dL (ref 70–99)
Potassium: 3.5 mmol/L (ref 3.5–5.1)
Sodium: 150 mmol/L — ABNORMAL HIGH (ref 135–145)

## 2024-06-26 MED ORDER — DEXTROSE 5 % IV SOLN
INTRAVENOUS | Status: DC
  Filled 2024-06-26: qty 1000

## 2024-06-26 NOTE — Plan of Care (Signed)
 PMT consult noted.  Into see patient who denies complaint.  Sitter is at bedside.  No family in the room.  Attempted to call son on but was unable to reach him.  Will reattempt tomorrow.

## 2024-06-26 NOTE — Progress Notes (Signed)
  Progress Note   Patient: Rodney Branch FMW:[id number] DOB: Oct 14, 1931 DOA: 06/24/2024     1 DOS: the patient was seen and examined on 06/26/2024   Brief hospital course: HPI: Rodney Branch is a 89 y.o. Caucasian male with medical history significant for depression, osteoarthritis, history of malignant melanoma, and subdural hematoma secondary to fall last month, who presented to the emergency room with acute onset of altered mental status with confusion and failure to thrive, per family had not eaten or taken medications in  days.    07/28: to ED. Admitted w/ dehydration and continued on IV fluids, new dx rib fractures (son states recent fall out of bed).  07/29: pt remains confused and intermittently agitated. SPoke w/ family, son would like to treat the treatable for now but ultimately would like DNR and discuss w/ hospice, palliative consult placed and anticipate hospice liaison pending GOC discussion      Acute encephalopathy Likely advancing vascular dementia Per discussion w/ son, recent dedcline fits with vascular dementia as well as microvascular ischemic change  Seroquel   as needed IM Haldol  and IV Ativan .   Hypernatremia  Rehydrate Monitor BMP   Hypokalemia Replace as needed Monitor BMP   Dehydration manifested by azotemia and mild hypernatremia. IV fluids    GERD without esophagitis PPI   Vitamin B12 deficiency Supplement    Depression Paxil  and Seroquel .   Benign familial tremor primidone .   Rib fractures nondisplaced Pain control as needed   Advanced care planning Son requests for DNR and states family is in agreement on this Palliative care consult  Goal is for family to continue care at home   Subjective: He denies any pain, fever or nausea vomiting  Physical Exam: Vitals:   06/25/24 2111 06/26/24 0527 06/26/24 1234 06/26/24 1559  BP: 136/75 (!) 118/57 (!) 160/82 139/77  Pulse: (!) 102 74 80 84  Resp: 19 19 16 17   Temp: 98.1 F (36.7 C)  98.7  F (37.1 C) 98.2 F (36.8 C)  TempSrc:      SpO2:  100% 98% 100%  Weight:      Height:       Constitutional: Alert, awake, calm, comfortable HEENT: Neck supple Respiratory: clear to auscultation bilaterally, no wheezing, no crackles. Normal respiratory effort. No accessory muscle use.  Cardiovascular: Regular rate and rhythm, no murmurs / rubs / gallops. No extremity edema. 2+ pedal pulses. No carotid bruits.  Abdomen: no tenderness, no masses palpated. No hepatosplenomegaly. Bowel sounds positive.  Musculoskeletal: no clubbing / cyanosis. No joint deformity upper and lower extremities. Good ROM, no contractures. Normal muscle tone.  Skin: no rashes, lesions, ulcers. No induration Neurologic: CN 2-12 grossly intact. Sensation intact, DTR normal. Strength 5/5 x all 4 extremities.    Data Reviewed:  Sodium 150  Family Communication: None  Disposition: Status is: Inpatient Remains inpatient appropriate because: Ongoing care for hypernatremia  Planned Discharge Destination: Home with home hospice    Time spent: 35 minutes  Author: Nena Rebel, MD 06/26/2024 5:38 PM  For on call review [email or web address].

## 2024-06-26 NOTE — Plan of Care (Signed)
  Problem: Clinical Measurements: Goal: Will remain free from infection Outcome: Progressing   Problem: Clinical Measurements: Goal: Respiratory complications will improve Outcome: Progressing   Problem: Clinical Measurements: Goal: Cardiovascular complication will be avoided Outcome: Progressing   Problem: Activity: Goal: Risk for activity intolerance will decrease Outcome: Progressing   Problem: Nutrition: Goal: Adequate nutrition will be maintained Outcome: Progressing   Problem: Coping: Goal: Level of anxiety will decrease Outcome: Progressing   

## 2024-06-27 DIAGNOSIS — Z7189 Other specified counseling: Secondary | ICD-10-CM

## 2024-06-27 DIAGNOSIS — G9340 Encephalopathy, unspecified: Secondary | ICD-10-CM | POA: Diagnosis not present

## 2024-06-27 LAB — BASIC METABOLIC PANEL WITH GFR
Anion gap: 4 — ABNORMAL LOW (ref 5–15)
BUN: 26 mg/dL — ABNORMAL HIGH (ref 8–23)
CO2: 25 mmol/L (ref 22–32)
Calcium: 7.7 mg/dL — ABNORMAL LOW (ref 8.9–10.3)
Chloride: 112 mmol/L — ABNORMAL HIGH (ref 98–111)
Creatinine, Ser: 0.69 mg/dL (ref 0.61–1.24)
GFR, Estimated: >60 mL/min (ref >60)
Glucose, Bld: 124 mg/dL — ABNORMAL HIGH (ref 70–99)
Potassium: 2.9 mmol/L — ABNORMAL LOW (ref 3.5–5.1)
Sodium: 141 mmol/L (ref 135–145)

## 2024-06-27 MED ORDER — MORPHINE SULFATE (PF) 2 MG/ML IV SOLN: 2.0000 mg | INTRAVENOUS | Status: DC | PRN

## 2024-06-27 MED ORDER — CHLORHEXIDINE GLUCONATE CLOTH 2 % EX PADS
6.0000 | MEDICATED_PAD | Freq: Every day | CUTANEOUS | Status: DC
  Administered 2024-06-27 – 2024-06-28 (×2): 6 via TOPICAL

## 2024-06-27 MED ORDER — POTASSIUM CHLORIDE 10 MEQ/100ML IV SOLN
10.0000 meq | INTRAVENOUS | Status: AC
  Administered 2024-06-27 (×4): 10 meq via INTRAVENOUS
  Filled 2024-06-27 (×2): qty 100

## 2024-06-27 MED ORDER — OXYCODONE HCL 5 MG PO TABS: 2.5000 mg | ORAL_TABLET | ORAL | Status: DC | PRN

## 2024-06-27 NOTE — Progress Notes (Signed)
 [facility], [facility] 147[facility] Liaison Note  Received request from Racheal Schimke, RN, Transitions of Care Manager, for hospice services at home after discharge.  Spoke with son to initiate education related to hospice philosophy, services, and team approach to care. Son verbalized understanding of information given.  Per discussion, the plan is for discharge home by private vehicle tomorrow morning.    DME needs discussed.  Patient has the following equipment in the home: WC, Walker and tub chair.    Patient/family requests the following equipment in the home:  Hospital bed and over the bed table.  DME does NOT have to be in the home before patient discharges, according to son.    The address has been verified and is correct in the chart. Rodney Branch and phone number [phone] is the family contact to arrange time of equipment delivery.    Please send signed and completed DNR home with the patient/family.  Please provide prescriptions at discharge as needed to ensure ongoing symptom management.   [facility] information and contact numbers given to son.  Above information shared with Racheal Schimke, RN, Transitions of Care Manager.     Please call with any Hospice related questions or concerns.  Thank you for the opportunity to participate in this patient's care.  Rodney Branch, [facility] Liaison [phone]

## 2024-06-27 NOTE — Progress Notes (Signed)
  Progress Note   Patient: Rodney Branch FMW:[id number] DOB: 04-Dec-1930 DOA: 06/24/2024     2 DOS: the patient was seen and examined on 06/27/2024   Brief hospital course: Hospital course / significant events:     HPI: Rodney Branch is a 89 y.o. Caucasian male with medical history significant for depression, osteoarthritis, history of malignant melanoma, and subdural hematoma secondary to fall last month, who presented to the emergency room with acute onset of altered mental status with confusion and failure to thrive, per family had not eaten or taken medications in  days.   07/28: to ED. Admitted w/ dehydration and continued on IV fluids, new dx rib fractures (son states recent fall out of bed).  07/29: pt remains confused and intermittently agitated. SPoke w/ family, son would like to treat the treatable for now but ultimately would like DNR and discuss w/ hospice, palliative consult placed and anticipate hospice liaison pending GOC discussion  7/31: Attempting to speak with the patient's son, [name], not able to speak with him.  Contacted TOC they are working to find a place to send him.     Acute encephalopathy - The patient was having delirium of unclear etiology. - He will be admitted to a medical telemetry observation bed. - This could be related to dementia with behavioral disturbance. - Will continue Seroquel  and place him on as needed IM Haldol  and IV Ativan . - With persistent agitation roommate utilize as needed fentanyl  and Benadryl  if he becomes a danger to himself. -He has been on hospice, being planned for home with home hospice.   Hypokalemia - His potassium will be replaced.   Dehydration - This is manifested by azotemia and mild hypernatremia. -Resolved   GERD without esophagitis - We will continue his PPI therapy.   Vitamin B12 deficiency - Will continue vitamin B12.   Depression - Will continue his Paxil  and Seroquel .   Benign familial tremor - Will continue  his primidone .        Subjective: denies pain  Physical Exam: Vitals:   06/26/24 2003 06/27/24 0405 06/27/24 0710 06/27/24 1453  BP: 131/69 114/64 126/60 135/75  Pulse: 81 74 68 79  Resp: 18 20 17 17   Temp: 98.5 F (36.9 C) 97.7 F (36.5 C) 97.9 F (36.6 C) 98.1 F (36.7 C)  TempSrc: Oral Oral Oral   SpO2: 100% 97% 98% 100%  Weight:      Height:       Constitutional: Alert, awake, calm, comfortable HEENT: Neck supple Respiratory: clear to auscultation bilaterally, no wheezing, no crackles. Normal respiratory effort. No accessory muscle use.  Cardiovascular: Regular rate and rhythm, no murmurs / rubs / gallops. No extremity edema. 2+ pedal pulses. No carotid bruits.  Abdomen: no tenderness, no masses palpated. No hepatosplenomegaly. Bowel sounds positive.  Musculoskeletal: no clubbing / cyanosis. No joint deformity upper and lower extremities. Good ROM, no contractures. Normal muscle tone.  Skin: no rashes, lesions, ulcers. No induration Neurologic: CN 2-12 grossly intact. Sensation intact, DTR normal. Strength 5/5 x all 4 extremities.   Data Reviewed:  There are no new results to review at this time.  Family Communication: None, tried to call son but did not pick up the phone  Disposition: Status is: Inpatient Remains inpatient appropriate because: Placement with hospice  Planned Discharge Destination: Home with home hospice    Time spent: 35 minutes  Author: Mikahla Wisor, MD 06/27/2024 3:37 PM  For on call review [email or web address].

## 2024-06-27 NOTE — Progress Notes (Signed)
 Due to patient retaining urine, MD ordered Foley catheter to be inserted. This RN called patient's son [name] to obtain his consent for catheter to be placed. Son agreed to cathter being placed, stating whatever makes him comfortable.. RN will proceed with catheter insertion.
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
## 2024-06-28 ENCOUNTER — Inpatient Hospital Stay: Payer: PPO

## 2024-06-28 ENCOUNTER — Inpatient Hospital Stay: Payer: PPO | Admitting: Internal Medicine

## 2024-06-28 DIAGNOSIS — G9340 Encephalopathy, unspecified: Secondary | ICD-10-CM | POA: Diagnosis not present

## 2024-06-28 MED ORDER — ACETAMINOPHEN 325 MG PO TABS: 650.0000 mg | ORAL_TABLET | Freq: Four times a day (QID) | ORAL | 0 refills | Status: DC | PRN

## 2024-06-28 MED ORDER — QUETIAPINE FUMARATE 300 MG PO TABS: 600.0000 mg | ORAL_TABLET | Freq: Every day | ORAL | 0 refills | Status: DC

## 2024-06-28 MED ORDER — OXYCODONE HCL 5 MG PO TABS: 2.5000 mg | ORAL_TABLET | ORAL | 0 refills | Status: DC | PRN

## 2024-06-28 MED ORDER — ENSURE PLUS HIGH PROTEIN PO LIQD
237.0000 mL | Freq: Two times a day (BID) | ORAL | Status: DC
  Administered 2024-06-28: 237 mL via ORAL

## 2024-06-28 NOTE — TOC Transition Note (Signed)
 Transition of Care [facility]) - Discharge Note   Patient Details  Name: Rodney Branch MRN: [id number] Date of Birth: 04/23/1931  Transition of Care [facility]) CM/SW Contact:  Racheal LITTIE Schimke, RN Phone Number: 06/28/2024, 12:46 PM   Final next level of care: Long Term Acute Care (LTAC) Barriers to Discharge: Barriers Resolved   Patient Goals and CMS Choice Patient states their goals for this hospitalization and ongoing recovery are:: Return to [facility] with [facility] CMS Medicare.gov Compare Post Acute Care list provided to:: Patient Represenative (must comment) (Son [name]) Choice offered to / list presented to : Adult Children     Name of family member notified: [name] Patient and family notified of of transfer: 06/27/24  Discharge Plan and Services Additional resources added to the After Visit Summary for              DME Arranged: Hospice Equipment Package C DME Agency: Other - Comment [facility]) Date DME Agency Contacted: 06/27/24   Representative spoke with at DME Agency: [facility] Arranged: NA HH Agency: NA   Social Drivers of Health (SDOH) Interventions SDOH Screenings   Food Insecurity: Patient Unable To Answer (06/25/2024)  Housing: Patient Unable To Answer (06/25/2024)  Transportation Needs: Unmet Transportation Needs (06/25/2024)  Utilities: Patient Unable To Answer (06/25/2024)  Alcohol Screen: Low Risk  (07/10/2023)  Depression (PHQ2-9): Low Risk  (01/26/2024)  Financial Resource Strain: Low Risk  (07/10/2023)  Physical Activity: Inactive (07/10/2023)  Social Connections: Unknown (06/25/2024)  Stress: No Stress Concern Present (07/10/2023)  Tobacco Use: Low Risk  (06/24/2024)     Readmission Risk Interventions     No data to display

## 2024-06-28 NOTE — Progress Notes (Signed)
 Discharge instructions given to patients son Renay, questions answered. Per MD foley catheter to remain at discharge until hospice sees patient on Monday. IV removed without complications. Patient transporting home via car by son Kenon Delashmit.

## 2024-06-28 NOTE — Discharge Summary (Addendum)
 Physician Discharge Summary   Patient: Rodney Branch MRN: [id number] DOB: [birth year]  Admit date:     06/24/2024  Discharge date: 06/28/24  Discharge Physician: Nena Rebel   PCP: Pcp, No   Recommendations at discharge:    Home hospice  Discharge Diagnoses: Principal Problem:   Acute encephalopathy Active Problems:   Hypokalemia   Dehydration   Benign familial tremor   Depression   Vitamin B12 deficiency   GERD without esophagitis Metabolic encephalopathy  POA Resolved Problems:   * No resolved hospital problems. [facility] Course: Hospital course / significant events:     HPI: Rodney Branch is a 89 y.o. Caucasian male with medical history significant for depression, osteoarthritis, history of malignant melanoma, and subdural hematoma secondary to fall last month, who presented to the emergency room with acute onset of altered mental status with confusion and failure to thrive, per family had not eaten or taken medications in  days.   07/28: to ED. Admitted w/ dehydration and continued on IV fluids, new dx rib fractures (son states recent fall out of bed).  07/29: pt remains confused and intermittently agitated. SPoke w/ family, son would like to treat the treatable for now but ultimately would like DNR and discuss w/ hospice, palliative consult placed and anticipate hospice liaison pending GOC discussion  7/31: Attempting to speak with the patient's son, [name], not able to speak with him.  Contacted TOC they are working to find a place to send him. I was told everything ready for home hospice and getting discharged home with hospice.       Assessment and Plan:  Acute encephalopathy:  The patient was having delirium of unclear etiology. - He will be admitted to a medical telemetry observation bed. - This could be related to dementia with behavioral disturbance. - Will continue Seroquel  and place him on as needed IM Haldol  and IV Ativan . - With persistent agitation  roommate utilize as needed fentanyl  and Benadryl  if he becomes a danger to himself. -He has been on hospice, being planned for discharge home with home hospice.   Hypokalemia - His potassium will be replaced.   Dehydration - This is manifested by azotemia and mild hypernatremia. -Resolved   GERD without esophagitis - We will continue his PPI therapy.   Vitamin B12 deficiency - Will continue vitamin B12.   Depression - Will continue his Paxil  and Seroquel .   Benign familial tremor - Will continue his primidone .             Consultants: Palliative care Procedures performed: None  Disposition: Hospice care Diet recommendation:  Regular diet DISCHARGE MEDICATION: Allergies as of 06/28/2024       Reactions   Metoprolol Tartrate Other (See Comments)   Hallucinations   Propranolol    Hallucinations (resolved upon cessation)        Medication List     TAKE these medications    acetaminophen  325 MG tablet Commonly known as: TYLENOL  Take 2 tablets (650 mg total) by mouth every 6 (six) hours as needed for mild pain (pain score 1-3) or fever (or Fever >/= 101).   cyanocobalamin  1000 MCG tablet Commonly known as: VITAMIN B12 Take 1,000 mcg by mouth daily.   doxazosin  8 MG tablet Commonly known as: CARDURA  Take 1 tablet (8 mg total) by mouth daily.   ferrous sulfate  325 (65 FE) MG tablet Take 325 mg by mouth daily with breakfast.   omeprazole  20 MG capsule Commonly known as: PRILOSEC  Take 1 capsule by mouth once daily   oxyCODONE  5 MG immediate release tablet Commonly known as: Oxy IR/ROXICODONE  Take 0.5-1 tablets (2.5-5 mg total) by mouth every 4 (four) hours as needed for moderate pain (pain score 4-6) or severe pain (pain score 7-10).   PARoxetine  30 MG tablet Commonly known as: PAXIL  TAKE 1 TABLET BY MOUTH IN THE MORNING   polyethylene glycol powder 17 GM/SCOOP powder Commonly known as: GLYCOLAX /MIRALAX  Take 17 g by mouth daily as needed.    primidone  50 MG tablet Commonly known as: MYSOLINE  TAKE 3 TABLETS BY MOUTH AT BEDTIME   QUEtiapine  300 MG tablet Commonly known as: SEROQUEL  TAKE 2 TABLETS BY MOUTH AT BEDTIME   Vitamin D  50 MCG (2000 UT) Caps Take 1 capsule by mouth daily.        Discharge Exam: Filed Weights   06/24/24 1451  Weight: 49.2 kg   He is sleepy. Clear lungs, No leg edema.   Condition at discharge: poor  The results of significant diagnostics from this hospitalization (including imaging, microbiology, ancillary and laboratory) are listed below for reference.   Imaging Studies: CT Angio Chest PE W/Cm &/Or Wo Cm Result Date: 06/24/2024 CLINICAL DATA:  Pulmonary embolism (PE) suspected, high prob EXAM: CT ANGIOGRAPHY CHEST WITH CONTRAST TECHNIQUE: Multidetector CT imaging of the chest was performed using the standard protocol during bolus administration of intravenous contrast. Multiplanar CT image reconstructions and MIPs were obtained to evaluate the vascular anatomy. RADIATION DOSE REDUCTION: This exam was performed according to the departmental dose-optimization program which includes automated exposure control, adjustment of the mA and/or kV according to patient size and/or use of iterative reconstruction technique. CONTRAST:  75mL OMNIPAQUE  IOHEXOL  350 MG/ML SOLN COMPARISON:  06/24/2024 FINDINGS: Pulmonary Embolism: No pulmonary embolism. Cardiovascular: No cardiomegaly. Small pericardial effusion. Scattered calcified coronary atherosclerosis.Fusiform dilation of the ascending aorta measuring 4 cm. Calcified atherosclerosis throughout the aorta. Mediastinum/Nodes: No mediastinal mass. No mediastinal, hilar, or axillary lymphadenopathy. Small hiatal hernia. Lungs/Pleura: The midline trachea and bronchi are patent. Small left pleural effusion. No pneumothorax. Posterior bibasilar dependent atelectasis. Fibrolinear scarring in the right middle lobe. Musculoskeletal: Nondisplaced fractures of the posterior  left tenth and eleventh ribs. Severe right glenohumeral joint osteoarthritis. Diffuse osteopenia. A couple of remote, healed right-sided rib fractures. Multilevel degenerative disc disease of the spine. Thoracic DISH. Upper Abdomen: No acute abnormality in the partially visualized upper abdomen. Review of the MIP images confirms the above findings. IMPRESSION: 1. Nondisplaced fractures of the posterior left tenth and eleventh ribs. No pneumothorax. 2. Small left pleural effusion. 3. No pulmonary embolism. Aortic Atherosclerosis (ICD10-I70.0). Electronically Signed   By: Rogelia Myers M.D.   On: 06/24/2024 21:09   CT Head Wo Contrast Result Date: 06/24/2024 CLINICAL DATA:  Altered mental status EXAM: CT HEAD WITHOUT CONTRAST TECHNIQUE: Contiguous axial images were obtained from the base of the skull through the vertex without intravenous contrast. RADIATION DOSE REDUCTION: This exam was performed according to the departmental dose-optimization program which includes automated exposure control, adjustment of the mA and/or kV according to patient size and/or use of iterative reconstruction technique. COMPARISON:  06/18/2024 FINDINGS: Brain: No evidence of acute infarction, hemorrhage, hydrocephalus, extra-axial collection or mass lesion/mass effect. Chronic atrophic and ischemic changes are noted. Previously seen left-sided subdural hematoma is no longer identified. Vascular: No hyperdense vessel or unexpected calcification. Skull: Normal. Negative for fracture or focal lesion. Sinuses/Orbits: No acute finding. Other: None. IMPRESSION: Chronic atrophic and ischemic changes without acute abnormality. Electronically Signed   By:  Oneil Devonshire M.D.   On: 06/24/2024 21:01   DG Chest Port 1 View Result Date: 06/24/2024 CLINICAL DATA:  Shortness of breath. EXAM: PORTABLE CHEST 1 VIEW COMPARISON:  10/31/2023. FINDINGS: Low lung volume. Mild diffuse increased interstitial markings, likely accentuated by low lung volume.  Bilateral lung fields are clear. No acute consolidation or lung collapse. Note is made of mildly elevated right hemidiaphragm. Bilateral costophrenic angles are clear. Normal cardio-mediastinal silhouette. No acute osseous abnormalities. The soft tissues are within normal limits. IMPRESSION: No active disease. Electronically Signed   By: Ree Molt M.D.   On: 06/24/2024 15:19   CT HEAD WO CONTRAST ( ) Result Date: 06/18/2024 CLINICAL DATA:  Head trauma, minor (Age >= 65y); Neck trauma (Age >= 65y) EXAM: CT HEAD WITHOUT CONTRAST CT CERVICAL SPINE WITHOUT CONTRAST TECHNIQUE: Multidetector CT imaging of the head and cervical spine was performed following the standard protocol without intravenous contrast. Multiplanar CT image reconstructions of the cervical spine were also generated. RADIATION DOSE REDUCTION: This exam was performed according to the departmental dose-optimization program which includes automated exposure control, adjustment of the mA and/or kV according to patient size and/or use of iterative reconstruction technique. COMPARISON:  CT head 06/12/2024 trauma CT head and C-spine 05/18/2024. FINDINGS: CT HEAD FINDINGS Brain: Cerebral ventricle sizes are concordant with the degree of cerebral volume loss. No evidence of large-territorial acute infarction. No parenchymal hemorrhage. No mass lesion. No acute extra-axial collection. Interval decrease in size of a 0.2 cm (from 0.5 cm) subacute to chronic left subdural hematoma. No mass effect or midline shift. No hydrocephalus. Basilar cisterns are patent. Vascular: No hyperdense vessel. Skull: No acute fracture or focal lesion. Sinuses/Orbits: Paranasal sinuses and mastoid air cells are clear. Bilateral lens replacement. Otherwise the orbits are unremarkable. Other: None. CT CERVICAL SPINE FINDINGS Alignment: Normal. Skull base and vertebrae: Multilevel mild-to-moderate degenerative changes of the spine. No severe osseous neural foraminal or central  canal stenosis. No acute fracture. No aggressive appearing focal osseous lesion or focal pathologic process. Soft tissues and spinal canal: No prevertebral fluid or swelling. No visible canal hematoma. Upper chest: Unremarkable. Other: None. IMPRESSION: 1. No acute intracranial abnormality. 2. Interval decrease in size of a 0.2 cm (from 0.5 cm) subacute to chronic left subdural hematoma. 3. No acute displaced fracture or traumatic listhesis of the cervical spine. Electronically Signed   By: Morgane  Naveau M.D.   On: 06/18/2024 00:52   CT Cervical Spine Wo Contrast Result Date: 06/18/2024 CLINICAL DATA:  Head trauma, minor (Age >= 65y); Neck trauma (Age >= 65y) EXAM: CT HEAD WITHOUT CONTRAST CT CERVICAL SPINE WITHOUT CONTRAST TECHNIQUE: Multidetector CT imaging of the head and cervical spine was performed following the standard protocol without intravenous contrast. Multiplanar CT image reconstructions of the cervical spine were also generated. RADIATION DOSE REDUCTION: This exam was performed according to the departmental dose-optimization program which includes automated exposure control, adjustment of the mA and/or kV according to patient size and/or use of iterative reconstruction technique. COMPARISON:  CT head 06/12/2024 trauma CT head and C-spine 05/18/2024. FINDINGS: CT HEAD FINDINGS Brain: Cerebral ventricle sizes are concordant with the degree of cerebral volume loss. No evidence of large-territorial acute infarction. No parenchymal hemorrhage. No mass lesion. No acute extra-axial collection. Interval decrease in size of a 0.2 cm (from 0.5 cm) subacute to chronic left subdural hematoma. No mass effect or midline shift. No hydrocephalus. Basilar cisterns are patent. Vascular: No hyperdense vessel. Skull: No acute fracture or focal lesion. Sinuses/Orbits: Paranasal sinuses  and mastoid air cells are clear. Bilateral lens replacement. Otherwise the orbits are unremarkable. Other: None. CT CERVICAL SPINE  FINDINGS Alignment: Normal. Skull base and vertebrae: Multilevel mild-to-moderate degenerative changes of the spine. No severe osseous neural foraminal or central canal stenosis. No acute fracture. No aggressive appearing focal osseous lesion or focal pathologic process. Soft tissues and spinal canal: No prevertebral fluid or swelling. No visible canal hematoma. Upper chest: Unremarkable. Other: None. IMPRESSION: 1. No acute intracranial abnormality. 2. Interval decrease in size of a 0.2 cm (from 0.5 cm) subacute to chronic left subdural hematoma. 3. No acute displaced fracture or traumatic listhesis of the cervical spine. Electronically Signed   By: Morgane  Naveau M.D.   On: 06/18/2024 00:52   CT HEAD WO CONTRAST ( ) Result Date: 06/14/2024 CLINICAL DATA:  Subdural hematoma.  Follow-up. EXAM: CT HEAD WITHOUT CONTRAST TECHNIQUE: Contiguous axial images were obtained from the base of the skull through the vertex without intravenous contrast. RADIATION DOSE REDUCTION: This exam was performed according to the departmental dose-optimization program which includes automated exposure control, adjustment of the mA and/or kV according to patient size and/or use of iterative reconstruction technique. COMPARISON:  05/18/2024 FINDINGS: Brain: Reduction and maximal thickness of low-density subdural fluid along the lateral convexity on the left, maximal thickness now 4 mm compared with 7 mm previously. No new or enlarging collection. Reduction in mass effect with left-to-right shift now of only 3 mm. Generalized brain atrophy without evidence of acute stroke or mass. Vascular: There is atherosclerotic calcification of the major vessels at the base of the brain. Skull: Negative Sinuses/Orbits: Clear/normal Other: None IMPRESSION: Reduction in maximal thickness of low-density subdural fluid along the lateral convexity on the left, maximal thickness now 4 mm compared with 7 mm previously. No new or enlarging collection.  Reduction in mass effect with left-to-right shift now of only 3 mm. Electronically Signed   By: Oneil Officer M.D.   On: 06/14/2024 17:02    Microbiology: Results for orders placed or performed during the hospital encounter of 10/31/23  Group A Strep by PCR     Status: None   Collection Time: 10/31/23  3:36 PM   Specimen: Throat; Sterile Swab  Result Value Ref Range Status   Group A Strep by PCR NOT DETECTED NOT DETECTED Final    Comment: Performed at [facility], [address] [place], [place] [address]  Resp panel by RT-PCR (RSV, Flu A&B, Covid) Anterior Nasal Swab     Status: None   Collection Time: 10/31/23  3:36 PM   Specimen: Anterior Nasal Swab  Result Value Ref Range Status   SARS Coronavirus 2 by RT PCR NEGATIVE NEGATIVE Final    Comment: (NOTE) SARS-CoV-2 target nucleic acids are NOT DETECTED.  The SARS-CoV-2 RNA is generally detectable in upper respiratory specimens during the acute phase of infection. The lowest concentration of SARS-CoV-2 viral copies this assay can detect is 138 copies/mL. A negative result does not preclude SARS-Cov-2 infection and should not be used as the sole basis for treatment or other patient management decisions. A negative result may occur with  improper specimen collection/handling, submission of specimen other than nasopharyngeal swab, presence of viral mutation(s) within the areas targeted by this assay, and inadequate number of viral copies(<138 copies/mL). A negative result must be combined with clinical observations, patient history, and epidemiological information. The expected result is Negative.  Fact Sheet for Patients:  BloggerCourse.com  Fact Sheet for Healthcare Providers:  SeriousBroker.it  This test is  no t yet approved or cleared by the [place]  FDA and  has been authorized for detection and/or diagnosis of SARS-CoV-2 by FDA under an Emergency Use  Authorization (EUA). This EUA will remain  in effect (meaning this test can be used) for the duration of the COVID-19 declaration under Section 564(b)(1) of the Act, 21 U.S.C.section 360bbb-3(b)(1), unless the authorization is terminated  or revoked sooner.       Influenza A by PCR NEGATIVE NEGATIVE Final   Influenza B by PCR NEGATIVE NEGATIVE Final    Comment: (NOTE) The Xpert Xpress SARS-CoV-2/FLU/RSV plus assay is intended as an aid in the diagnosis of influenza from Nasopharyngeal swab specimens and should not be used as a sole basis for treatment. Nasal washings and aspirates are unacceptable for Xpert Xpress SARS-CoV-2/FLU/RSV testing.  Fact Sheet for Patients: BloggerCourse.com  Fact Sheet for Healthcare Providers: SeriousBroker.it  This test is not yet approved or cleared by the [place]  FDA and has been authorized for detection and/or diagnosis of SARS-CoV-2 by FDA under an Emergency Use Authorization (EUA). This EUA will remain in effect (meaning this test can be used) for the duration of the COVID-19 declaration under Section 564(b)(1) of the Act, 21 U.S.C. section 360bbb-3(b)(1), unless the authorization is terminated or revoked.     Resp Syncytial Virus by PCR NEGATIVE NEGATIVE Final    Comment: (NOTE) Fact Sheet for Patients: BloggerCourse.com  Fact Sheet for Healthcare Providers: SeriousBroker.it  This test is not yet approved or cleared by the [place]  FDA and has been authorized for detection and/or diagnosis of SARS-CoV-2 by FDA under an Emergency Use Authorization (EUA). This EUA will remain in effect (meaning this test can be used) for the duration of the COVID-19 declaration under Section 564(b)(1) of the Act, 21 U.S.C. section 360bbb-3(b)(1), unless the authorization is terminated or revoked.  Performed at [facility], [address] [place], [place] [address]     Labs: CBC: Recent Labs  Lab 06/24/24 1451 06/25/24 0308 06/26/24 0556  WBC 5.5 6.2 3.8*  NEUTROABS 3.8  --   --   HGB 11.1* 10.2* 9.4*  HCT 32.9* 31.5* 30.2*  MCV 87.7 89.0 91.8  PLT 384 355 312   Basic Metabolic Panel: Recent Labs  Lab 06/24/24 1451 06/25/24 0308 06/26/24 0556 06/27/24 0403  NA 146* 149* 150* 141  K 3.2* 3.0* 3.5 2.9*  CL 112* 115* 119* 112*  CO2 20* 18* 24 25  GLUCOSE 152* 135* 95 124*  BUN 37* 38* 35* 26*  CREATININE 1.16 0.85 0.76 0.69  CALCIUM 9.2 8.7* 8.4* 7.7*  MG 2.2  --   --   --    Liver Function Tests: Recent Labs  Lab 06/24/24 1451  AST 25  ALT 13  ALKPHOS 96  BILITOT 1.1  PROT 6.4*  ALBUMIN 3.4*   CBG: No results for input(s): GLUCAP in the last 168 hours.  Discharge time spent: greater than 30 minutes.  Signed: Nena Rebel, MD [facility] 06/28/2024

## 2024-06-28 NOTE — TOC Transition Note (Signed)
 Transition of Care [facility]) - Discharge Note   Patient Details  Name: Rodney Branch MRN: [id number] Date of Birth: 09-19-1931  [facility]) CM/SW Contact:  Racheal LITTIE Schimke, RN Phone Number: 06/28/2024, 12:30 PM   Clinical Narrative: To discharge home with son [name] and [employer]. Son to transport.    Final next level of care: Home w Hospice Care Barriers to Discharge: Barriers Resolved   Patient Goals and CMS Choice   CMS Medicare.gov Compare Post Acute Care list provided to:: Patient Represenative (must comment) (Son [name]) Choice offered to / list presented to : Adult Children      Discharge Placement                  Name of family member notified: [name] Patient and family notified of of transfer: 06/27/24  Discharge Plan and Services Additional resources added to the After Visit Summary for                  DME Arranged: Hospice Equipment Package C DME Agency: Other - Comment [facility]) Date DME Agency Contacted: 06/27/24   Representative spoke with at DME Agency: [place]            Social Drivers of Health (SDOH) Interventions SDOH Screenings   Food Insecurity: Patient Unable To Answer (06/25/2024)  Housing: Patient Unable To Answer (06/25/2024)  Transportation Needs: Unmet Transportation Needs (06/25/2024)  Utilities: Patient Unable To Answer (06/25/2024)  Alcohol Screen: Low Risk  (07/10/2023)  Depression (PHQ2-9): Low Risk  (01/26/2024)  Financial Resource Strain: Low Risk  (07/10/2023)  Physical Activity: Inactive (07/10/2023)  Social Connections: Unknown (06/25/2024)  Stress: No Stress Concern Present (07/10/2023)  Tobacco Use: Low Risk  (06/24/2024)     Readmission Risk Interventions     No data to display

## 2024-06-28 NOTE — Care Management Important Message (Signed)
 Important Message  Patient Details  Name: Rodney Branch MRN: [id number] Date of Birth: July 13, 1931   Important Message Given:  Yes - Medicare IM     Ahmar Pickrell W, CMA 06/28/2024, 10:57 AM

## 2024-07-02 ENCOUNTER — Inpatient Hospital Stay: Attending: Internal Medicine

## 2024-07-02 ENCOUNTER — Inpatient Hospital Stay: Admitting: Internal Medicine

## 2024-07-02 ENCOUNTER — Encounter: Payer: Self-pay | Admitting: Internal Medicine

## 2024-07-25 ENCOUNTER — Telehealth: Payer: Self-pay

## 2024-07-25 NOTE — Telephone Encounter (Signed)
 Sending back to you per request. I have never seen this pt.

## 2024-07-25 NOTE — Telephone Encounter (Signed)
 Copied from CRM #[id number]. Topic: General - Deceased Patient >> 08/12/24 12:24 PM Harlene ORN wrote: Name of caller: Tanda Cable  Date of death: 08/01/2024   Name of funeral home: [facility]  Phone number of funeral home: [phone]  Provider that needs to sign form: n/a  Timeline for signing: unknown

## 2024-07-29 DEATH — deceased

## 2024-07-30 ENCOUNTER — Encounter

## 2024-07-30 ENCOUNTER — Ambulatory Visit: Admitting: Psychiatry

## 2024-07-30 ENCOUNTER — Ambulatory Visit: Payer: PPO | Admitting: Family Medicine

## 2024-07-30 ENCOUNTER — Encounter: Payer: PPO | Admitting: Family Medicine

## 2024-07-30 NOTE — Telephone Encounter (Signed)
 CRM sent to wrong clinic initially. Pt scheduled to see Dr. Abbey for [facility] today and Destiny, CMA found note concerning call from family reporting that patient had passed away. After chart review assisted Destiny in marking patient as deceased in EPIC.
